# Patient Record
Sex: Female | Born: 1940 | Race: White | Hispanic: No | Marital: Married | Smoking: Former smoker
Health system: Southern US, Community
[De-identification: ages and names within clinical notes are randomized; demographics above are authoritative.]

## PROBLEM LIST (undated history)

## (undated) DIAGNOSIS — K219 Gastro-esophageal reflux disease without esophagitis: Secondary | ICD-10-CM

## (undated) DIAGNOSIS — Z9289 Personal history of other medical treatment: Secondary | ICD-10-CM

## (undated) DIAGNOSIS — M316 Other giant cell arteritis: Secondary | ICD-10-CM

## (undated) DIAGNOSIS — I2510 Atherosclerotic heart disease of native coronary artery without angina pectoris: Secondary | ICD-10-CM

## (undated) DIAGNOSIS — E785 Hyperlipidemia, unspecified: Secondary | ICD-10-CM

## (undated) DIAGNOSIS — E039 Hypothyroidism, unspecified: Secondary | ICD-10-CM

## (undated) DIAGNOSIS — H5462 Unqualified visual loss, left eye, normal vision right eye: Secondary | ICD-10-CM

## (undated) HISTORY — DX: Atherosclerotic heart disease of native coronary artery without angina pectoris: I25.10

## (undated) HISTORY — DX: Personal history of other medical treatment: Z92.89

## (undated) HISTORY — DX: Other giant cell arteritis: M31.6

## (undated) HISTORY — DX: Unqualified visual loss, left eye, normal vision right eye: H54.62

---

## 1999-03-24 ENCOUNTER — Other Ambulatory Visit: Admission: RE | Admit: 1999-03-24 | Discharge: 1999-03-24 | Payer: Self-pay | Admitting: Family Medicine

## 2001-08-26 ENCOUNTER — Ambulatory Visit (HOSPITAL_COMMUNITY): Admission: RE | Admit: 2001-08-26 | Discharge: 2001-08-26 | Payer: Self-pay | Admitting: Endocrinology

## 2002-01-06 ENCOUNTER — Other Ambulatory Visit: Admission: RE | Admit: 2002-01-06 | Discharge: 2002-01-06 | Payer: Self-pay | Admitting: Obstetrics and Gynecology

## 2002-01-08 ENCOUNTER — Encounter: Admission: RE | Admit: 2002-01-08 | Discharge: 2002-01-08 | Payer: Self-pay | Admitting: Obstetrics and Gynecology

## 2002-01-08 ENCOUNTER — Encounter: Payer: Self-pay | Admitting: Obstetrics and Gynecology

## 2005-06-15 ENCOUNTER — Ambulatory Visit: Payer: Self-pay | Admitting: Internal Medicine

## 2005-07-05 ENCOUNTER — Ambulatory Visit: Payer: Self-pay | Admitting: Internal Medicine

## 2005-09-26 ENCOUNTER — Encounter: Admission: RE | Admit: 2005-09-26 | Discharge: 2005-09-26 | Payer: Self-pay | Admitting: Obstetrics and Gynecology

## 2006-07-20 ENCOUNTER — Ambulatory Visit (HOSPITAL_COMMUNITY): Admission: RE | Admit: 2006-07-20 | Discharge: 2006-07-20 | Payer: Self-pay | Admitting: Specialist

## 2006-08-23 ENCOUNTER — Encounter: Admission: RE | Admit: 2006-08-23 | Discharge: 2006-08-23 | Payer: Self-pay | Admitting: Specialist

## 2007-03-22 ENCOUNTER — Ambulatory Visit: Payer: Self-pay | Admitting: Vascular Surgery

## 2007-11-20 ENCOUNTER — Encounter: Admission: RE | Admit: 2007-11-20 | Discharge: 2007-11-20 | Payer: Self-pay | Admitting: Endocrinology

## 2008-03-13 ENCOUNTER — Encounter: Admission: RE | Admit: 2008-03-13 | Discharge: 2008-03-13 | Payer: Self-pay | Admitting: Endocrinology

## 2008-03-23 ENCOUNTER — Encounter: Payer: Self-pay | Admitting: Family Medicine

## 2008-03-30 ENCOUNTER — Encounter: Admission: RE | Admit: 2008-03-30 | Discharge: 2008-03-30 | Payer: Self-pay | Admitting: Endocrinology

## 2008-04-19 ENCOUNTER — Encounter: Admission: RE | Admit: 2008-04-19 | Discharge: 2008-04-19 | Payer: Self-pay | Admitting: Endocrinology

## 2008-09-01 ENCOUNTER — Encounter: Admission: RE | Admit: 2008-09-01 | Discharge: 2008-09-01 | Payer: Self-pay | Admitting: Obstetrics and Gynecology

## 2009-09-27 ENCOUNTER — Encounter: Admission: RE | Admit: 2009-09-27 | Discharge: 2009-09-27 | Payer: Self-pay | Admitting: Endocrinology

## 2010-12-04 ENCOUNTER — Encounter: Payer: Self-pay | Admitting: Specialist

## 2010-12-04 ENCOUNTER — Encounter: Payer: Self-pay | Admitting: Endocrinology

## 2013-07-18 ENCOUNTER — Other Ambulatory Visit: Payer: Self-pay | Admitting: Endocrinology

## 2013-07-18 DIAGNOSIS — Z1231 Encounter for screening mammogram for malignant neoplasm of breast: Secondary | ICD-10-CM

## 2013-08-15 ENCOUNTER — Ambulatory Visit: Payer: Self-pay

## 2013-09-06 ENCOUNTER — Other Ambulatory Visit: Payer: Self-pay | Admitting: Interventional Cardiology

## 2014-06-24 ENCOUNTER — Other Ambulatory Visit: Payer: Self-pay | Admitting: Interventional Cardiology

## 2014-07-23 ENCOUNTER — Telehealth: Payer: Self-pay | Admitting: Interventional Cardiology

## 2014-07-23 NOTE — Telephone Encounter (Signed)
pt sts that she was prescribed meloxicam  daily x2wks for a pinched nerve. adv her that it was ok to take with her current cardiac medications.pt verbalized understanding.

## 2014-07-23 NOTE — Telephone Encounter (Signed)
New message    Patient calling meloxicam 15 mg . Patient was given by her PCP. Please advise if she could take it or not.

## 2014-08-22 ENCOUNTER — Other Ambulatory Visit: Payer: Self-pay | Admitting: Interventional Cardiology

## 2014-08-25 ENCOUNTER — Other Ambulatory Visit: Payer: Self-pay | Admitting: Interventional Cardiology

## 2014-09-18 ENCOUNTER — Telehealth: Payer: Self-pay

## 2014-09-18 ENCOUNTER — Other Ambulatory Visit: Payer: Self-pay | Admitting: Interventional Cardiology

## 2014-09-24 MED ORDER — ATENOLOL 25 MG PO TABS: ORAL_TABLET | ORAL | Status: DC

## 2014-09-24 NOTE — Telephone Encounter (Signed)
Pt past due for f/u with Dr.Smith appt sch for 1/12 @1 :15pm.pt adv to keep appt to continue to receive  medication refills

## 2014-11-24 ENCOUNTER — Encounter: Payer: Self-pay | Admitting: Interventional Cardiology

## 2014-11-24 ENCOUNTER — Ambulatory Visit (INDEPENDENT_AMBULATORY_CARE_PROVIDER_SITE_OTHER): Payer: Medicare Other | Admitting: Interventional Cardiology

## 2014-11-24 VITALS — BP 134/74 | HR 61 | Ht 63.0 in | Wt 198.0 lb

## 2014-11-24 DIAGNOSIS — I10 Essential (primary) hypertension: Secondary | ICD-10-CM | POA: Insufficient documentation

## 2014-11-24 DIAGNOSIS — E785 Hyperlipidemia, unspecified: Secondary | ICD-10-CM | POA: Insufficient documentation

## 2014-11-24 DIAGNOSIS — I2510 Atherosclerotic heart disease of native coronary artery without angina pectoris: Secondary | ICD-10-CM | POA: Insufficient documentation

## 2014-11-24 MED ORDER — ROSUVASTATIN CALCIUM 10 MG PO TABS: 10.0000 mg | ORAL_TABLET | Freq: Every day | ORAL | Status: DC

## 2014-11-24 NOTE — Progress Notes (Signed)
Patient ID: Jody Shaw, female   DOB: 10-01-1941, 74 y.o.   MRN: [id number]    [address], [address] [place], [place]  [address] Phone: [phone] Fax:  [phone]  Date:  11/24/2014   ID:  Jody Shaw, DOB August 18, 1941, MRN [id number]  PCP:  Julian Hy, MD   ASSESSMENT:  1.  Coronary artery disease, stable without angina  2. Essential hypertension, borderline control  3. Hyperlipidemia followed by Dr. Evlyn Kanner  PLAN:  1.  Monitor blood pressures at home an phone in results.  2. Clinical follow-up in one year  3. Blood pressure remains greater than 140/91 need to increase losartan HCT to 100/12.5 mg    SUBJECTIVE: Jody Shaw is a 74 y.o. female  who is doing well. She is somewhat distraught because her youngest grandchild who has leukemia and has now started having seizures. She denies angina. She has not had orthopnea PND. She has a blood pressure monitor at home but doesn't check her blood pressure. She denies orthopnea. No exertional related chest discomfort.    Wt Readings from Last 3 Encounters:  11/24/14 198 lb (89.812 kg)     Past Medical History  Diagnosis Date  . Coronary artery disease     Current Outpatient Prescriptions  Medication Sig Dispense Refill  . aspirin 81 MG tablet Take 81 mg by mouth 3 (three) times a week.    Marland Kitchen atenolol (TENORMIN) 25 MG tablet TAKE ONE TABLET BY MOUTH ONE TIME DAILY 30 tablet 2  . b complex vitamins capsule Take 1 capsule by mouth daily.    . isosorbide mononitrate (IMDUR) 60 MG 24 hr tablet Take 60 mg by mouth daily.    . lansoprazole (PREVACID) 15 MG capsule Take 15 mg by mouth daily at 12 noon.    Marland Kitchen levothyroxine (SYNTHROID, LEVOTHROID) 88 MCG tablet Take 88 mcg by mouth daily before breakfast.    . losartan-hydrochlorothiazide (HYZAAR) 50-12.5 MG per tablet Take 1 tablet by mouth daily.    . Multiple Vitamin (MULTI VITAMIN DAILY) TABS Take by mouth.    . nitroGLYCERIN (NITROSTAT) 0.4 MG SL tablet  Place 0.4 mg under the tongue every 5 (five) minutes as needed for chest pain.    . rosuvastatin (CRESTOR) 10 MG tablet Take 10 mg by mouth daily.    . Vitamin D, Ergocalciferol, (DRISDOL) 50000 UNITS CAPS capsule Take 50,000 Units by mouth every 7 (seven) days.     No current facility-administered medications for this visit.    Allergies:    Allergies  Allergen Reactions  . Iodine   . Septra [Sulfamethoxazole-Trimethoprim]   . Sulfa Antibiotics   . Watermelon [Citrullus Vulgaris]     Social History:  The patient  reports that she has never smoked. She does not have any smokeless tobacco history on file.   ROS:  Please see the history of present illness.    No edema. Denies orthopnea. No syncope or palpitations.   All other systems reviewed and negative.   OBJECTIVE: VS:  BP 134/74 mmHg  Pulse 61  Ht  (1.6 m)  Wt 198 lb (89.812 kg)  BMI 35.08 kg/m2 Well nourished, well developed, in no acute distress,  Mildly obese and elderly HEENT: normal Neck: JVD  flat. Carotid bruit  absent  Cardiac:  normal S1, S2; RRR; no murmur Lungs:  clear to auscultation bilaterally, no wheezing, rhonchi or rales Abd: soft, nontender, no hepatomegaly Ext: Edema  Trace bilateral. Pulses  2+ and  symmetric Skin: warm and dry Neuro:  CNs 2-12 intact, no focal abnormalities noted  EKG:   Sinus bradycardia, right bundle branch block       Signed, Darci NeedleHenry W. B. Smith III, MD 11/24/2014 1:35 PM

## 2014-11-24 NOTE — Patient Instructions (Signed)
Your physician recommends that you continue on your current medications as directed. Please refer to the Current Medication list given to you today. PLEASE MONITOR YOUR BLOOD PRESSURE AT HOME. Keep a list of daily readings, call or email Dr. Katrinka BlazingSmith in 3-4 weeks with results. If your readings are consistently greater than 140/90 Dr. Katrinka BlazingSmith may increase your blood pressure medication.

## 2014-11-25 ENCOUNTER — Telehealth: Payer: Self-pay | Admitting: Interventional Cardiology

## 2014-11-25 ENCOUNTER — Telehealth: Payer: Self-pay | Admitting: *Deleted

## 2014-11-25 NOTE — Telephone Encounter (Signed)
Patient was seen yesterday and stated that her crestor was sent in with a sig of take one daily, but she has been taking one weekly and stated that she informed the assistant of this error on her med list. She would like to know if Dr Katrinka BlazingSmith intended to change her dose.  Please advise. Thanks, MI

## 2014-11-26 NOTE — Telephone Encounter (Signed)
Returned pt call. Adv her I will fwd Dr.Smitha message for clarification of her Crestor dosage. Pt has been taking Crestor 10mg  once a wk. She sts that she has not been able to tolerate higher doses. She sts that she has had labs drawn with her pcp and was told her lipid numbers were ok. Adv her I will rqst a copy of her recent labs from her pcp, for Dr.Smith's review, I will fwd a message to Dr.Smith and call back with his response.  Spoke with Synetta FailAnita @ Dr.South's office. Pt last labs were in May 2015, she will fax them over attn:Dr.Smith

## 2014-11-27 ENCOUNTER — Encounter: Payer: Self-pay | Admitting: Interventional Cardiology

## 2014-12-01 NOTE — Telephone Encounter (Signed)
Follow up  Pt was calling to speak w/ Rn about BP - pt bought new bp kit; she said it was very painful, husband had helped her with the next reading but doesn't think it was accurate (she mentioned that 148/80 was a rough estimate) also- pt's PCP supposed to fax cholesterol levels and wanted to talk about that. Also-  She is exp[eriencing Possible sciatic nerve pain, wanted to see if that was cauasing the BP levels to inc. Please call back and discuss.

## 2014-12-01 NOTE — Telephone Encounter (Signed)
Returned pt call. Adv her that we have received a copy of her lab results from her pcp. We are waiting on Dr.Smith's response on clarification of her Crestor dosage. Pt sts that she purchased a bp machine, but does not think she was getting accurate readings because the cuff was to small. She returned the bp cuff and did not purchase another one. She was able to get one reading 148/80, but that squeeze from the cuff was unbearable. She has been having sciatic nerve pain, adv her that could be related to her elevated bp reading.adv her to monitor her bp 1-2 a wk, keep a bp log, and to call if her bp is consistently 140/90.I will call her back once I hear back from Dr.Smith about her Crestor

## 2014-12-02 ENCOUNTER — Encounter: Payer: Self-pay | Admitting: Interventional Cardiology

## 2014-12-08 MED ORDER — ROSUVASTATIN CALCIUM 10 MG PO TABS: 10.0000 mg | ORAL_TABLET | ORAL | Status: DC

## 2014-12-08 NOTE — Telephone Encounter (Signed)
Pt aware of Dr.Smith's recommendations.She needs Crestor 10 mg twice weekly. This would be a increase in therapy compared to her baseline dose. When her last laboratory evaluation was done in January her total cholesterol was 167 and the LDL was 99. By increasing the dose we should be able to get the LDL closer to 70.pt verbalized understanding.

## 2014-12-10 ENCOUNTER — Telehealth: Payer: Self-pay | Admitting: Interventional Cardiology

## 2014-12-10 NOTE — Telephone Encounter (Signed)
New message     Pt c/o BP issue: STAT if pt c/o blurred vision, one-sided weakness or slurred speech  1. What are your last 5 BP readings?151/95 checked today at [facility]  2. Are you having any other symptoms (ex. Dizziness, headache, blurred vision, passed out)? no 3. What is your BP issue? Pt had bp checked at [facility] today---the pharmacist advised pt to call her physician

## 2014-12-10 NOTE — Telephone Encounter (Signed)
Returned pt call. Pt sts that she has her bp checked today at a local pharmacy and it was 151/95. Dr.Smith aware. Adv her to continue to get her bp ck every other day, record readings, and call the office next wk to provide readings. Pt is asymptomatic and doing well. She is agreeable with plan and verbalized understanding.

## 2015-01-07 ENCOUNTER — Other Ambulatory Visit: Payer: Self-pay | Admitting: Interventional Cardiology

## 2015-03-17 ENCOUNTER — Other Ambulatory Visit: Payer: Self-pay | Admitting: Interventional Cardiology

## 2015-04-18 ENCOUNTER — Other Ambulatory Visit: Payer: Self-pay | Admitting: Interventional Cardiology

## 2015-05-24 ENCOUNTER — Other Ambulatory Visit: Payer: Self-pay | Admitting: Interventional Cardiology

## 2015-07-29 ENCOUNTER — Other Ambulatory Visit: Payer: Self-pay | Admitting: Interventional Cardiology

## 2015-08-30 ENCOUNTER — Telehealth: Payer: Self-pay | Admitting: Interventional Cardiology

## 2015-08-30 ENCOUNTER — Other Ambulatory Visit: Payer: Self-pay | Admitting: *Deleted

## 2015-08-30 MED ORDER — ATENOLOL 25 MG PO TABS: 25.0000 mg | ORAL_TABLET | Freq: Every day | ORAL | Status: DC

## 2015-08-30 NOTE — Telephone Encounter (Signed)
New message      STAT if patient is at the pharmacy , call can be transferred to refill team.   1. Which medications need to be refilled? Atenolol 25mg   2. Which pharmacy/location is medication to be sent to? [email or web address] [address] 3. Do they need a 30 day or 90 day supply? 90 day supply

## 2015-11-25 ENCOUNTER — Other Ambulatory Visit: Payer: Self-pay | Admitting: Interventional Cardiology

## 2015-11-26 ENCOUNTER — Other Ambulatory Visit: Payer: Self-pay | Admitting: *Deleted

## 2015-11-26 MED ORDER — ROSUVASTATIN CALCIUM 10 MG PO TABS: 10.0000 mg | ORAL_TABLET | ORAL | Status: DC

## 2016-01-11 ENCOUNTER — Other Ambulatory Visit: Payer: Self-pay | Admitting: Interventional Cardiology

## 2016-02-19 ENCOUNTER — Other Ambulatory Visit: Payer: Self-pay | Admitting: Interventional Cardiology

## 2016-02-25 ENCOUNTER — Telehealth: Payer: Self-pay | Admitting: Interventional Cardiology

## 2016-02-25 ENCOUNTER — Other Ambulatory Visit: Payer: Self-pay

## 2016-02-25 MED ORDER — ROSUVASTATIN CALCIUM 10 MG PO TABS: ORAL_TABLET | ORAL | Status: DC

## 2016-02-25 NOTE — Telephone Encounter (Signed)
°*  STAT* If patient is at the pharmacy, call can be transferred to refill team.   1. Which medications need to be refilled? (please list name of each medication and dose if known) Rosuvastatin 10mg   2. Which pharmacy/location (including street and city if local pharmacy) is medication to be sent to? [facility] [address]   3. Do they need a 30 day or 90 day supply? 30

## 2016-03-30 ENCOUNTER — Other Ambulatory Visit: Payer: Self-pay | Admitting: Interventional Cardiology

## 2016-04-03 ENCOUNTER — Other Ambulatory Visit: Payer: Self-pay | Admitting: Interventional Cardiology

## 2016-06-02 ENCOUNTER — Ambulatory Visit: Payer: Medicare Other | Admitting: Interventional Cardiology

## 2016-06-07 ENCOUNTER — Other Ambulatory Visit: Payer: Self-pay | Admitting: *Deleted

## 2016-06-07 MED ORDER — ROSUVASTATIN CALCIUM 10 MG PO TABS
ORAL_TABLET | ORAL | 2 refills | Status: DC
Start: 2016-06-07 — End: 2016-07-31

## 2016-07-07 ENCOUNTER — Other Ambulatory Visit: Payer: Self-pay | Admitting: Interventional Cardiology

## 2016-07-31 ENCOUNTER — Other Ambulatory Visit: Payer: Self-pay | Admitting: Interventional Cardiology

## 2016-08-11 ENCOUNTER — Encounter (INDEPENDENT_AMBULATORY_CARE_PROVIDER_SITE_OTHER): Payer: Self-pay

## 2016-08-11 ENCOUNTER — Encounter: Payer: Self-pay | Admitting: Interventional Cardiology

## 2016-08-11 ENCOUNTER — Ambulatory Visit (INDEPENDENT_AMBULATORY_CARE_PROVIDER_SITE_OTHER): Payer: Medicare Other | Admitting: Interventional Cardiology

## 2016-08-11 VITALS — BP 146/72 | HR 56 | Ht 63.0 in | Wt 186.0 lb

## 2016-08-11 DIAGNOSIS — I2510 Atherosclerotic heart disease of native coronary artery without angina pectoris: Secondary | ICD-10-CM

## 2016-08-11 DIAGNOSIS — E785 Hyperlipidemia, unspecified: Secondary | ICD-10-CM

## 2016-08-11 DIAGNOSIS — I10 Essential (primary) hypertension: Secondary | ICD-10-CM

## 2016-08-11 MED ORDER — ROSUVASTATIN CALCIUM 10 MG PO TABS: 10.0000 mg | ORAL_TABLET | ORAL | 11 refills | Status: DC

## 2016-08-11 NOTE — Progress Notes (Signed)
Cardiology Office Note    Date:  08/11/2016   ID:  Jody Shaw, DOB 12-18-1940, MRN [id number]  PCP:  Julian Hy, MD  Cardiologist: Lesleigh Noe, MD   Chief Complaint  Patient presents with  . Coronary Artery Disease    History of Present Illness:  Jody Shaw is a 75 y.o. female for follow-up of hyperlipidemia, hypertension, and coronary artery disease with prior LAD rotational atherectomy (remote).  Jody Shaw is doing well. She denies angina. She feels somewhat tired. There is no shortness of breath. She has not had palpitations or syncope. Is no peripheral edema.    Past Medical History:  Diagnosis Date  . Coronary artery disease     No past surgical history on file.  Current Medications: Outpatient Medications Prior to Visit  Medication Sig Dispense Refill  . aspirin 81 MG tablet Take 81 mg by mouth 3 (three) times a week.    Marland Kitchen atenolol (TENORMIN) 25 MG tablet TAKE 1 TABLET BY MOUTH EVERY DAY 90 tablet 0  . b complex vitamins capsule Take 1 capsule by mouth daily.    . isosorbide mononitrate (IMDUR) 60 MG 24 hr tablet Take 60 mg by mouth daily.    . lansoprazole (PREVACID) 15 MG capsule Take 15 mg by mouth daily at 12 noon.    Marland Kitchen levothyroxine (SYNTHROID, LEVOTHROID) 88 MCG tablet Take 88 mcg by mouth daily before breakfast.    . losartan-hydrochlorothiazide (HYZAAR) 50-12.5 MG per tablet Take 1 tablet by mouth daily.    . Multiple Vitamin (MULTI VITAMIN DAILY) TABS Take by mouth.    . nitroGLYCERIN (NITROSTAT) 0.4 MG SL tablet Place 0.4 mg under the tongue every 5 (five) minutes as needed for chest pain.    . rosuvastatin (CRESTOR) 10 MG tablet Take 1 tablet (10 mg total) by mouth 2 (two) times a week. 10 tablet 0  . Vitamin D, Ergocalciferol, (DRISDOL) 50000 UNITS CAPS capsule Take 50,000 Units by mouth every 7 (seven) days.     No facility-administered medications prior to visit.      Allergies:   Iodine; Septra  [sulfamethoxazole-trimethoprim]; Sulfa antibiotics; and Watermelon [citrullus vulgaris]   Social History   Social History  . Marital status: Married    Spouse name: N/A  . Number of children: N/A  . Years of education: N/A   Social History Main Topics  . Smoking status: Former Games developer  . Smokeless tobacco: Never Used  . Alcohol use None  . Drug use: Unknown  . Sexual activity: Not Asked   Other Topics Concern  . None   Social History Narrative  . None     Family History:  The patient's family history includes Heart disease in her father and mother.   ROS:   Please see the history of present illness.    Decreased hearing, left eye vision disturbance after cataract surgery. Recurring urinary tract infections. History of recent UTI. Back pain and muscle spasms. History of left leg sciatica.  All other systems reviewed and are negative.   PHYSICAL EXAM:   VS:  BP (!) 146/72   Pulse (!) 56   Ht 5\' 3"  (1.6 m)   Wt 186 lb (84.4 kg)   BMI 32.95 kg/m    GEN: Well nourished, well developed, in no acute distress. Mild obesity.  HEENT: normal  Neck: no JVD, carotid bruits, or masses Cardiac: RRR; no murmurs, rubs, or gallops,no edema  Respiratory:  clear to auscultation bilaterally, normal work of breathing  GI: soft, nontender, nondistended, + BS MS: no deformity or atrophy  Skin: warm and dry, no rash Neuro:  Alert and Oriented x 3, Strength and sensation are intact Psych: euthymic mood, full affect  Wt Readings from Last 3 Encounters:  08/11/16 186 lb (84.4 kg)  11/24/14 198 lb (89.8 kg)      Studies/Labs Reviewed:  * EKG:  EKG  Normal sinus rhythm, right bundle branch block, when compared to prior tracing, no significant changes occurred.  Recent Labs: No results found for requested labs within last 8760 hours.   Lipid Panel No results found for: CHOL, TRIG, HDL, CHOLHDL, VLDL, LDLCALC, LDLDIRECT  Additional studies/ records that were reviewed today include:    No new data    ASSESSMENT:    1. Essential hypertension   2. CAD in native artery   3. Hyperlipidemia      PLAN:  In order of problems listed above:  1. Borderline blood pressure control. Low-salt diet advocate it. Continue on Hyzaar 50/12.5 mg. Blood pressure remains elevated, we will need to up titrate losartan to 100/12.5 mg daily. 2. Continue isosorbide mononitrate. She has not ED use nitroglycerin. Aerobic exercise is recommended. 3. Monitor by Dr. Evlyn KannerSouth. Re-right the prescription for rosuvastatin 10 mg 3 times per week. No blood work is available in [employer].    Medication Adjustments/Labs and Tests Ordered: Current medicines are reviewed at length with the patient today.  Concerns regarding medicines are outlined above.  Medication changes, Labs and Tests ordered today are listed in the Patient Instructions below. There are no Patient Instructions on file for this visit.   Signed, Lesleigh NoeHenry W Smith III, MD  08/11/2016 12:25 PM    [facility] [address], [place], [place]  [address] Phone: [phone]) [phone]; Fax: [phone]

## 2016-08-11 NOTE — Patient Instructions (Signed)

## 2016-10-02 ENCOUNTER — Other Ambulatory Visit: Payer: Self-pay | Admitting: Interventional Cardiology

## 2017-07-06 ENCOUNTER — Other Ambulatory Visit: Payer: Self-pay | Admitting: Interventional Cardiology

## 2017-07-31 ENCOUNTER — Emergency Department (HOSPITAL_COMMUNITY): Payer: Medicare Other

## 2017-07-31 ENCOUNTER — Encounter (HOSPITAL_COMMUNITY): Payer: Self-pay | Admitting: Emergency Medicine

## 2017-07-31 ENCOUNTER — Inpatient Hospital Stay (HOSPITAL_COMMUNITY): Payer: Medicare Other

## 2017-07-31 ENCOUNTER — Inpatient Hospital Stay (HOSPITAL_COMMUNITY)
Admission: EM | Admit: 2017-07-31 | Discharge: 2017-08-04 | DRG: 517 | Disposition: A | Discharge status: Home / Self Care | Payer: Medicare Other | Attending: Internal Medicine | Admitting: Internal Medicine

## 2017-07-31 DIAGNOSIS — E039 Hypothyroidism, unspecified: Secondary | ICD-10-CM | POA: Diagnosis present

## 2017-07-31 DIAGNOSIS — E785 Hyperlipidemia, unspecified: Secondary | ICD-10-CM | POA: Diagnosis present

## 2017-07-31 DIAGNOSIS — Z91018 Allergy to other foods: Secondary | ICD-10-CM | POA: Diagnosis not present

## 2017-07-31 DIAGNOSIS — I10 Essential (primary) hypertension: Secondary | ICD-10-CM | POA: Diagnosis present

## 2017-07-31 DIAGNOSIS — I739 Peripheral vascular disease, unspecified: Secondary | ICD-10-CM | POA: Diagnosis present

## 2017-07-31 DIAGNOSIS — Z8249 Family history of ischemic heart disease and other diseases of the circulatory system: Secondary | ICD-10-CM | POA: Diagnosis not present

## 2017-07-31 DIAGNOSIS — M316 Other giant cell arteritis: Secondary | ICD-10-CM | POA: Diagnosis present

## 2017-07-31 DIAGNOSIS — Z87891 Personal history of nicotine dependence: Secondary | ICD-10-CM | POA: Diagnosis not present

## 2017-07-31 DIAGNOSIS — R1310 Dysphagia, unspecified: Secondary | ICD-10-CM | POA: Diagnosis not present

## 2017-07-31 DIAGNOSIS — K219 Gastro-esophageal reflux disease without esophagitis: Secondary | ICD-10-CM

## 2017-07-31 DIAGNOSIS — Z888 Allergy status to other drugs, medicaments and biological substances status: Secondary | ICD-10-CM

## 2017-07-31 DIAGNOSIS — H5462 Unqualified visual loss, left eye, normal vision right eye: Secondary | ICD-10-CM | POA: Diagnosis present

## 2017-07-31 DIAGNOSIS — Z7982 Long term (current) use of aspirin: Secondary | ICD-10-CM

## 2017-07-31 DIAGNOSIS — Z882 Allergy status to sulfonamides status: Secondary | ICD-10-CM | POA: Diagnosis not present

## 2017-07-31 DIAGNOSIS — E876 Hypokalemia: Secondary | ICD-10-CM | POA: Diagnosis present

## 2017-07-31 DIAGNOSIS — I2510 Atherosclerotic heart disease of native coronary artery without angina pectoris: Secondary | ICD-10-CM | POA: Diagnosis present

## 2017-07-31 HISTORY — DX: Hypothyroidism, unspecified: E03.9

## 2017-07-31 HISTORY — DX: Gastro-esophageal reflux disease without esophagitis: K21.9

## 2017-07-31 HISTORY — DX: Hyperlipidemia, unspecified: E78.5

## 2017-07-31 LAB — URINALYSIS, ROUTINE W REFLEX MICROSCOPIC
Bilirubin Urine: NEGATIVE
GLUCOSE, UA: NEGATIVE mg/dL
Ketones, ur: NEGATIVE mg/dL
Leukocytes, UA: NEGATIVE
NITRITE: NEGATIVE
PH: 6 (ref 5.0–8.0)
Protein, ur: NEGATIVE mg/dL
Specific Gravity, Urine: 1.008 (ref 1.005–1.030)

## 2017-07-31 LAB — COMPREHENSIVE METABOLIC PANEL
ALT: 19 U/L (ref 14–54)
AST: 29 U/L (ref 15–41)
Albumin: 3.4 g/dL — ABNORMAL LOW (ref 3.5–5.0)
Alkaline Phosphatase: 72 U/L (ref 38–126)
Anion gap: 12 (ref 5–15)
BILIRUBIN TOTAL: 0.4 mg/dL (ref 0.3–1.2)
BUN: 23 mg/dL — AB (ref 6–20)
CHLORIDE: 103 mmol/L (ref 101–111)
CO2: 22 mmol/L (ref 22–32)
CREATININE: 0.98 mg/dL (ref 0.44–1.00)
Calcium: 9.1 mg/dL (ref 8.9–10.3)
GFR calc Af Amer: >60 mL/min (ref >60)
GFR, EST NON AFRICAN AMERICAN: 54 mL/min — AB (ref >60)
GLUCOSE: 133 mg/dL — AB (ref 65–99)
Potassium: 3.4 mmol/L — ABNORMAL LOW (ref 3.5–5.1)
Sodium: 137 mmol/L (ref 135–145)
TOTAL PROTEIN: 8.3 g/dL — AB (ref 6.5–8.1)

## 2017-07-31 LAB — CBC WITH DIFFERENTIAL/PLATELET
Basophils Absolute: 0 10*3/uL (ref 0.0–0.1)
Basophils Relative: 0 %
Eosinophils Absolute: 0 10*3/uL (ref 0.0–0.7)
Eosinophils Relative: 0 %
HEMATOCRIT: 32.3 % — AB (ref 36.0–46.0)
Hemoglobin: 10.9 g/dL — ABNORMAL LOW (ref 12.0–15.0)
Lymphocytes Relative: 4 %
Lymphs Abs: 0.5 10*3/uL — ABNORMAL LOW (ref 0.7–4.0)
MCH: 27.3 pg (ref 26.0–34.0)
MCHC: 33.7 g/dL (ref 30.0–36.0)
MCV: 80.8 fL (ref 78.0–100.0)
Monocytes Absolute: 0.3 10*3/uL (ref 0.1–1.0)
Monocytes Relative: 3 %
NEUTROS ABS: 10.4 10*3/uL — AB (ref 1.7–7.7)
NEUTROS PCT: 93 %
Platelets: 398 10*3/uL (ref 150–400)
RBC: 4 MIL/uL (ref 3.87–5.11)
RDW: 14.3 % (ref 11.5–15.5)
WBC: 11.2 10*3/uL — AB (ref 4.0–10.5)

## 2017-07-31 LAB — SEDIMENTATION RATE: Sed Rate: 120 mm/hr — ABNORMAL HIGH (ref 0–22)

## 2017-07-31 LAB — I-STAT TROPONIN, ED: TROPONIN I, POC: 0 ng/mL (ref 0.00–0.08)

## 2017-07-31 LAB — C-REACTIVE PROTEIN: CRP: 2.9 mg/dL — AB (ref <1.0)

## 2017-07-31 MED ORDER — ADULT MULTIVITAMIN W/MINERALS CH
1.0000 | ORAL_TABLET | Freq: Every day | ORAL | Status: DC
Start: 2017-08-01 — End: 2017-08-04
  Administered 2017-08-01 – 2017-08-04 (×4): 1 via ORAL
  Filled 2017-07-31 (×4): qty 1

## 2017-07-31 MED ORDER — LOSARTAN POTASSIUM 50 MG PO TABS
50.0000 mg | ORAL_TABLET | Freq: Every day | ORAL | Status: DC
  Administered 2017-08-01 – 2017-08-04 (×4): 50 mg via ORAL
  Filled 2017-07-31 (×4): qty 1

## 2017-07-31 MED ORDER — LEVOTHYROXINE SODIUM 88 MCG PO TABS
88.0000 ug | ORAL_TABLET | Freq: Every day | ORAL | Status: DC
  Administered 2017-08-01: 88 ug via ORAL
  Filled 2017-07-31: qty 1

## 2017-07-31 MED ORDER — HYDROCHLOROTHIAZIDE 12.5 MG PO CAPS
12.5000 mg | ORAL_CAPSULE | Freq: Every day | ORAL | Status: DC
  Administered 2017-08-01 – 2017-08-04 (×4): 12.5 mg via ORAL
  Filled 2017-07-31 (×4): qty 1

## 2017-07-31 MED ORDER — ROSUVASTATIN CALCIUM 10 MG PO TABS
10.0000 mg | ORAL_TABLET | ORAL | Status: DC
  Administered 2017-08-02: 10 mg via ORAL
  Filled 2017-07-31: qty 1

## 2017-07-31 MED ORDER — ZOLPIDEM TARTRATE 5 MG PO TABS: 5.0000 mg | ORAL_TABLET | Freq: Every evening | ORAL | Status: DC | PRN

## 2017-07-31 MED ORDER — SODIUM CHLORIDE 0.9 % IV SOLN
250.0000 mg | Freq: Four times a day (QID) | INTRAVENOUS | Status: AC
  Administered 2017-07-31 – 2017-08-03 (×13): 250 mg via INTRAVENOUS
  Filled 2017-07-31 (×17): qty 2

## 2017-07-31 MED ORDER — NITROGLYCERIN 0.4 MG SL SUBL: 0.4000 mg | SUBLINGUAL_TABLET | SUBLINGUAL | Status: DC | PRN

## 2017-07-31 MED ORDER — ENOXAPARIN SODIUM 40 MG/0.4ML ~~LOC~~ SOLN
40.0000 mg | SUBCUTANEOUS | Status: DC
  Administered 2017-08-01 – 2017-08-04 (×3): 40 mg via SUBCUTANEOUS
  Filled 2017-07-31 (×3): qty 0.4

## 2017-07-31 MED ORDER — B COMPLEX VITAMINS PO CAPS: 1.0000 | ORAL_CAPSULE | Freq: Every day | ORAL | Status: DC

## 2017-07-31 MED ORDER — ASPIRIN 81 MG PO CHEW
81.0000 mg | CHEWABLE_TABLET | ORAL | Status: DC
  Administered 2017-08-01 – 2017-08-03 (×3): 81 mg via ORAL
  Filled 2017-07-31 (×3): qty 1

## 2017-07-31 MED ORDER — ACETAMINOPHEN 325 MG PO TABS
650.0000 mg | ORAL_TABLET | Freq: Four times a day (QID) | ORAL | Status: DC | PRN
  Administered 2017-08-03: 650 mg via ORAL
  Filled 2017-07-31: qty 2

## 2017-07-31 MED ORDER — B COMPLEX-C PO TABS
1.0000 | ORAL_TABLET | Freq: Every day | ORAL | Status: DC
  Administered 2017-08-01 – 2017-08-04 (×4): 1 via ORAL
  Filled 2017-07-31 (×4): qty 1

## 2017-07-31 MED ORDER — ONDANSETRON HCL 4 MG/2ML IJ SOLN: 4.0000 mg | Freq: Three times a day (TID) | INTRAMUSCULAR | Status: DC | PRN

## 2017-07-31 MED ORDER — PANTOPRAZOLE SODIUM 20 MG PO TBEC
20.0000 mg | DELAYED_RELEASE_TABLET | Freq: Every day | ORAL | Status: DC
  Administered 2017-08-01 – 2017-08-03 (×3): 20 mg via ORAL
  Filled 2017-07-31 (×3): qty 1

## 2017-07-31 MED ORDER — HYDRALAZINE HCL 20 MG/ML IJ SOLN: 5.0000 mg | INTRAMUSCULAR | Status: DC | PRN

## 2017-07-31 MED ORDER — SODIUM CHLORIDE 0.9 % IV SOLN
INTRAVENOUS | Status: DC
  Administered 2017-08-01: 01:00:00 via INTRAVENOUS

## 2017-07-31 MED ORDER — LORAZEPAM 2 MG/ML IJ SOLN
1.0000 mg | Freq: Once | INTRAMUSCULAR | Status: AC
  Administered 2017-07-31: 1 mg via INTRAVENOUS
  Filled 2017-07-31: qty 1

## 2017-07-31 MED ORDER — ATENOLOL 25 MG PO TABS
25.0000 mg | ORAL_TABLET | Freq: Every day | ORAL | Status: DC
Start: 2017-08-01 — End: 2017-08-04
  Administered 2017-08-01 – 2017-08-04 (×4): 25 mg via ORAL
  Filled 2017-07-31 (×4): qty 1

## 2017-07-31 MED ORDER — ISOSORBIDE MONONITRATE ER 60 MG PO TB24
60.0000 mg | ORAL_TABLET | Freq: Every day | ORAL | Status: DC
  Administered 2017-08-01 – 2017-08-04 (×4): 60 mg via ORAL
  Filled 2017-07-31 (×4): qty 1

## 2017-07-31 MED ORDER — POTASSIUM CHLORIDE 20 MEQ/15ML (10%) PO SOLN
20.0000 meq | Freq: Once | ORAL | Status: AC
  Administered 2017-08-01: 20 meq via ORAL
  Filled 2017-07-31: qty 15

## 2017-07-31 MED ORDER — LOSARTAN POTASSIUM-HCTZ 50-12.5 MG PO TABS: 1.0000 | ORAL_TABLET | Freq: Every day | ORAL | Status: DC

## 2017-07-31 NOTE — ED Notes (Signed)
Call report to [facility] [phone] @ [phone]

## 2017-07-31 NOTE — H&P (Signed)
History and Physical    TENLEIGH BYER MVE:[id number] DOB: 12/07/1940 DOA: 07/31/2017  Referring MD/NP/PA:   PCP: Reynold Bowen, MD   Patient coming from:  The patient is coming from home.  At baseline, pt is independent for most of ADL.   Chief Complaint: vision loss, difficult swallowing  HPI: Jody Shaw is a 76 y.o. female with medical history significant of hypertension, hyperlipidemia, GERD, hypothyroidism, CAD, who presents with vision loss and difficult swallowing.  Pt states that she stared having blurry vision in left eye on Saturday and which has been progressively getting worse. She lost most of her vision in left eye now. She states that she can only see a little light in left eye. She is not sure if she has vision loss in the right eye, but saying that she does not feel vision loss in right eye. Patient went to see Dr. Wyatt Portela from ophthomology yesterday and had a dilated eye exam and was concerned for possible giant cell arteritis. Pt had ESR and CRP tests with pending results. Patient was started on steroids and took 80 mg yesterday and 40 mg of prednisone today. Pt also reports mild occipital headache. She states that she has mild difficult swallowing, poor balance and left jaw pain. No hearing loss, slurred speech, facial droop, unilateral weakness, numbness or tingliness in extremities. Patient does not have chest pain, shortness breath, fever or chills. She has mild dry cough. Patient has nausea, but no vomiting, diarrhea or abdominal pain. She reports increased urinary frequency sometimes.  Dr. Katy Fitch can be reached at [phone].  ED Course: pt was found to have WBC 11.2, negative troponin, pending urinalysis, potassium 3.4, creatinine normal, temperature normal, no tachycardia, oxygen saturation 98% on room air. ESR 120.  Pending CT head. Patient is admitted to telemetry bed as inpatient.   Review of Systems:   General: no fevers, chills, no body weight gain,  has fatigue and HA. HEENT: no hearing changes or sore throat. Has vision loss. Respiratory: no dyspnea, coughing, wheezing CV: no chest pain, no palpitations GI: has nausea, no vomiting, abdominal pain, diarrhea, constipation GU: no dysuria, burning on urination, has increased urinary frequency, no hematuria  Ext: no leg edema Neuro: no unilateral weakness, numbness, or tingling, No hearing loss. Has difficulty swallowing, poor balance and vision loss. Skin: no rash, no skin tear. MSK: No muscle spasm, no deformity, no limitation of range of movement in spin Heme: No easy bruising.  Travel history: No recent long distant travel.  Allergy:  Allergies  Allergen Reactions  . Iodine   . Septra [Sulfamethoxazole-Trimethoprim]   . Sulfa Antibiotics   . Watermelon [Citrullus Vulgaris]     Past Medical History:  Diagnosis Date  . Coronary artery disease   . GERD (gastroesophageal reflux disease)   . HLD (hyperlipidemia)   . Hypothyroidism     History reviewed. No pertinent surgical history.  Social History:  reports that she has quit smoking. She has never used smokeless tobacco. She reports that she does not drink alcohol or use drugs.  Family History:  Family History  Problem Relation Age of Onset  . Heart disease Mother   . Heart disease Father      Prior to Admission medications   Medication Sig Start Date End Date Taking? Authorizing Provider  aspirin 81 MG tablet Take 81 mg by mouth 3 (three) times a week.    [provider]  atenolol (TENORMIN) 25 MG tablet Take 1 tablet (25  mg total) by mouth daily. Please call and schedule a one year follow up appointment 07/06/17   Belva Crome, MD  b complex vitamins capsule Take 1 capsule by mouth daily.    [provider]  cephALEXin (KEFLEX) 250 MG capsule Take 250 mg by mouth 2 (two) times daily. 08/08/16   [provider]  isosorbide mononitrate (IMDUR) 60 MG 24 hr tablet Take 60 mg by mouth daily.     [provider]  lansoprazole (PREVACID) 15 MG capsule Take 15 mg by mouth daily at 12 noon.    [provider]  levothyroxine (SYNTHROID, LEVOTHROID) 88 MCG tablet Take 88 mcg by mouth daily before breakfast.    [provider]  losartan-hydrochlorothiazide (HYZAAR) 50-12.5 MG per tablet Take 1 tablet by mouth daily.    [provider]  Multiple Vitamin (MULTI VITAMIN DAILY) TABS Take by mouth.    [provider]  nitroGLYCERIN (NITROSTAT) 0.4 MG SL tablet Place 0.4 mg under the tongue every 5 (five) minutes as needed for chest pain.    [provider]  rosuvastatin (CRESTOR) 10 MG tablet Take 1 tablet (10 mg total) by mouth 2 (two) times a week. 08/14/16   Belva Crome, MD  Vitamin D, Ergocalciferol, (DRISDOL) 50000 UNITS CAPS capsule Take 50,000 Units by mouth every 7 (seven) days.    [provider]    Physical Exam: Vitals:   07/31/17 1855 07/31/17 2042  BP: (!) 161/108 140/71  Pulse: 85 69  Resp: 18 14  Temp: 98 F (36.7 C)   TempSrc: Oral   SpO2: 100% 98%   General: Not in acute distress HEENT:       Eyes: PERRL and EOMI in right eye, no scleral icterus.       ENT: No discharge from the ears and nose, no pharynx injection, no tonsillar enlargement.        Neck: No JVD, no bruit, no mass felt. Heme: No neck lymph node enlargement. Cardiac: S1/S2, RRR, No murmurs, No gallops or rubs. Respiratory: No rales, wheezing, rhonchi or rubs. GI: Soft, nondistended, nontender, no rebound pain, no organomegaly, BS present. GU: No hematuria Ext: No pitting leg edema bilaterally. 2+DP/PT pulse bilaterally. Musculoskeletal: No joint deformities, No joint redness or warmth, no limitation of ROM in spin. Skin: No rashes.  Neuro: Alert, oriented X3, cranial nerves II-XII grossly intact except for left eye vision loss, moves all extremities normally. Muscle strength 5/5 in all extremities, sensation to light touch intact. Brachial  reflex 2+ bilaterally. Negative Babinski's sign. Normal finger to nose test. Psych: Patient is not psychotic, no suicidal or hemocidal ideation.  Labs on Admission: I have personally reviewed following labs and imaging studies  CBC:  Recent Labs Lab 07/31/17 1928  WBC 11.2*  NEUTROABS 10.4*  HGB 10.9*  HCT 32.3*  MCV 80.8  PLT 314   Basic Metabolic Panel:  Recent Labs Lab 07/31/17 1928  NA 137  K 3.4*  CL 103  CO2 22  GLUCOSE 133*  BUN 23*  CREATININE 0.98  CALCIUM 9.1   GFR: CrCl cannot be calculated (Unknown ideal weight.). Liver Function Tests:  Recent Labs Lab 07/31/17 1928  AST 29  ALT 19  ALKPHOS 72  BILITOT 0.4  PROT 8.3*  ALBUMIN 3.4*   No results for input(s): LIPASE, AMYLASE in the last 168 hours. No results for input(s): AMMONIA in the last 168 hours. Coagulation Profile: No results for input(s): INR, PROTIME in the last 168 hours.  Cardiac Enzymes: No results for input(s): CKTOTAL, CKMB, CKMBINDEX, TROPONINI in the last 168 hours. BNP (last 3 results) No results for input(s): PROBNP in the last 8760 hours. HbA1C: No results for input(s): HGBA1C in the last 72 hours. CBG: No results for input(s): GLUCAP in the last 168 hours. Lipid Profile: No results for input(s): CHOL, HDL, LDLCALC, TRIG, CHOLHDL, LDLDIRECT in the last 72 hours. Thyroid Function Tests: No results for input(s): TSH, T4TOTAL, FREET4, T3FREE, THYROIDAB in the last 72 hours. Anemia Panel: No results for input(s): VITAMINB12, FOLATE, FERRITIN, TIBC, IRON, RETICCTPCT in the last 72 hours. Urine analysis: No results found for: COLORURINE, APPEARANCEUR, LABSPEC, PHURINE, GLUCOSEU, HGBUR, BILIRUBINUR, KETONESUR, PROTEINUR, UROBILINOGEN, NITRITE, LEUKOCYTESUR Sepsis Labs: '@LABRCNTIP' (procalcitonin:4,lacticidven:4) )No results found for this or any previous visit (from the past 240 hour(s)).   Radiological Exams on Admission: Ct Head Wo Contrast  Result Date: 07/31/2017 CLINICAL  DATA:  Visual changes for the last 3 days, worsening. EXAM: CT HEAD WITHOUT CONTRAST TECHNIQUE: Contiguous axial images were obtained from the base of the skull through the vertex without intravenous contrast. COMPARISON:  None. FINDINGS: Brain: Ventricles are normal in size and configuration. There is no mass, hemorrhage, edema or other evidence of acute parenchymal abnormality. No extra-axial hemorrhage. Vascular: There are chronic calcified atherosclerotic changes of the large vessels at the skull base. No unexpected hyperdense vessel. Skull: Normal. Negative for fracture or focal lesion. Sinuses/Orbits: No acute finding. Other: None. IMPRESSION: Negative head CT. No intracranial mass, hemorrhage or edema. Periorbital and retro-orbital soft tissues are unremarkable. Electronically Signed   By: Franki Cabot M.D.   On: 07/31/2017 21:40     EKG:  Not done in ED, will get one.   Assessment/Plan Principal Problem:   Vision loss of left eye Active Problems:   Essential hypertension   CAD in native artery   Hyperlipidemia   HLD (hyperlipidemia)   GERD (gastroesophageal reflux disease)   Hypothyroidism   Difficulty swallowing   Hypokalemia   Vision loss of left eye: I spoke with ophthalmologist, Dr. 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 He recommended to start the patient with Solu-Medrol 250 mg every 6 hours. ESR in ED is elevated at 120 which is consistent with this diagnosis. Dr. Wyatt Portela will come to see patient again tomorrow. Since pt also reports difficulty swallowing and poor balance, will also need to r/o stroke.  -will admit to pt to tele bed as inpt. -highly appreciate Dr. Zenia Resides recommendations -f/u CRP -Solu-Medrol 250 mg every 6 hours -visual acuity screen -swallowing screen. -f/u CT-head-->if negative. Will get MRI of brain (pt states that she had Shrapnel injury to her left side of head in the war as  child, will need to r/o any pieces of metal in head on CT-scan before ordering MRI).  HTN: -will continue her atenolol, Hyzaar -IV hydralazine when necessary  CAD in native artery: No CP -continue home prn NTG -Aspirin, Crestor, atenolol and Imdur  HLD: -crestor  GERD: -Protonix  Hypokalemia: K= 3.4 on admission. - Repleted  Hypothyroidism: Last TSH was not on record -Continue home Synthroid -Check TSH   DVT ppx: SQ Lovenox Code Status: Full code Family Communication: Yes, patient's husband at bed side Disposition Plan:  Anticipate discharge back to previous home environment Consults called:  none Admission status: Inpatient/tele    Date of Service 07/31/2017    Jalysa Swopes, [facility] Pager [phone]  If 7PM-7AM, please contact night-coverage  [email or web address] Password [facility] 07/31/2017, 9:51 PM

## 2017-07-31 NOTE — ED Notes (Signed)
Visual Acuity Screening  Right- 20/40 Left- No vision whatsoever. Patient states " I see just a bit of light and a shadow." Bilateral - 20/40

## 2017-07-31 NOTE — ED Notes (Signed)
Patient unable to give urine specimen, patient states she voided 10 mins. ago

## 2017-07-31 NOTE — ED Provider Notes (Signed)
[facility] DEPT Provider Note   CSN: [id number] Arrival date & time: 07/31/17  1815     History   Chief Complaint Chief Complaint  Patient presents with  . Loss of Vision    HPI Jody Shaw is a 76 y.o. female history of coronary artery disease, hypertension here presenting with blurry vision, jaw claudication. Patient states that she has had left eye blurry vision for the last 3 days. Patient went to see Dr. 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 Patient was started on steroids and took 80 mg yesterday and 40 mg of prednisone today. Patient states that her blurry vision has gotten worse and now she has right eye blurry vision as well and the jaw of claudication has worse. Patient denies any chest pain or shortness of breath. Patient called Dr. Forde Dandy, her PCP, who discussed with Dr. Katy Fitch and recommend admission for IV solumedrol.   The history is provided by the patient.    Past Medical History:  Diagnosis Date  . Coronary artery disease     Patient Active Problem List   Diagnosis Date Noted  . Essential hypertension 11/24/2014  . CAD in native artery 11/24/2014  . Hyperlipidemia 11/24/2014    History reviewed. No pertinent surgical history.  OB History    No data available       Home Medications    Prior to Admission medications   Medication Sig Start Date End Date Taking? Authorizing Provider  aspirin 81 MG tablet Take 81 mg by mouth 3 (three) times a week.    [provider]  atenolol (TENORMIN) 25 MG tablet Take 1 tablet (25 mg total) by mouth daily. Please call and schedule a one year follow up appointment 07/06/17   Belva Crome, MD  b complex vitamins capsule Take 1 capsule by mouth daily.    [provider]  cephALEXin (KEFLEX) 250 MG capsule Take 250 mg by mouth 2 (two) times daily. 08/08/16   [provider]  isosorbide mononitrate (IMDUR) 60 MG 24  hr tablet Take 60 mg by mouth daily.    [provider]  lansoprazole (PREVACID) 15 MG capsule Take 15 mg by mouth daily at 12 noon.    [provider]  levothyroxine (SYNTHROID, LEVOTHROID) 88 MCG tablet Take 88 mcg by mouth daily before breakfast.    [provider]  losartan-hydrochlorothiazide (HYZAAR) 50-12.5 MG per tablet Take 1 tablet by mouth daily.    [provider]  Multiple Vitamin (MULTI VITAMIN DAILY) TABS Take by mouth.    [provider]  nitroGLYCERIN (NITROSTAT) 0.4 MG SL tablet Place 0.4 mg under the tongue every 5 (five) minutes as needed for chest pain.    [provider]  rosuvastatin (CRESTOR) 10 MG tablet Take 1 tablet (10 mg total) by mouth 2 (two) times a week. 08/14/16   Belva Crome, MD  Vitamin D, Ergocalciferol, (DRISDOL) 50000 UNITS CAPS capsule Take 50,000 Units by mouth every 7 (seven) days.    [provider]    Family History Family History  Problem Relation Age of Onset  . Heart disease Mother   . Heart disease Father     Social History Social History  Substance Use Topics  . Smoking status: Former Research scientist (life sciences)  . Smokeless tobacco: Never Used  . Alcohol use Not on file     Allergies   Iodine; Septra [sulfamethoxazole-trimethoprim]; Sulfa antibiotics; and Watermelon [citrullus  vulgaris]   Review of Systems Review of Systems   Physical Exam Updated Vital Signs BP 140/71   Pulse 69   Temp 98 F (36.7 C) (Oral)   Resp 14   SpO2 98%   Physical Exam  Constitutional: She is oriented to person, place, and time.  Chronically ill   HENT:  Head: Normocephalic.  Mouth/Throat: Oropharynx is clear and moist.  Eyes: Pupils are equal, round, and reactive to light. Conjunctivae and EOM are normal.  Neck: Normal range of motion. Neck supple.  Cardiovascular: Normal rate, regular rhythm and normal heart sounds.   Pulmonary/Chest: Effort normal and breath sounds normal. No respiratory  distress. She has no rales.  Abdominal: Soft. Bowel sounds are normal. She exhibits no distension. There is no tenderness. There is no guarding.  Musculoskeletal: Normal range of motion.  Neurological: She is alert and oriented to person, place, and time. No cranial nerve deficit. Coordination normal.  CN 2-12 intact, nl strength and sensation throughout   Skin: Skin is warm.  Psychiatric: She has a normal mood and affect.  Nursing note and vitals reviewed.    ED Treatments / Results  Labs (all labs ordered are listed, but only abnormal results are displayed) Labs Reviewed  CBC WITH DIFFERENTIAL/PLATELET - Abnormal; Notable for the following:       Result Value   WBC 11.2 (*)    Hemoglobin 10.9 (*)    HCT 32.3 (*)    Neutro Abs 10.4 (*)    Lymphs Abs 0.5 (*)    All other components within normal limits  COMPREHENSIVE METABOLIC PANEL - Abnormal; Notable for the following:    Potassium 3.4 (*)    Glucose, Bld 133 (*)    BUN 23 (*)    Total Protein 8.3 (*)    Albumin 3.4 (*)    GFR calc non Af Amer 54 (*)    All other components within normal limits  SEDIMENTATION RATE  C-REACTIVE PROTEIN  I-STAT TROPONIN, ED    EKG  EKG Interpretation None       Radiology No results found.  Procedures Procedures (including critical care time)  Medications Ordered in ED Medications  methylPREDNISolone sodium succinate (SOLU-MEDROL) 250 mg in sodium chloride 0.9 % 50 mL IVPB (250 mg Intravenous New Bag/Given 07/31/17 2043)     Initial Impression / Assessment and Plan / ED Course  I have reviewed the triage vital signs and the nursing notes.  Pertinent labs & imaging results that were available during my care of the patient were reviewed by me and considered in my medical decision making (see chart for details).     Jody Shaw is a 76 y.o. female here with blurry vision, jaw claudication. No obvious temporal tenderness. Nl neuro exam. Seen by Dr. Katy Fitch yesterday. I called  Dr. Katy Fitch and discussed case. Recommend ESR, CRP, solumedrol 250 mg every 6 hrs. He will see patient tomorrow evening. Low suspicion for stroke. ESR 120 in the ED. Given solumedrol. Hospitalist to admit.    Final Clinical Impressions(s) / ED Diagnoses   Final diagnoses:  Vision loss of left eye    New Prescriptions New Prescriptions   No medications on file     Drenda Freeze, MD 07/31/17 2105

## 2017-07-31 NOTE — ED Triage Notes (Signed)
Pt lost vision in lt eye on Saturday.  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  They are concerned about her losing vision in the other eye.

## 2017-07-31 NOTE — ED Notes (Signed)
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  Pt was started on high dose steroids x 1 day ago.  C/o worsening headache and jaw issue.    Pt is followed by Fabian Sharp- Opthalmologist.  He recommends Solumedrol  Q6h.  He can be reached at [phone].

## 2017-07-31 NOTE — ED Notes (Signed)
Patient passed stroke swallow screen. °

## 2017-07-31 NOTE — ED Notes (Signed)
ED TO INPATIENT HANDOFF REPORT  Name/Age/Gender Jody Shaw 76 y.o. female  Code Status    Code Status Orders        Start     Ordered   07/31/17 2155  Full code  Continuous     07/31/17 2155    Code Status History    Date Active Date Inactive Code Status Order ID Comments User Context   This patient has a current code status but no historical code status.    Advance Directive Documentation     Most Recent Value  Type of Advance Directive  Healthcare Power of Attorney, Living will  Pre-existing out of facility DNR order (yellow form or pink MOST form)  -  "MOST" Form in Place?  -      Home/SNF/Other Home  Chief Complaint sent by dr for IV steriods  Level of Care/Admitting Diagnosis ED Disposition    ED Disposition Condition [place]: [place] [[id number]]  Level of Care: Telemetry [5]  Admit to tele based on following criteria: Other see comments  Comments: hx of CAD  Diagnosis: Giant cell arteritis ([place]) [446.5.ICD-9-CM]  Admitting Physician: Ivor Costa [[id number]]  Attending Physician: Ivor Costa [phone]  Estimated length of stay: past midnight tomorrow  Certification:: I certify this patient will need inpatient services for at least 2 midnights  PT Class (Do Not Modify): Inpatient [101]  PT Acc Code (Do Not Modify): Private [1]       Medical History Past Medical History:  Diagnosis Date  . Coronary artery disease   . GERD (gastroesophageal reflux disease)   . HLD (hyperlipidemia)   . Hypothyroidism     Allergies Allergies  Allergen Reactions  . Iodine   . Septra [Sulfamethoxazole-Trimethoprim]   . Sulfa Antibiotics   . Watermelon [Citrullus Vulgaris]     IV Location/Drains/Wounds Patient Lines/Drains/Airways Status   Active Line/Drains/Airways    None          Labs/Imaging Results for orders placed or performed during the hospital encounter of 07/31/17 (from the past 48 hour(s))  CBC with  Differential/Platelet     Status: Abnormal   Collection Time: 07/31/17  7:28 PM  Result Value Ref Range   WBC 11.2 (H) 4.0 - 10.5 K/uL   RBC 4.00 3.87 - 5.11 MIL/uL   Hemoglobin 10.9 (L) 12.0 - 15.0 g/dL   HCT 32.3 (L) 36.0 - 46.0 %   MCV 80.8 78.0 - 100.0 fL   MCH 27.3 26.0 - 34.0 pg   MCHC 33.7 30.0 - 36.0 g/dL   RDW 14.3 11.5 - 15.5 %   Platelets 398 150 - 400 K/uL   Neutrophils Relative % 93 %   Neutro Abs 10.4 (H) 1.7 - 7.7 K/uL   Lymphocytes Relative 4 %   Lymphs Abs 0.5 (L) 0.7 - 4.0 K/uL   Monocytes Relative 3 %   Monocytes Absolute 0.3 0.1 - 1.0 K/uL   Eosinophils Relative 0 %   Eosinophils Absolute 0.0 0.0 - 0.7 K/uL   Basophils Relative 0 %   Basophils Absolute 0.0 0.0 - 0.1 K/uL  Comprehensive metabolic panel     Status: Abnormal   Collection Time: 07/31/17  7:28 PM  Result Value Ref Range   Sodium 137 135 - 145 mmol/L   Potassium 3.4 (L) 3.5 - 5.1 mmol/L   Chloride 103 101 - 111 mmol/L   CO2 22 22 - 32 mmol/L   Glucose, Bld 133 (H) 65 -  99 mg/dL   BUN 23 (H) 6 - 20 mg/dL   Creatinine, Ser 0.98 0.44 - 1.00 mg/dL   Calcium 9.1 8.9 - 10.3 mg/dL   Total Protein 8.3 (H) 6.5 - 8.1 g/dL   Albumin 3.4 (L) 3.5 - 5.0 g/dL   AST 29 15 - 41 U/L   ALT 19 14 - 54 U/L   Alkaline Phosphatase 72 38 - 126 U/L   Total Bilirubin 0.4 0.3 - 1.2 mg/dL   GFR calc non Af Amer 54 (L) >60 mL/min   GFR calc Af Amer >60 >60 mL/min    Comment: (NOTE) The eGFR has been calculated using the CKD EPI equation. This calculation has not been validated in all clinical situations. eGFR's persistently <60 mL/min signify possible Chronic Kidney Disease.    Anion gap 12 5 - 15  Sedimentation rate     Status: Abnormal   Collection Time: 07/31/17  7:28 PM  Result Value Ref Range   Sed Rate 120 (H) 0 - 22 mm/hr  C-reactive protein     Status: Abnormal   Collection Time: 07/31/17  7:28 PM  Result Value Ref Range   CRP 2.9 (H) <1.0 mg/dL    Comment: Performed at [facility], [place] [address], [place], [place] [address]  I-stat troponin, ED     Status: None   Collection Time: 07/31/17  7:46 PM  Result Value Ref Range   Troponin i, poc 0.00 0.00 - 0.08 ng/mL   Comment 3            Comment: Due to the release kinetics of cTnI, a negative result within the first hours of the onset of symptoms does not rule out myocardial infarction with certainty. If myocardial infarction is still suspected, repeat the test at appropriate intervals.   Urinalysis, Routine w reflex microscopic     Status: Abnormal   Collection Time: 07/31/17  9:28 PM  Result Value Ref Range   Color, Urine STRAW (A) YELLOW   APPearance CLEAR CLEAR   Specific Gravity, Urine 1.008 1.005 - 1.030   pH 6.0 5.0 - 8.0   Glucose, UA NEGATIVE NEGATIVE mg/dL   Hgb urine dipstick MODERATE (A) NEGATIVE   Bilirubin Urine NEGATIVE NEGATIVE   Ketones, ur NEGATIVE NEGATIVE mg/dL   Protein, ur NEGATIVE NEGATIVE mg/dL   Nitrite NEGATIVE NEGATIVE   Leukocytes, UA NEGATIVE NEGATIVE   RBC / HPF 0-5 0 - 5 RBC/hpf   WBC, UA 0-5 0 - 5 WBC/hpf   Bacteria, UA RARE (A) NONE SEEN   Squamous Epithelial / LPF 0-5 (A) NONE SEEN   Mucus PRESENT    Ct Head Wo Contrast  Result Date: 07/31/2017 CLINICAL DATA:  Visual changes for the last 3 days, worsening. EXAM: CT HEAD WITHOUT CONTRAST TECHNIQUE: Contiguous axial images were obtained from the base of the skull through the vertex without intravenous contrast. COMPARISON:  None. FINDINGS: Brain: Ventricles are normal in size and configuration. There is no mass, hemorrhage, edema or other evidence of acute parenchymal abnormality. No extra-axial hemorrhage. Vascular: There are chronic calcified atherosclerotic changes of the large vessels at the skull base. No unexpected hyperdense vessel. Skull: Normal. Negative for fracture or focal lesion. Sinuses/Orbits: No acute finding. Other: None. IMPRESSION: Negative head CT. No intracranial mass, hemorrhage or edema. Periorbital and retro-orbital  soft tissues are unremarkable. Electronically Signed   By: Franki Cabot M.D.   On: 07/31/2017 21:40    Pending Labs [employer]  Start     Ordered   08/01/17 0500  TSH  Tomorrow morning,   R     07/31/17 2114   08/01/17 0990  Basic metabolic panel  Tomorrow morning,   R     07/31/17 2155   08/01/17 0500  Protime-INR  Tomorrow morning,   R     07/31/17 2155      Vitals/Pain Today's Vitals   07/31/17 1855 07/31/17 2042 07/31/17 2100 07/31/17 2203  BP: (!) 161/108 140/71 (!) 148/78 (!) 158/86  Pulse: 85 69 78 74  Resp: 18 14    Temp: 98 F (36.7 C)     TempSrc: Oral     SpO2: 100% 98% 96% 98%    Isolation Precautions No active isolations  Medications Medications  methylPREDNISolone sodium succinate (SOLU-MEDROL) 250 mg in sodium chloride 0.9 % 50 mL IVPB (0 mg Intravenous Stopped 07/31/17 2113)  potassium chloride 20 MEQ/15ML (10%) solution 20 mEq (not administered)  0.9 %  sodium chloride infusion (not administered)  atenolol (TENORMIN) tablet 25 mg (not administered)  rosuvastatin (CRESTOR) tablet 10 mg (not administered)  aspirin chewable tablet 81 mg (not administered)  isosorbide mononitrate (IMDUR) 24 hr tablet 60 mg (not administered)  pantoprazole (PROTONIX) EC tablet 20 mg (not administered)  levothyroxine (SYNTHROID, LEVOTHROID) tablet 88 mcg (not administered)  multivitamin with minerals tablet 1 tablet (not administered)  nitroGLYCERIN (NITROSTAT) SL tablet 0.4 mg (not administered)  losartan (COZAAR) tablet 50 mg (not administered)    And  hydrochlorothiazide (MICROZIDE) capsule 12.5 mg (not administered)  B-complex with vitamin C tablet 1 tablet (not administered)  hydrALAZINE (APRESOLINE) injection 5 mg (not administered)  ondansetron (ZOFRAN) injection 4 mg (not administered)  acetaminophen (TYLENOL) tablet 650 mg (not administered)  zolpidem (AMBIEN) tablet 5 mg (not administered)  enoxaparin (LOVENOX) injection 40 mg (not administered)   LORazepam (ATIVAN) injection 1 mg (1 mg Intravenous Given 07/31/17 2216)    Mobility Ambulatory

## 2017-07-31 NOTE — ED Notes (Signed)
Patient transported to MRI 

## 2017-08-01 LAB — GLUCOSE, CAPILLARY: GLUCOSE-CAPILLARY: 144 mg/dL — AB (ref 65–99)

## 2017-08-01 LAB — BASIC METABOLIC PANEL
Anion gap: 12 (ref 5–15)
BUN: 22 mg/dL — AB (ref 6–20)
CHLORIDE: 107 mmol/L (ref 101–111)
CO2: 21 mmol/L — ABNORMAL LOW (ref 22–32)
Calcium: 9.2 mg/dL (ref 8.9–10.3)
Creatinine, Ser: 0.9 mg/dL (ref 0.44–1.00)
GFR calc Af Amer: >60 mL/min (ref >60)
GFR calc non Af Amer: 60 mL/min — ABNORMAL LOW (ref >60)
Glucose, Bld: 142 mg/dL — ABNORMAL HIGH (ref 65–99)
POTASSIUM: 4 mmol/L (ref 3.5–5.1)
SODIUM: 140 mmol/L (ref 135–145)

## 2017-08-01 LAB — PROTIME-INR
INR: 1.24
PROTHROMBIN TIME: 15.5 s — AB (ref 11.4–15.2)

## 2017-08-01 LAB — TSH: TSH: 0.203 u[IU]/mL — ABNORMAL LOW (ref 0.350–4.500)

## 2017-08-01 MED ORDER — ORAL CARE MOUTH RINSE
15.0000 mL | Freq: Two times a day (BID) | OROMUCOSAL | Status: DC
  Administered 2017-08-01 – 2017-08-03 (×6): 15 mL via OROMUCOSAL

## 2017-08-01 MED ORDER — CHLORHEXIDINE GLUCONATE 0.12 % MT SOLN
15.0000 mL | Freq: Two times a day (BID) | OROMUCOSAL | Status: DC
  Administered 2017-08-01 – 2017-08-04 (×6): 15 mL via OROMUCOSAL
  Filled 2017-08-01 (×5): qty 15

## 2017-08-01 NOTE — Care Management Note (Signed)
Case Management Note  Patient Details  Name: MALIA CORSI MRN: [id number] Date of Birth: 03-29-1941  Subjective/Objective:76 y/o f admitted w/vision loss L eye. Fromhome. PT cons-await recc. Opthalmology following.                    Action/Plan:d/c plan home.   Expected Discharge Date:   (unknown)               Expected Discharge Plan:  Home/Self Care  In-House Referral:     Discharge planning Services  CM Consult  Post Acute Care Choice:    Choice offered to:     DME Arranged:    DME Agency:     HH Arranged:    HH Agency:     Status of Service:  In process, will continue to follow  If discussed at Long Length of Stay Meetings, dates discussed:    Additional Comments:  Lanier Clam, RN 08/01/2017, 11:23 AM

## 2017-08-01 NOTE — Evaluation (Signed)
Physical Therapy Evaluation Patient Details Name: Jody Shaw MRN: [id number] DOB: Feb 03, 1941 Today's Date: 08/01/2017   History of Present Illness  Jody Shaw is a 76 y.o. 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 Encouraged patient to call for assistance. Pt admitted with above diagnosis. Pt currently with functional limitations due to the deficits listed below (see PT Problem List).  Pt will benefit from skilled PT to increase their independence and safety with mobility to allow discharge to the venue listed below.       Follow Up Recommendations Outpatient PT (if balance  continues to be involved)    Equipment Recommendations  None recommended by PT    Recommendations for Other Services       Precautions / Restrictions Precautions Precautions: Fall Precaution Comments: left eye blindness      Mobility  Bed Mobility Overal bed mobility: Independent                Transfers Overall transfer level: Needs assistance   Transfers: Sit to/from Stand Sit to Stand: Supervision         General transfer comment: for balance   Ambulation/Gait Ambulation/Gait assistance: Min assist Ambulation Distance (Feet): 400 Feet Assistive device: 1 person hand held assist Gait Pattern/deviations: Step-through pattern;Staggering right;Staggering left     General Gait Details: staggers when turning head while walking and when turning around. Encourage patient to move slower  Information systems manager [place] (Stroke Patients Only)       Balance Overall balance assessment: Needs assistance Sitting-balance support: No upper extremity supported;Feet  supported Sitting balance-Leahy Scale: Normal     Standing balance support: During functional activity;No upper extremity supported Standing balance-Leahy Scale: Fair                               Pertinent Vitals/Pain Pain Assessment: No/denies pain    Home Living Family/patient expects to be discharged to:: Private residence Living Arrangements: Spouse/significant other Available Help at Discharge: Family Type of Home: House Home Access: Stairs to enter Entrance Stairs-Rails: None Entrance Stairs-Number of Steps: 2 Home Layout: Two level;Able to live on main level with bedroom/bathroom Home Equipment: None      Prior Function                 Hand Dominance        Extremity/Trunk Assessment   Upper Extremity Assessment Upper Extremity Assessment: Overall WFL for tasks assessed    Lower Extremity Assessment Lower Extremity Assessment: Overall WFL for tasks assessed    Cervical / Trunk Assessment Cervical / Trunk Assessment: Normal  Communication      Cognition Arousal/Alertness: Awake/alert Behavior During Therapy: WFL for tasks assessed/performed Overall Cognitive Status: Within Functional Limits for tasks assessed                                        General Comments      Exercises     Assessment/Plan    PT Assessment Patient needs continued PT services  PT Problem List Decreased balance;Decreased  mobility;Decreased safety awareness       PT Treatment Interventions Gait training;Functional mobility training;Balance training    PT Goals (Current goals can be found in the Care Plan section)  Acute Rehab PT Goals Patient Stated Goal: to get up and walk PT Goal Formulation: With patient Time For Goal Achievement: 08/08/17 Potential to Achieve Goals: Good    Frequency Min 3X/week   Barriers to discharge        Co-evaluation               AM-PAC PT "6 Clicks" Daily Activity  Outcome Measure  Difficulty turning over in bed (including adjusting bedclothes, sheets and blankets)?: None Difficulty moving from lying on back to sitting on the side of the bed? : None Difficulty sitting down on and standing up from a chair with arms (e.g., wheelchair, bedside commode, etc,.)?: A Little Help needed moving to and from a bed to chair (including a wheelchair)?: A Little Help needed walking in hospital room?: A Little Help needed climbing 3-5 steps with a railing? : Total 6 Click Score: 18    End of Session   Activity Tolerance: Patient tolerated treatment well Patient left: in chair;with call bell/phone within reach Nurse Communication: Mobility status PT Visit Diagnosis: Difficulty in walking, not elsewhere classified (R26.2);Other symptoms and signs involving the nervous system (R29.898)    Time: 1610-9604 PT Time Calculation (min) (ACUTE ONLY): 29 min   Charges:   PT Evaluation $PT Eval Low Complexity: 1 Low PT Treatments $Gait Training: 8-22 mins   PT G CodesBlanchard Shaw PT [phone]   Jody Shaw 08/01/2017, 5:19 PM

## 2017-08-01 NOTE — Progress Notes (Signed)
PROGRESS NOTE    Jody Shaw  WTU:[id number] DOB: January 08, 1941 DOA: 07/31/2017 PCP: Reynold Bowen, MD  Brief Narrative: Jody Shaw is Jody 76 y.o. female with medical history significant of hypertension, hyperlipidemia, GERD, hypothyroidism, CAD, who presented with vision loss in L Eye. Pt reported having blurry vision in left eye on Saturday and which progressively worsened. She lost most of her vision in left eye now, only light perception,  Patient went to see Dr. 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 -started on high dose solumedrol per Ophthalm Dr.Groats recommendations, will FU today per H&P, will d/w Temporal artery biopsy -will this be affected by current steroids -MRI negative for CVA -ESR very high at 120 -PT eval  HTN: -will continue her atenolol, Hyzaar  CAD in native artery:  -stable -Aspirin, Crestor, atenolol and Imdur  HLD: -crestor  GERD: -Protonix  Hypokalemia:  - Repleted  Hypothyroidism: Last TSH was not on record -Continue home Synthroid -Check TSH  DVT ppx: SQ Lovenox Code Status: Full code Family Communication: spouse at bed side Disposition Plan:  Anticipate discharge back to previous home environment  Consultants:   Ophthalm Dr.Groat   Subjective: No changes, loss of vision L eye, Jaw is better  Objective: Vitals:   07/31/17 2100 07/31/17 2203 08/01/17 0020 08/01/17 0544  BP: (!) 148/78 (!) 158/86 113/64 137/67  Pulse: 78 74 80 78  Resp:   16 16  Temp:   98 F (36.7 C) 97.6 F (36.4 C)  TempSrc:   Axillary Oral  SpO2: 96% 98%  94%  Weight:   84.3 kg (185 lb 13.6 oz)   Height:   _0  (1.6 m)     Intake/Output Summary (Last 24 hours) at 08/01/17 1302 Last data filed at  08/01/17 0825  Gross per 24 hour  Intake           564.75 ml  Output                0 ml  Net           564.75 ml   Filed Weights   08/01/17 0020  Weight: 84.3 kg (185 lb 13.6 oz)    Examination:  General exam: Appears calm and comfortable  HEENT: Very poor Vision L eye-only light perception and some shadows Respiratory system: Clear to auscultation. Respiratory effort normal. Cardiovascular system: S1 & S2 heard, RRR. No JVD, murmurs Gastrointestinal system: Abdomen is nondistended, soft and nontender.Normal bowel sounds heard. Central nervous system: Alert and oriented. No focal neurological deficits, loss of vision L eye Extremities: Symmetric 5 x 5 power. Skin: No rashes, lesions or ulcers Psychiatry: Judgement and insight appear normal. Mood & affect appropriate.     Data Reviewed:   CBC:  Recent Labs Lab 07/31/17 1928  WBC 11.2*  NEUTROABS 10.4*  HGB 10.9*  HCT 32.3*  MCV 80.8  PLT 179   Basic Metabolic Panel:  Recent Labs Lab 07/31/17 1928 08/01/17 0449  NA 137 140  K 3.4* 4.0  CL 103 107  CO2 22 21*  GLUCOSE 133* 142*  BUN 23* 22*  CREATININE 0.98 0.90  CALCIUM 9.1 9.2   GFR: Estimated Creatinine Clearance: 53 mL/min (by C-G formula based on SCr of 0.9 mg/dL). Liver Function  Tests:  Recent Labs Lab 07/31/17 1928  AST 29  ALT 19  ALKPHOS 72  BILITOT 0.4  PROT 8.3*  ALBUMIN 3.4*   No results for input(s): LIPASE, AMYLASE in the last 168 hours. No results for input(s): AMMONIA in the last 168 hours. Coagulation Profile:  Recent Labs Lab 08/01/17 0449  INR 1.24   Cardiac Enzymes: No results for input(s): CKTOTAL, CKMB, CKMBINDEX, TROPONINI in the last 168 hours. BNP (last 3 results) No results for input(s): PROBNP in the last 8760 hours. HbA1C: No results for input(s): HGBA1C in the last 72 hours. CBG:  Recent Labs Lab 08/01/17 0747  GLUCAP 144*   Lipid Profile: No results for input(s): CHOL, HDL, LDLCALC, TRIG, CHOLHDL,  LDLDIRECT in the last 72 hours. Thyroid Function Tests:  Recent Labs  08/01/17 0449  TSH 0.203*   Anemia Panel: No results for input(s): VITAMINB12, FOLATE, FERRITIN, TIBC, IRON, RETICCTPCT in the last 72 hours. Urine analysis:    Component Value Date/Time   COLORURINE STRAW (Jody) 07/31/2017 2128   APPEARANCEUR CLEAR 07/31/2017 2128   LABSPEC 1.008 07/31/2017 2128   PHURINE 6.0 07/31/2017 2128   GLUCOSEU NEGATIVE 07/31/2017 2128   HGBUR MODERATE (Jody) 07/31/2017 2128   BILIRUBINUR NEGATIVE 07/31/2017 2128   KETONESUR NEGATIVE 07/31/2017 2128   PROTEINUR NEGATIVE 07/31/2017 2128   NITRITE NEGATIVE 07/31/2017 2128   LEUKOCYTESUR NEGATIVE 07/31/2017 2128   Sepsis Labs: '@LABRCNTIP'$ (procalcitonin:4,lacticidven:4)  )No results found for this or any previous visit (from the past 240 hour(s)).       Radiology Studies: Ct Head Wo Contrast  Result Date: 07/31/2017 CLINICAL DATA:  Visual changes for the last 3 days, worsening. EXAM: CT HEAD WITHOUT CONTRAST TECHNIQUE: Contiguous axial images were obtained from the base of the skull through the vertex without intravenous contrast. COMPARISON:  None. FINDINGS: Brain: Ventricles are normal in size and configuration. There is no mass, hemorrhage, edema or other evidence of acute parenchymal abnormality. No extra-axial hemorrhage. Vascular: There are chronic calcified atherosclerotic changes of the large vessels at the skull base. No unexpected hyperdense vessel. Skull: Normal. Negative for fracture or focal lesion. Sinuses/Orbits: No acute finding. Other: None. IMPRESSION: Negative head CT. No intracranial mass, hemorrhage or edema. Periorbital and retro-orbital soft tissues are unremarkable. Electronically Signed   By: Franki Cabot M.D.   On: 07/31/2017 21:40   Mr Brain Wo Contrast  Result Date: 07/31/2017 CLINICAL DATA:  Loss of vision in the left eye. Possible temporal arteritis. EXAM: MRI HEAD WITHOUT CONTRAST TECHNIQUE: Multiplanar,  multiecho pulse sequences of the brain and surrounding structures were obtained without intravenous contrast. COMPARISON:  Head CT 07/31/2017 FINDINGS: Brain: The midline structures are normal. There is no focal diffusion restriction to indicate acute infarct. The brain parenchymal signal is normal and there is no mass lesion. No intraparenchymal hematoma or chronic microhemorrhage. Brain volume is normal for age without lobar predominant atrophy. The dura is normal and there is no extra-axial collection. Vascular: Major intracranial arterial and venous sinus flow voids are preserved. Skull and upper cervical spine: The visualized skull base, calvarium, upper cervical spine and extracranial soft tissues are normal. Sinuses/Orbits: No fluid levels or advanced mucosal thickening. No mastoid or middle ear effusion. Normal orbits. IMPRESSION: 1. No acute intracranial abnormality. 2. No focal lesion along the optic pathways or other finding to explain the reported vision loss. Electronically Signed   By: Ulyses Jarred M.D.   On: 07/31/2017 23:24        Scheduled Meds: . aspirin  81 mg Oral Once per day on Mon Wed Fri  . atenolol  25 mg Oral Daily  . B-complex with vitamin C  1 tablet Oral Daily  . chlorhexidine  15 mL Mouth Rinse BID  . enoxaparin (LOVENOX) injection  40 mg Subcutaneous Q24H  . losartan  50 mg Oral Daily   And  . hydrochlorothiazide  12.5 mg Oral Daily  . isosorbide mononitrate  60 mg Oral Daily  . levothyroxine  88 mcg Oral QAC breakfast  . mouth rinse  15 mL Mouth Rinse q12n4p  . multivitamin with minerals  1 tablet Oral Daily  . pantoprazole  20 mg Oral Daily  . [START ON 08/02/2017] rosuvastatin  10 mg Oral Once per day on Mon Thu   Continuous Infusions: . methylPREDNISolone (SOLU-MEDROL) injection Stopped (08/01/17 0854)     LOS: 1 day    Time spent: 35mn    PDomenic Polite MD [facility] Pager [phone] If 7PM-7AM, please contact  night-coverage [email or web address] Password [email or web address] 08/01/2017, 1:02 PM

## 2017-08-01 NOTE — Progress Notes (Signed)
BRIEF PROGRESS NOTE  Background: Ms. Vos is a 76 year old woman who was seen in my office on Monday (07/30/2017) with sudden onset painful vision loss OS and no complaints OD. She was subsequently noted to have normal VA OD and hand motion vision OS, a large rAPD (relative afferent pupillary defect) OS, and a pale swollen optic nerve OS. This is considered pathognomonic for giant cell arteritis until proven otherwise. She was sent immediately to Dr. Baldwin Crown (PCP) office and started on high-dose oral steroids (80 mg prednisone). Over the next day, there was concern for new symptoms such as swallowing difficulty and possible jaw claudication. She was therefore admitted to [facility] for high-dose IV steroids (methylprednisolone 250 mg QID) and monitoring. CT and MRI were notably unremarkable. Inflammatory markers (ESR and CRP) were significantly elevated, which is c/w presumed giant cell arteritis.  S: No new vision complaints. She can see some light OS, denies changes OD. She denies headache at the present.  O: VA 20/25 OD near and hand motion OS, brisk pupil OD, +rAPD OS, normal IOP OU to digital palpation, full EOM OU, full field OD to fingers.  A/P 1) Giant cell arteritis - Continue high-dose methylprednisolone for a total of 3 days. - Will then need to transition to PO prednisone 1 mg/kg/day for the next few weeks. - Anticipate no VA improvement OS. The goal is to prevent loss of vision OD. - Patient will need rheumatology consult (here or outpatient) for ongoing steroid management. - She will also need a temporal artery biopsy within the next 6-9 days, the sooner the better. This can be set up outpatient or, if possible, while she is in the hospital over the next few days.  I will plan on seeing her in the office in about a week subsequent to hospital discharge at:  [place]. [address] [place], [place] [address] [phone]  R Wyatt Portela, MD

## 2017-08-02 LAB — GLUCOSE, CAPILLARY: Glucose-Capillary: 141 mg/dL — ABNORMAL HIGH (ref 65–99)

## 2017-08-02 MED ORDER — DEXTROSE 5 % IV SOLN: 2.0000 g | INTRAVENOUS | Status: DC

## 2017-08-02 MED ORDER — CEFAZOLIN SODIUM-DEXTROSE 2-4 GM/100ML-% IV SOLN
2.0000 g | INTRAVENOUS | Status: AC
  Administered 2017-08-03: 2 g via INTRAVENOUS

## 2017-08-02 MED ORDER — LEVOTHYROXINE SODIUM 75 MCG PO TABS
75.0000 ug | ORAL_TABLET | Freq: Every day | ORAL | Status: DC
  Administered 2017-08-02 – 2017-08-04 (×3): 75 ug via ORAL
  Filled 2017-08-02 (×3): qty 1

## 2017-08-02 MED ORDER — CEFAZOLIN SODIUM-DEXTROSE 2-4 GM/100ML-% IV SOLN: 2.0000 g | INTRAVENOUS | Status: DC

## 2017-08-02 NOTE — Progress Notes (Signed)
PROGRESS NOTE    Jody Shaw  EFE:[id number] DOB: [birth year] DOA: 07/31/2017 PCP: Jody Bowen, MD  Brief Narrative: [name] is a 76 y.o. female with medical history significant of hypertension, hyperlipidemia, GERD, hypothyroidism, CAD, who presented with vision loss in L Eye. Pt reported having blurry vision in left eye on Saturday and which progressively worsened. She lost most of her vision in left eye now, only light perception,  Patient went to see Dr. Wyatt Portela from ophthomology 9/17 and had a dilated eye exam and was concerned for possible giant cell arteritis, started steroids and recommended admission   Assessment & Plan:    Vision loss of left eye  -Giant Cell/Temporal arteritis most likely -started on high dose solumedrol per Ophthalm Dr.Groats recommendations, starting 9/18 pm-day 2 now -do not expect much improvement in left eye vision, this to prevent vision loss in the right eye -MRI negative for stroke, ESR very impressive at 120 -Dr. Katy Fitch recommends temporal artery biopsy in 6-10 days of starting steroids, I have requested surgical consult for this -Will need rheumatology follow-up post discharge, will send an urgent referral -DC home on prednisone taper after completing 3 days of IV Solu-Medrol  HTN: -will continue her atenolol, Hyzaar  CAD in native artery:  -stable -Aspirin, Crestor, atenolol and Imdur  HLD: -crestor  GERD: -Protonix  Hypokalemia:  -Repleted  Hypothyroidism: Last TSH was not on record -TSH low, so cut down synthroid dose  DVT ppx: SQ Lovenox Code Status: Full code Family Communication: spouse at bed side Disposition Plan: Home in 1-2 days  Consultants:   Ophthalm Dr.Groat   Subjective: Feels okay continues to have only light perception in the left eye  Objective: Vitals:   08/01/17 1402 08/01/17 2216 08/02/17 0544 08/02/17 1324  BP: (!) 125/57 (!) 122/58 139/66 136/77  Pulse: 75 78 81 86  Resp: _0 Temp: 97.7 F (36.5 C) 98.3 F (36.8 C) 97.8 F (36.6 C) (!) 97.5 F (36.4 C)  TempSrc: Oral Oral Oral Oral  SpO2: 97% 93% 94% 98%  Weight:      Height:        Intake/Output Summary (Last 24 hours) at 08/02/17 1345 Last data filed at 08/02/17 1327  Gross per 24 hour  Intake             1012 ml  Output                0 ml  Net             1012 ml   Filed Weights   08/01/17 0020  Weight: 84.3 kg (185 lb 13.6 oz)    Examination:  General exam: Appears calm and comfortable, no distress HEENT: Very poor Vision L eye-only light perception and some shadows Lungs: Good air movement bilaterally, CTAB CVS: RRR,No Gallops,Rubs or new Murmurs Abd: soft, Non tender, non distended, BS present Extremities: No Cyanosis, Clubbing or edema Skin: no new rashes    Data Reviewed:   CBC:  Recent Labs Lab 07/31/17 1928  WBC 11.2*  NEUTROABS 10.4*  HGB 10.9*  HCT 32.3*  MCV 80.8  PLT 832   Basic Metabolic Panel:  Recent Labs Lab 07/31/17 1928 08/01/17 0449  NA 137 140  K 3.4* 4.0  CL 103 107  CO2 22 21*  GLUCOSE 133* 142*  BUN 23* 22*  CREATININE 0.98 0.90  CALCIUM 9.1 9.2   GFR: Estimated Creatinine Clearance: 53 mL/min (by C-G formula based  on SCr of 0.9 mg/dL). Liver Function Tests:  Recent Labs Lab 07/31/17 1928  AST 29  ALT 19  ALKPHOS 72  BILITOT 0.4  PROT 8.3*  ALBUMIN 3.4*   No results for input(s): LIPASE, AMYLASE in the last 168 hours. No results for input(s): AMMONIA in the last 168 hours. Coagulation Profile:  Recent Labs Lab 08/01/17 0449  INR 1.24   Cardiac Enzymes: No results for input(s): CKTOTAL, CKMB, CKMBINDEX, TROPONINI in the last 168 hours. BNP (last 3 results) No results for input(s): PROBNP in the last 8760 hours. HbA1C: No results for input(s): HGBA1C in the last 72 hours. CBG:  Recent Labs Lab 08/01/17 0747 08/02/17 0744  GLUCAP 144* 141*   Lipid Profile: No results for input(s): CHOL, HDL, LDLCALC, TRIG,  CHOLHDL, LDLDIRECT in the last 72 hours. Thyroid Function Tests:  Recent Labs  08/01/17 0449  TSH 0.203*   Anemia Panel: No results for input(s): VITAMINB12, FOLATE, FERRITIN, TIBC, IRON, RETICCTPCT in the last 72 hours. Urine analysis:    Component Value Date/Time   COLORURINE STRAW (A) 07/31/2017 2128   APPEARANCEUR CLEAR 07/31/2017 2128   LABSPEC 1.008 07/31/2017 2128   PHURINE 6.0 07/31/2017 2128   GLUCOSEU NEGATIVE 07/31/2017 2128   HGBUR MODERATE (A) 07/31/2017 2128   BILIRUBINUR NEGATIVE 07/31/2017 2128   KETONESUR NEGATIVE 07/31/2017 2128   PROTEINUR NEGATIVE 07/31/2017 2128   NITRITE NEGATIVE 07/31/2017 2128   LEUKOCYTESUR NEGATIVE 07/31/2017 2128   Sepsis Labs: _0 (procalcitonin:4,lacticidven:4)  )No results found for this or any previous visit (from the past 240 hour(s)).       Radiology Studies: Ct Head Wo Contrast  Result Date: 07/31/2017 CLINICAL DATA:  Visual changes for the last 3 days, worsening. EXAM: CT HEAD WITHOUT CONTRAST TECHNIQUE: Contiguous axial images were obtained from the base of the skull through the vertex without intravenous contrast. COMPARISON:  None. FINDINGS: Brain: Ventricles are normal in size and configuration. There is no mass, hemorrhage, edema or other evidence of acute parenchymal abnormality. No extra-axial hemorrhage. Vascular: There are chronic calcified atherosclerotic changes of the large vessels at the skull base. No unexpected hyperdense vessel. Skull: Normal. Negative for fracture or focal lesion. Sinuses/Orbits: No acute finding. Other: None. IMPRESSION: Negative head CT. No intracranial mass, hemorrhage or edema. Periorbital and retro-orbital soft tissues are unremarkable. Electronically Signed   By: Franki Cabot M.D.   On: 07/31/2017 21:40   Mr Brain Wo Contrast  Result Date: 07/31/2017 CLINICAL DATA:  Loss of vision in the left eye. Possible temporal arteritis. EXAM: MRI HEAD WITHOUT CONTRAST TECHNIQUE:  Multiplanar, multiecho pulse sequences of the brain and surrounding structures were obtained without intravenous contrast. COMPARISON:  Head CT 07/31/2017 FINDINGS: Brain: The midline structures are normal. There is no focal diffusion restriction to indicate acute infarct. The brain parenchymal signal is normal and there is no mass lesion. No intraparenchymal hematoma or chronic microhemorrhage. Brain volume is normal for age without lobar predominant atrophy. The dura is normal and there is no extra-axial collection. Vascular: Major intracranial arterial and venous sinus flow voids are preserved. Skull and upper cervical spine: The visualized skull base, calvarium, upper cervical spine and extracranial soft tissues are normal. Sinuses/Orbits: No fluid levels or advanced mucosal thickening. No mastoid or middle ear effusion. Normal orbits. IMPRESSION: 1. No acute intracranial abnormality. 2. No focal lesion along the optic pathways or other finding to explain the reported vision loss. Electronically Signed   By: Ulyses Jarred M.D.   On: 07/31/2017 23:24  Scheduled Meds: . aspirin  81 mg Oral Once per day on Mon Wed Fri  . atenolol  25 mg Oral Daily  . B-complex with vitamin C  1 tablet Oral Daily  . chlorhexidine  15 mL Mouth Rinse BID  . enoxaparin (LOVENOX) injection  40 mg Subcutaneous Q24H  . losartan  50 mg Oral Daily   And  . hydrochlorothiazide  12.5 mg Oral Daily  . isosorbide mononitrate  60 mg Oral Daily  . levothyroxine  75 mcg Oral QAC breakfast  . mouth rinse  15 mL Mouth Rinse q12n4p  . multivitamin with minerals  1 tablet Oral Daily  . pantoprazole  20 mg Oral Daily  . rosuvastatin  10 mg Oral Once per day on Mon Thu   Continuous Infusions: . [START ON 08/03/2017]  ceFAZolin (ANCEF) IV    . methylPREDNISolone (SOLU-MEDROL) injection 250 mg (08/02/17 1327)     LOS: 2 days    Time spent: 37mn    PDomenic Polite MD [facility] Pager [phone] If  7PM-7AM, please contact night-coverage [email or web address] Password [email or web address] 08/02/2017, 1:45 PM

## 2017-08-02 NOTE — Progress Notes (Signed)
CSW informed by [facility] that patient needed SCAT resource. CSW spoke with patient at bedside who reported that she was interested in SCAT resources, noting her husband may not be able to take her to her appointment. CSW provided patient with SCAT information and appplication. CSW inquired if patient needed any additional resources, patient reported none. CSW signing off, no other needs identified at this time.  Celso Sickle, [place] Clinical Social Worker [facility] Cell#: [phone]

## 2017-08-02 NOTE — Care Management Note (Signed)
Case Management Note  Patient Details  Name: Jody Shaw MRN: [id number] Date of Birth: [birth year]  Subjective/Objective:  76 y/o f admitted w/vision loss l eye. From home w/spouse. PT-otpt PT. Provided patient w/otpt PT resources. MD-please provide manual script for otpt PT, w/dx.Patient,& spouse unable to get back & forth in community-CSW notified for S.C.A.T info.                  Action/Plan:d/c plan home.   Expected Discharge Date:   (unknown)               Expected Discharge Plan:  OP Rehab  In-House Referral:  Clinical Social Work  Discharge planning Services  CM Consult  Post Acute Care Choice:    Choice offered to:     DME Arranged:    DME Agency:     HH Arranged:    HH Agency:     Status of Service:  In process, will continue to follow  If discussed at Long Length of Stay Meetings, dates discussed:    Additional Comments:  Lanier Clam, RN 08/02/2017, 3:33 PM
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
## 2017-08-03 ENCOUNTER — Inpatient Hospital Stay (HOSPITAL_COMMUNITY): Payer: Medicare Other | Admitting: Certified Registered"

## 2017-08-03 ENCOUNTER — Encounter (HOSPITAL_COMMUNITY)
Admission: EM | Disposition: A | Discharge status: Home / Self Care | Payer: Self-pay | Source: Home / Self Care | Attending: Internal Medicine

## 2017-08-03 ENCOUNTER — Encounter (HOSPITAL_COMMUNITY): Payer: Self-pay

## 2017-08-03 ENCOUNTER — Inpatient Hospital Stay: Admit: 2017-08-03 | Payer: Medicare Other | Admitting: General Surgery

## 2017-08-03 HISTORY — PX: ARTERY BIOPSY: SHX891

## 2017-08-03 LAB — SURGICAL PCR SCREEN
MRSA, PCR: NEGATIVE
Staphylococcus aureus: POSITIVE — AB

## 2017-08-03 LAB — GLUCOSE, CAPILLARY: Glucose-Capillary: 139 mg/dL — ABNORMAL HIGH (ref 65–99)

## 2017-08-03 SURGERY — BIOPSY TEMPORAL ARTERY
Anesthesia: General | Laterality: Left

## 2017-08-03 MED ORDER — PREDNISONE 20 MG PO TABS
80.0000 mg | ORAL_TABLET | Freq: Every day | ORAL | Status: DC
  Administered 2017-08-04: 80 mg via ORAL
  Filled 2017-08-03: qty 4

## 2017-08-03 MED ORDER — EPHEDRINE 5 MG/ML INJ
INTRAVENOUS | Status: AC
  Filled 2017-08-03: qty 10

## 2017-08-03 MED ORDER — PROPOFOL 10 MG/ML IV BOLUS
INTRAVENOUS | Status: DC | PRN
  Administered 2017-08-03: 100 mg via INTRAVENOUS

## 2017-08-03 MED ORDER — LIDOCAINE 2% (20 MG/ML) 5 ML SYRINGE
INTRAMUSCULAR | Status: AC
  Filled 2017-08-03: qty 10

## 2017-08-03 MED ORDER — BUPIVACAINE-EPINEPHRINE 0.25% -1:200000 IJ SOLN
INTRAMUSCULAR | Status: DC | PRN
  Administered 2017-08-03: 10 mL

## 2017-08-03 MED ORDER — ONDANSETRON HCL 4 MG/2ML IJ SOLN: 4.0000 mg | Freq: Once | INTRAMUSCULAR | Status: DC | PRN

## 2017-08-03 MED ORDER — 0.9 % SODIUM CHLORIDE (POUR BTL) OPTIME
TOPICAL | Status: DC | PRN
  Administered 2017-08-03: 1000 mL

## 2017-08-03 MED ORDER — LACTATED RINGERS IV SOLN
INTRAVENOUS | Status: DC
  Administered 2017-08-03: 10:00:00 via INTRAVENOUS
  Administered 2017-08-03: 1000 mL via INTRAVENOUS

## 2017-08-03 MED ORDER — FENTANYL CITRATE (PF) 250 MCG/5ML IJ SOLN
INTRAMUSCULAR | Status: AC
  Filled 2017-08-03: qty 5

## 2017-08-03 MED ORDER — EPHEDRINE SULFATE-NACL 50-0.9 MG/10ML-% IV SOSY
PREFILLED_SYRINGE | INTRAVENOUS | Status: DC | PRN
  Administered 2017-08-03: 10 mg via INTRAVENOUS
  Administered 2017-08-03: 15 mg via INTRAVENOUS

## 2017-08-03 MED ORDER — FENTANYL CITRATE (PF) 100 MCG/2ML IJ SOLN: 25.0000 ug | INTRAMUSCULAR | Status: DC | PRN

## 2017-08-03 MED ORDER — MIDAZOLAM HCL 2 MG/2ML IJ SOLN
INTRAMUSCULAR | Status: DC | PRN
  Administered 2017-08-03: 1 mg via INTRAVENOUS

## 2017-08-03 MED ORDER — PANTOPRAZOLE SODIUM 40 MG PO TBEC
40.0000 mg | DELAYED_RELEASE_TABLET | Freq: Every day | ORAL | Status: DC
Start: 2017-08-04 — End: 2017-08-04
  Administered 2017-08-04: 40 mg via ORAL
  Filled 2017-08-03: qty 1

## 2017-08-03 MED ORDER — LIDOCAINE 2% (20 MG/ML) 5 ML SYRINGE
INTRAMUSCULAR | Status: DC | PRN
  Administered 2017-08-03: 80 mg via INTRAVENOUS

## 2017-08-03 MED ORDER — ONDANSETRON HCL 4 MG/2ML IJ SOLN
INTRAMUSCULAR | Status: DC | PRN
  Administered 2017-08-03: 4 mg via INTRAVENOUS

## 2017-08-03 MED ORDER — BUPIVACAINE-EPINEPHRINE (PF) 0.25% -1:200000 IJ SOLN
INTRAMUSCULAR | Status: AC
  Filled 2017-08-03: qty 30

## 2017-08-03 MED ORDER — PROPOFOL 10 MG/ML IV BOLUS
INTRAVENOUS | Status: AC
  Filled 2017-08-03: qty 20

## 2017-08-03 MED ORDER — ONDANSETRON HCL 4 MG/2ML IJ SOLN
INTRAMUSCULAR | Status: AC
Start: 2017-08-03 — End: 2017-08-03
  Filled 2017-08-03: qty 2

## 2017-08-03 MED ORDER — CEFAZOLIN SODIUM-DEXTROSE 2-4 GM/100ML-% IV SOLN
INTRAVENOUS | Status: AC
  Filled 2017-08-03: qty 100

## 2017-08-03 MED ORDER — FENTANYL CITRATE (PF) 250 MCG/5ML IJ SOLN
INTRAMUSCULAR | Status: DC | PRN
  Administered 2017-08-03: 50 ug via INTRAVENOUS

## 2017-08-03 MED ORDER — MIDAZOLAM HCL 2 MG/2ML IJ SOLN
INTRAMUSCULAR | Status: AC
  Filled 2017-08-03: qty 2

## 2017-08-03 SURGICAL SUPPLY — 41 items
ADH SKN CLS APL DERMABOND .7 (GAUZE/BANDAGES/DRESSINGS) ×1
BLADE SURG 15 STRL LF DISP TIS (BLADE) ×1 IMPLANT
BLADE SURG 15 STRL SS (BLADE) ×3
CHLORAPREP W/TINT 26ML (MISCELLANEOUS) IMPLANT
COVER SURGICAL LIGHT HANDLE (MISCELLANEOUS) ×3 IMPLANT
DERMABOND ADVANCED (GAUZE/BANDAGES/DRESSINGS) ×2
DERMABOND ADVANCED .7 DNX12 (GAUZE/BANDAGES/DRESSINGS) ×1 IMPLANT
DISSECTOR ROUND CHERRY 3/8 STR (MISCELLANEOUS) ×3 IMPLANT
DRAPE LAPAROTOMY T 98X78 PEDS (DRAPES) ×3 IMPLANT
DRSG TEGADERM 2-3/8X2-3/4 SM (GAUZE/BANDAGES/DRESSINGS) IMPLANT
ELECT COATED BLADE 2.86 ST (ELECTRODE) ×3 IMPLANT
ELECT NEEDLE TIP 2.8 STRL (NEEDLE) IMPLANT
ELECT PENCIL ROCKER SW 15FT (MISCELLANEOUS) ×3 IMPLANT
ELECT REM PT RETURN 15FT ADLT (MISCELLANEOUS) ×3 IMPLANT
GAUZE SPONGE 2X2 8PLY STRL LF (GAUZE/BANDAGES/DRESSINGS) IMPLANT
GAUZE SPONGE 4X4 12PLY STRL (GAUZE/BANDAGES/DRESSINGS) IMPLANT
GAUZE SPONGE 4X4 16PLY XRAY LF (GAUZE/BANDAGES/DRESSINGS) IMPLANT
GLOVE BIOGEL PI IND STRL 7.0 (GLOVE) ×1 IMPLANT
GLOVE BIOGEL PI INDICATOR 7.0 (GLOVE) ×2
GLOVE SURG SS PI 7.0 STRL IVOR (GLOVE) ×3 IMPLANT
GOWN STRL REUS W/TWL LRG LVL3 (GOWN DISPOSABLE) ×6 IMPLANT
GOWN STRL REUS W/TWL XL LVL3 (GOWN DISPOSABLE) IMPLANT
KIT BASIN OR (CUSTOM PROCEDURE TRAY) ×3 IMPLANT
NEEDLE HYPO 22GX1.5 SAFETY (NEEDLE) ×3 IMPLANT
PACK BASIC VI WITH GOWN DISP (CUSTOM PROCEDURE TRAY) ×3 IMPLANT
SPONGE GAUZE 2X2 STER 10/PKG (GAUZE/BANDAGES/DRESSINGS)
SUCTION FRAZIER HANDLE 12FR (TUBING) ×2
SUCTION TUBE FRAZIER 12FR DISP (TUBING) ×1 IMPLANT
SUT MNCRL AB 4-0 PS2 18 (SUTURE) ×3 IMPLANT
SUT SILK 2 0 (SUTURE) ×2
SUT SILK 2-0 18XBRD TIE 12 (SUTURE) ×1 IMPLANT
SUT SILK 3 0 (SUTURE)
SUT SILK 3-0 18XBRD TIE 12 (SUTURE) IMPLANT
SUT SILK 4-0 12X30IN (SUTURE) ×3 IMPLANT
SUT VIC AB 3-0 SH 27 (SUTURE) ×3
SUT VIC AB 3-0 SH 27X BRD (SUTURE) IMPLANT
SUT VIC AB 3-0 SH 27XBRD (SUTURE) ×1 IMPLANT
SYR CONTROL 10ML LL (SYRINGE) IMPLANT
TOWEL OR 17X26 10 PK STRL BLUE (TOWEL DISPOSABLE) ×3 IMPLANT
TOWEL OR NON WOVEN STRL DISP B (DISPOSABLE) ×3 IMPLANT
YANKAUER SUCT BULB TIP 10FT TU (MISCELLANEOUS) IMPLANT

## 2017-08-03 NOTE — Anesthesia Procedure Notes (Signed)
Procedure Name: LMA Insertion Date/Time: 08/03/2017 9:44 AM Performed by: Minerva Ends Pre-anesthesia Checklist: Patient identified, Emergency Drugs available, Suction available and Patient being monitored Patient Re-evaluated:Patient Re-evaluated prior to induction Oxygen Delivery Method: Circle System Utilized Preoxygenation: Pre-oxygenation with 100% oxygen Induction Type: IV induction Ventilation: Mask ventilation without difficulty LMA: LMA inserted LMA Size: 4.0 Tube type: Oral Number of attempts: 1 Placement Confirmation: positive ETCO2 Tube secured with: Tape Dental Injury: Teeth and Oropharynx as per pre-operative assessment  Comments: Smooth IV induction Turk--- LMA AM CRNA-- atraumatic--- teeth and mouth as preop--- chipped and missing teeth on preop assessment --- bilat BSTurk

## 2017-08-03 NOTE — Progress Notes (Signed)
   08/03/17 1500  Clinical Encounter Type  Visited With Patient  Visit Type Initial  Referral From Nurse  Consult/Referral To Chaplain  Spiritual Encounters  Spiritual Needs Prayer  Stress Factors  Patient Stress Factors None identified   Following up on a consult for prayer.  Patient was awake and very alert.  The patient very much appreciated the company and a time to reflect on the family and so much love.  Patient has lots of support and has a wonderful outlook despite the loss of vision in one eye.  Patient shared stories and we shared prayer together.  Will follow up as needed.   Chaplain Agustin Cree

## 2017-08-03 NOTE — Transfer of Care (Signed)
Immediate Anesthesia Transfer of Care Note  Patient: Jody Shaw  Procedure(s) Performed: Procedure(s): BIOPSY TEMPORAL ARTERY (Left)  Patient Location: PACU  Anesthesia Type:General  Level of Consciousness: sedated  Airway & Oxygen Therapy: Patient Spontanous Breathing and Patient connected to face mask oxygen  Post-op Assessment: Report given to RN and Post -op Vital signs reviewed and stable  Post vital signs: Reviewed and stable  Last Vitals:  Vitals:   08/02/17 2032 08/03/17 0538  BP: (!) 143/73 (!) 147/71  Pulse: 84 73  Resp: 18 18  Temp: 36.6 C 36.8 C  SpO2: 97% 96%    Last Pain:  Vitals:   08/03/17 0916  TempSrc:   PainSc: 0-No pain      Patients Stated Pain Goal: 4 (08/03/17 0916)  Complications: No apparent anesthesia complications

## 2017-08-03 NOTE — Progress Notes (Signed)
Pre Procedure note for inpatients:   Jody Shaw has been scheduled for Procedure(s): BIOPSY TEMPORAL ARTERY (Left) today. The various methods of treatment have been discussed with the patient. After consideration of the risks, benefits and treatment options the patient has consented to the planned procedure.   The patient has been seen and labs reviewed. There are no changes in the patient's condition to prevent proceeding with the planned procedure today.  Recent labs:  Lab Results  Component Value Date   WBC 11.2 (H) 07/31/2017   HGB 10.9 (L) 07/31/2017   HCT 32.3 (L) 07/31/2017   PLT 398 07/31/2017   GLUCOSE 142 (H) 08/01/2017   ALT 19 07/31/2017   AST 29 07/31/2017   NA 140 08/01/2017   K 4.0 08/01/2017   CL 107 08/01/2017   CREATININE 0.90 08/01/2017   BUN 22 (H) 08/01/2017   CO2 21 (L) 08/01/2017   TSH 0.203 (L) 08/01/2017   INR 1.24 08/01/2017    Rodman Pickle, MD 08/03/2017 8:58 AM

## 2017-08-03 NOTE — Care Management Important Message (Signed)
Important Message  Patient Details  Name: Jody Shaw MRN: [id number] Date of Birth: 05-24-1941   Medicare Important Message Given:  Yes    [name] 08/03/2017, 2:02 PMImportant Message  Patient Details  Name: Jody Shaw MRN: [id number] Date of Birth: 1940/12/15   Medicare Important Message Given:  Yes    [name] 08/03/2017, 2:02 PM

## 2017-08-03 NOTE — Anesthesia Preprocedure Evaluation (Addendum)
Anesthesia Evaluation  Patient identified by MRN, date of birth, ID band Patient awake    Reviewed: Allergy & Precautions, NPO status , Patient's Chart, lab work & pertinent test results, reviewed documented beta blocker date and time   Airway Mallampati: II  TM Distance: >3 FB Neck ROM: Full    Dental  (+) Dental Advisory Given, Partial Upper   Pulmonary neg pulmonary ROS, former smoker,    Pulmonary exam normal breath sounds clear to auscultation       Cardiovascular hypertension, Pt. on home beta blockers and Pt. on medications + CAD  Normal cardiovascular exam Rhythm:Regular Rate:Normal  possible giant cell arteritis   Neuro/Psych negative neurological ROS     GI/Hepatic Neg liver ROS, GERD  Medicated,  Endo/Other  Hypothyroidism Obesity   Renal/GU negative Renal ROS     Musculoskeletal negative musculoskeletal ROS (+)   Abdominal   Peds  Hematology  (+) Blood dyscrasia, anemia ,   Anesthesia Other Findings Day of surgery medications reviewed with the patient.  Reproductive/Obstetrics                           Anesthesia Physical Anesthesia Plan  ASA: III  Anesthesia Plan: General   Post-op Pain Management:    Induction: Intravenous  PONV Risk Score and Plan: 3 and Ondansetron, Dexamethasone and Treatment may vary due to age or medical condition  Airway Management Planned: LMA  Additional Equipment:   Intra-op Plan:   Post-operative Plan: Extubation in OR  Informed Consent: I have reviewed the patients History and Physical, chart, labs and discussed the procedure including the risks, benefits and alternatives for the proposed anesthesia with the patient or authorized representative who has indicated his/her understanding and acceptance.   Dental advisory given  Plan Discussed with: CRNA  Anesthesia Plan Comments: (Risks/benefits of general anesthesia discussed with  patient including risk of damage to teeth, lips, gum, and tongue, nausea/vomiting, allergic reactions to medications, and the possibility of heart attack, stroke and death.  All patient questions answered.  Patient wishes to proceed.)        Anesthesia Quick Evaluation

## 2017-08-03 NOTE — Progress Notes (Signed)
Back from OR Lt temporal area w/ bruising no drng A&Ox4 voices no c/o skin WDI x surg site to left head

## 2017-08-03 NOTE — Progress Notes (Signed)
PROGRESS NOTE    Jody Shaw  XVQ:[id number] DOB: [birth year] DOA: 07/31/2017 PCP: Reynold Bowen, MD  Brief Narrative: A Jody Shaw is a 76 y.o. female with medical history significant of hypertension, hyperlipidemia, GERD, hypothyroidism, CAD, who presented with vision loss in L Eye. Pt reported having blurry vision in left eye on Saturday and which progressively worsened. She lost most of her vision in left eye now, only light perception,  Patient went to see Dr. 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 Katy Fitch recommended temporal artery biopsy in 6-10 days of starting steroids, Appreciate surgical consult-plan for biopsy today -Will need rheumatology follow-up post discharge, will send an urgent referral -DC home on prednisone taper after completing 3 days of IV Solu-Medrol  HTN: -will continue her atenolol, Hyzaar  CAD in native artery:  -stable -Aspirin, Crestor, atenolol and Imdur  HLD: -crestor  GERD: -Protonix  Hypokalemia:  -Repleted  Hypothyroidism: Last TSH was not on record -TSH low, so cut down synthroid dose  DVT ppx: SQ Lovenox Code Status: Full code Family Communication: none at bed side Disposition Plan: Home tomorrow  Consultants:   Ophthalm Dr.Groat   Subjective: No changes, R eye ok, some blurriness No changes on left  Objective: Vitals:   08/03/17 1045 08/03/17 1100 08/03/17 1115 08/03/17 1130  BP: (!) 151/64 (!) 150/76 (!)  141/66 140/66  Pulse: 69 68 62 61  Resp: _0 Temp:   (!) 97.5 F (36.4 C) (!) 97.4 F (36.3 C)  TempSrc:      SpO2: 100% 95% 95% 97%  Weight:      Height:        Intake/Output Summary (Last 24 hours) at 08/03/17 1421 Last data filed at 08/03/17 1115  Gross per 24 hour  Intake              882 ml  Output                6 ml  Net              876 ml   Filed Weights   08/01/17 0020 08/03/17 0916  Weight: 84.3 kg (185 lb 13.6 oz) 83.9 kg (185 lb)    Examination:  Gen: Awake, Alert, Oriented X 3, cal, no distress HEENT: Very poor Vision L eye-only light perception and some shadows Lungs: Good air movement bilaterally, CTAB CVS: RRR,No Gallops,Rubs or new Murmurs Abd: soft, Non tender, non distended, BS present Extremities: No Cyanosis, Clubbing or edema Skin: no new rashes    Data Reviewed:   CBC:  Recent Labs Lab 07/31/17 1928  WBC 11.2*  NEUTROABS 10.4*  HGB 10.9*  HCT 32.3*  MCV 80.8  PLT 093   Basic Metabolic Panel:  Recent Labs Lab 07/31/17 1928 08/01/17 0449  NA 137 140  K 3.4* 4.0  CL 103 107  CO2 22 21*  GLUCOSE 133* 142*  BUN 23* 22*  CREATININE 0.98 0.90  CALCIUM 9.1 9.2  GFR: Estimated Creatinine Clearance: 52.9 mL/min (by C-G formula based on SCr of 0.9 mg/dL). Liver Function Tests:  Recent Labs Lab 07/31/17 1928  AST 29  ALT 19  ALKPHOS 72  BILITOT 0.4  PROT 8.3*  ALBUMIN 3.4*   No results for input(s): LIPASE, AMYLASE in the last 168 hours. No results for input(s): AMMONIA in the last 168 hours. Coagulation Profile:  Recent Labs Lab 08/01/17 0449  INR 1.24   Cardiac Enzymes: No results for input(s): CKTOTAL, CKMB, CKMBINDEX, TROPONINI in the last 168 hours. BNP (last 3 results) No results for input(s): PROBNP in the last 8760 hours. HbA1C: No results for input(s): HGBA1C in the last 72 hours. CBG:  Recent Labs Lab 08/01/17 0747 08/02/17 0744 08/03/17 0810  GLUCAP 144* 141* 139*   Lipid  Profile: No results for input(s): CHOL, HDL, LDLCALC, TRIG, CHOLHDL, LDLDIRECT in the last 72 hours. Thyroid Function Tests:  Recent Labs  08/01/17 0449  TSH 0.203*   Anemia Panel: No results for input(s): VITAMINB12, FOLATE, FERRITIN, TIBC, IRON, RETICCTPCT in the last 72 hours. Urine analysis:    Component Value Date/Time   COLORURINE STRAW (A) 07/31/2017 2128   APPEARANCEUR CLEAR 07/31/2017 2128   LABSPEC 1.008 07/31/2017 2128   PHURINE 6.0 07/31/2017 2128   GLUCOSEU NEGATIVE 07/31/2017 2128   HGBUR MODERATE (A) 07/31/2017 2128   BILIRUBINUR NEGATIVE 07/31/2017 2128   KETONESUR NEGATIVE 07/31/2017 2128   PROTEINUR NEGATIVE 07/31/2017 2128   NITRITE NEGATIVE 07/31/2017 2128   LEUKOCYTESUR NEGATIVE 07/31/2017 2128   Sepsis Labs: _0 (procalcitonin:4,lacticidven:4)  )No results found for this or any previous visit (from the past 240 hour(s)).       Radiology Studies: No results found.      Scheduled Meds: . aspirin  81 mg Oral Once per day on Mon Wed Fri  . atenolol  25 mg Oral Daily  . B-complex with vitamin C  1 tablet Oral Daily  . chlorhexidine  15 mL Mouth Rinse BID  . enoxaparin (LOVENOX) injection  40 mg Subcutaneous Q24H  . losartan  50 mg Oral Daily   And  . hydrochlorothiazide  12.5 mg Oral Daily  . isosorbide mononitrate  60 mg Oral Daily  . levothyroxine  75 mcg Oral QAC breakfast  . mouth rinse  15 mL Mouth Rinse q12n4p  . multivitamin with minerals  1 tablet Oral Daily  . [START ON 08/04/2017] pantoprazole  40 mg Oral Daily  . [START ON 08/04/2017] predniSONE  80 mg Oral Q breakfast  . rosuvastatin  10 mg Oral Once per day on Mon Thu   Continuous Infusions: . methylPREDNISolone (SOLU-MEDROL) injection Stopped (08/03/17 0849)     LOS: 3 days    Time spent: 36mn    PDomenic Polite MD [facility] Pager [phone] If 7PM-7AM, please contact night-coverage [email or web address] Password [email or web address] 08/03/2017, 2:21 PM

## 2017-08-03 NOTE — Op Note (Signed)
Preoperative diagnosis: vision loss  Postoperative diagnosis: same   Procedure: left temporal artery biopsy  Surgeon: Feliciana Rossetti, M.D.  Asst: none  Anesthesia: general  Indications for procedure: Jody Shaw is a 76 y.o. year old female with symptoms of vision loss and findings concerning for giant cell arteritis. After discussing the findings and plan for diagnostic biopsy, the patient agreed to proceed.  Description of procedure: The patient was brought into the operative suite. Anesthesia was administered with General LMA anesthesia. WHO checklist was applied. The patient was then placed in supine position. The area was prepped and draped in the usual sterile fashion.  The doppler was used to identify the vascular area and a sagittal incision was made superior to the ear. Blunt dissection was used to dissect through the subcutaneous tissues to identify the vascular bundle. This was dissected in both directions, clamped at ends, cut and removed and the ends were sutured with 2-0 silk. On further dissection of the area, one additional vessel was identified. This was dissected in both directions, clamped at both ends, cut and removed with the ends tied with 2-0 silk. The area dry and a 3-0 vicryl was used to close the deep dermal space and a 4-0 monocryl was used to close the skin in subcuticular fashion. Dermabond was put in place for dressing. The patient had some bradycardia during the surgery. The patient awoke from anesthesia and was brought to pacu in stable condition.  Findings: temporal artery identified and removed  Specimen: temporal vascular bundle  Implant: none   Blood loss: <45ml  Local anesthesia: 10 ml 0.5% marcaine   Complications: none  Feliciana Rossetti, M.D. General, Bariatric, & Minimally Invasive Surgery [facility]

## 2017-08-03 NOTE — Progress Notes (Signed)
To OR

## 2017-08-03 NOTE — Progress Notes (Signed)
Physical Therapy Treatment Patient Details Name: Jody Shaw MRN: [id number] DOB: [birth year] Today's Date: 08/03/2017    History of Present Illness Jody Shaw is a 76 y.o. 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 Improved balance with ambulation today, no loss of balance with head turns.   Follow Up Recommendations  No PT follow up (if balance  continues to be involved)     Equipment Recommendations  None recommended by PT    Recommendations for Other Services       Precautions / Restrictions Precautions Precautions: Fall Precaution Comments: left eye blindness Restrictions Weight Bearing Restrictions: No    Mobility  Bed Mobility Overal bed mobility: Independent                Transfers Overall transfer level: Needs assistance   Transfers: Sit to/from Stand Sit to Stand: Supervision         General transfer comment: for balance   Ambulation/Gait Ambulation/Gait assistance: Min assist Ambulation Distance (Feet): 400 Feet Assistive device: 1 person hand held assist Gait Pattern/deviations: Step-through pattern   Gait velocity interpretation: at or above normal speed for age/gender General Gait Details: pt steady today with head turns, no loss of balance, hand held assist of 1   Stairs            Wheelchair Mobility    Modified Rankin (Stroke Patients Only)       Balance Overall balance assessment: Needs assistance Sitting-balance support: No upper extremity supported;Feet supported Sitting balance-Leahy Scale: Normal     Standing balance support: During functional activity;No upper extremity supported Standing balance-Leahy Scale: Good                              Cognition Arousal/Alertness:  Awake/alert Behavior During Therapy: WFL for tasks assessed/performed Overall Cognitive Status: Within Functional Limits for tasks assessed                                        Exercises      General Comments        Pertinent Vitals/Pain Pain Assessment: No/denies pain    Home Living                      Prior Function            PT Goals (current goals can now be found in the care plan section) Acute Rehab PT Goals Patient Stated Goal: to get up and walk PT Goal Formulation: With patient Time For Goal Achievement: 08/08/17 Potential to Achieve Goals: Good Progress towards PT goals: Progressing toward goals    Frequency    Min 3X/week      PT Plan Current plan remains appropriate    Co-evaluation              AM-PAC PT "6 Clicks" Daily Activity  Outcome Measure  Difficulty turning over in bed (including adjusting bedclothes, sheets and blankets)?: None Difficulty moving from lying on back to sitting on the side of the bed? : None Difficulty sitting down on and standing up from a chair with arms (e.g., wheelchair, bedside commode, etc,.)?: A Little Help needed  moving to and from a bed to chair (including a wheelchair)?: A Little Help needed walking in hospital room?: A Little Help needed climbing 3-5 steps with a railing? : Total 6 Click Score: 18    End of Session Equipment Utilized During Treatment: Gait belt Activity Tolerance: Patient tolerated treatment well Patient left: with call bell/phone within reach;in bed Nurse Communication: Mobility status PT Visit Diagnosis: Difficulty in walking, not elsewhere classified (R26.2);Other symptoms and signs involving the nervous system (R29.898)     Time: 1610-9604 PT Time Calculation (min) (ACUTE ONLY): 26 min  Charges:  $Gait Training: 23-37 mins                    G Codes:          Tamala Ser 08/03/2017, 2:57 PM [phone]

## 2017-08-03 NOTE — Anesthesia Procedure Notes (Signed)
Date/Time: 08/03/2017 10:24 AM Performed by: Minerva Ends Oxygen Delivery Method: Simple face mask Ventilation: Oral airway inserted - appropriate to patient size Placement Confirmation: positive ETCO2 and breath sounds checked- equal and bilateral Dental Injury: Teeth and Oropharynx as per pre-operative assessment  Comments: LMA REMOVED--- OA--- SIMPLE FACE MASK--- GOOD aW TO PACU O2 INTACT

## 2017-08-04 LAB — GLUCOSE, CAPILLARY: Glucose-Capillary: 138 mg/dL — ABNORMAL HIGH (ref 65–99)

## 2017-08-04 MED ORDER — PREDNISONE 20 MG PO TABS: 60.0000 mg | ORAL_TABLET | Freq: Every day | ORAL | 1 refills | Status: DC

## 2017-08-04 MED ORDER — LANSOPRAZOLE 30 MG PO CPDR: 30.0000 mg | DELAYED_RELEASE_CAPSULE | Freq: Every day | ORAL | 0 refills | Status: DC

## 2017-08-04 NOTE — Progress Notes (Signed)
[facility] Office:  [phone] General Surgery Progress Note   LOS: 4 days  POD -  1 Day Post-Op  Chief Complaint: Loss of vision in left eye  Assessment and Plan: 1.  BIOPSY TEMPORAL ARTERY - 08/04/2017 - [facility]  Wound looks good.  May shower.  Follow up with Dr. Sheliah Hatch in our office in about 2 weeks.   Principal Problem:   Vision loss of left eye Active Problems:   Essential hypertension   CAD in native artery   Hyperlipidemia   HLD (hyperlipidemia)   GERD (gastroesophageal reflux disease)   Hypothyroidism   Difficulty swallowing   Hypokalemia   Giant cell arteritis (HCC)   Subjective:  Doing okay with left temporal incision.  Plans to go home today. Originally from [place].  Objective:   Vitals:   08/03/17 2026 08/04/17 0553  BP: 119/62 127/64  Pulse: 66 69  Resp: 16 16  Temp: 97.8 F (36.6 C) (!) 97.4 F (36.3 C)  SpO2: 96% 95%     Intake/Output from previous day:  09/21 0701 - 09/22 0700 In: 450 [I.V.:450] Out: 6 [Urine:1; Blood:5]  Intake/Output this shift:  Total I/O In: 240 [P.O.:240] Out: -    Physical Exam:   General: Older WF who is alert and oriented.    HEENT: Normal. Left temporal incision clean.   Lab Results:   No results for input(s): WBC, HGB, HCT, PLT in the last 72 hours.  BMET  No results for input(s): NA, K, CL, CO2, GLUCOSE, BUN, CREATININE, CALCIUM in the last 72 hours.  PT/INR  No results for input(s): LABPROT, INR in the last 72 hours.  ABG  No results for input(s): PHART, HCO3 in the last 72 hours.  Invalid input(s): PCO2, PO2   Studies/Results:  No results found.   Anti-infectives:   Anti-infectives    Start     Dose/Rate Route Frequency Ordered Stop   08/03/17 0926  ceFAZolin (ANCEF) 2-4 GM/100ML-% IVPB    Comments:  Curlene Dolphin   : cabinet override      08/03/17 0981 08/03/17 0947   08/03/17 0915  ceFAZolin (ANCEF) IVPB 2g/100 mL premix     2 g 200 mL/hr over 30 Minutes Intravenous  To Surgery 08/02/17 1703 08/03/17 1017   08/03/17 0600  ceFAZolin (ANCEF) IVPB 2g/100 mL premix  Status:  Discontinued     2 g 200 mL/hr over 30 Minutes Intravenous To ShortStay Surgical 08/02/17 1109 08/02/17 1702   08/02/17 1715  cefTRIAXone (ROCEPHIN) 2 g in dextrose 5 % 50 mL IVPB  Status:  Discontinued     2 g 100 mL/hr over 30 Minutes Intravenous Every 24 hours 08/02/17 1702 08/02/17 1703      Ovidio Kin, MD, FACS Pager: [phone] [facility] Office: [phone] 08/04/2017

## 2017-08-04 NOTE — Discharge Instructions (Signed)
Follow up with Dr. Sheliah Hatch - [phone] in 2 to 3 weeks.  Call for appointment. Okay to shower and wash wound.

## 2017-08-05 NOTE — Discharge Summary (Signed)
Physician Discharge Summary  Jody Shaw MWN:[id number] DOB: [birth year] DOA: 07/31/2017  PCP: Jody Bowen, MD  Admit date: 07/31/2017 Discharge date: 08/04/2017  Time spent: 35 minutes  Recommendations for Outpatient Follow-up:  1. Ophthalmology JodyGroat in 2-3days, please FU temporal artery biopsy 2. Outpatient RHeumatology FU in 1 week, urgent referral sent 3. PCP JodySouth please facilitate quick Rheum FU   Discharge Diagnoses:  Principal Problem:   Vision loss of left eye   GIant cell arteritis   Essential hypertension   CAD in native artery   Hyperlipidemia   HLD (hyperlipidemia)   GERD (gastroesophageal reflux disease)   Hypothyroidism   Difficulty swallowing   Hypokalemia   Giant cell arteritis ([place])   Discharge Condition: stable Diet recommendation: heart healthy  Filed Weights   08/01/17 0020 08/03/17 0916  Weight: 84.3 kg (185 lb 13.6 oz) 83.9 kg (185 lb)    History of present illness:  A [place] a 76 y.o.femalewith medical history significant of hypertension, hyperlipidemia, GERD, hypothyroidism, CAD, who presented with vision loss in [place]. Pt reported having blurry vision in left eye on Saturday and which progressively worsened. She lost most of her vision in left eye now, only light perception,  Patient went to see Dr. 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 Katy Jody Shaw recommended temporal artery biopsy in 6-10 days of starting steroids,  surgical consult obtained, seen by JodyKinsinger, s/p temporal artery biopsy today. -Will need rheumatology follow-up post discharge, I sent an urgent referral  HTN: -continue atenolol, Hyzaar  CAD in native artery: -stable -continued Aspirin, Crestor, atenolol and Imdur  HLD: -continue crestor  GERD: -Protonix  Hypokalemia:  -Repleted  Hypothyroidism: Last TSH was not on record -TSH low, cut down synthroid dose a little   Procedures: Procedure: left temporal artery biopsy  Consultations:  [facility]  Ophthalmology  Discharge Exam: Vitals:   08/03/17 2026 08/04/17 0553  BP: 119/62 127/64  Pulse: 66 69  Resp: 16 16  Temp: 97.8 F (36.6 C) (!) 97.4 F (36.3 C)  SpO2: 96% 95%    General: AAOx3 Cardiovascular: S1S2/RRR Respiratory: CTAB  Discharge Instructions   Discharge Instructions    Ambulatory referral to Rheumatology    Complete by:  As directed    Giant cell arteritis, biopsy results pending, needs urgent Rheumatology follow up   Diet - low sodium heart healthy    Complete by:  As directed    Increase activity slowly    Complete by:  As directed      Discharge Medication List as of 08/04/2017  8:59 AM    CONTINUE these medications which have CHANGED   Details  lansoprazole (PREVACID) 30 MG capsule Take 1 capsule (30 mg total) by mouth daily at 12 noon. For 10days, Starting Sat 08/04/2017, Print    predniSONE (DELTASONE) 20 MG tablet Take 3 tablets (60 mg total) by mouth daily with breakfast. Take 78m daily for 1 week and then subsequent tapering by JodyGroat or Rheumatologist as outpatient, Starting Sat 08/04/2017, Print  CONTINUE these medications which have NOT CHANGED   Details  aspirin 81 MG tablet Take 81 mg by mouth 3 (three) times a week., Historical Med    atenolol (TENORMIN) 25 MG tablet Take 1 tablet (25 mg total) by mouth daily. Please call and schedule a one year follow up appointment, Starting Fri 07/06/2017, Normal    b  complex vitamins capsule Take 1 capsule by mouth daily., Historical Med    isosorbide mononitrate (IMDUR) 60 MG 24 hr tablet Take 60 mg by mouth daily., Historical Med    levothyroxine (SYNTHROID, LEVOTHROID) 88 MCG tablet Take 88 mcg by mouth daily before breakfast., Historical Med    losartan-hydrochlorothiazide (HYZAAR) 50-12.5 MG per tablet Take 1 tablet by mouth daily., Historical Med    Multiple Vitamin (MULTI VITAMIN DAILY) TABS Take by mouth., Historical Med    rosuvastatin (CRESTOR) 10 MG tablet Take 1 tablet (10 mg total) by mouth 2 (two) times a week., Starting Mon 08/14/2016, Normal    Vitamin D, Ergocalciferol, (DRISDOL) 50000 UNITS CAPS capsule Take 50,000 Units by mouth every 7 (seven) days., Historical Med    nitroGLYCERIN (NITROSTAT) 0.4 MG SL tablet Place 0.4 mg under the tongue every 5 (five) minutes as needed for chest pain., Historical Med       Allergies  Allergen Reactions  . Iodine   . Septra [Sulfamethoxazole-Trimethoprim]   . Sulfa Antibiotics   . Watermelon [Citrullus Vulgaris]    Follow-up Information    Jody Bowen, MD. Schedule an appointment as soon as possible for a visit in 1 week(s).   Specialty:  Endocrinology Contact information: [place] [address] [phone]        Debbra Riding, MD Follow up on 08/06/2017.   Specialty:  Ophthalmology Contact information: [place] [address] [phone]            The results of significant diagnostics from this hospitalization (including imaging, microbiology, ancillary and laboratory) are listed below for reference.    Significant Diagnostic Studies: Ct Head Wo Contrast  Result Date: 07/31/2017 CLINICAL DATA:  Visual changes for the last 3 days, worsening. EXAM: CT HEAD WITHOUT CONTRAST TECHNIQUE: Contiguous axial images were obtained from the base of the skull through the vertex without intravenous contrast. COMPARISON:  None. FINDINGS:  Brain: Ventricles are normal in size and configuration. There is no mass, hemorrhage, edema or other evidence of acute parenchymal abnormality. No extra-axial hemorrhage. Vascular: There are chronic calcified atherosclerotic changes of the large vessels at the skull base. No unexpected hyperdense vessel. Skull: Normal. Negative for fracture or focal lesion. Sinuses/Orbits: No acute finding. Other: None. IMPRESSION: Negative head CT. No intracranial mass, hemorrhage or edema. Periorbital and retro-orbital soft tissues are unremarkable. Electronically Signed   By: Franki Cabot M.D.   On: 07/31/2017 21:40   Mr Brain Wo Contrast  Result Date: 07/31/2017 CLINICAL DATA:  Loss of vision in the left eye. Possible temporal arteritis. EXAM: MRI HEAD WITHOUT CONTRAST TECHNIQUE: Multiplanar, multiecho pulse sequences of the brain and surrounding structures were obtained without intravenous contrast. COMPARISON:  Head CT 07/31/2017 FINDINGS: Brain: The midline structures are normal. There is no focal diffusion restriction to indicate acute infarct. The brain parenchymal signal is normal and there is no mass lesion. No intraparenchymal hematoma or chronic microhemorrhage. Brain volume is normal for age without lobar predominant atrophy. The dura is normal and there is no extra-axial collection. Vascular: Major intracranial arterial and venous sinus flow voids are  preserved. Skull and upper cervical spine: The visualized skull base, calvarium, upper cervical spine and extracranial soft tissues are normal. Sinuses/Orbits: No fluid levels or advanced mucosal thickening. No mastoid or middle ear effusion. Normal orbits. IMPRESSION: 1. No acute intracranial abnormality. 2. No focal lesion along the optic pathways or other finding to explain the reported vision loss. Electronically Signed   By: Ulyses Jarred M.D.   On: 07/31/2017 23:24    Microbiology: Recent Results (from the past 240 hour(s))  Surgical PCR screen      Status: Abnormal   Collection Time: 08/03/17  8:01 AM  Result Value Ref Range Status   MRSA, PCR NEGATIVE NEGATIVE Final   Staphylococcus aureus POSITIVE (A) NEGATIVE Final    Comment: (NOTE) The Xpert SA Assay (FDA approved for NASAL specimens in patients 77 years of age and older), is one component of a comprehensive surveillance program. It is not intended to diagnose infection nor to guide or monitor treatment.      Labs: Basic Metabolic Panel:  Recent Labs Lab 07/31/17 1928 08/01/17 0449  NA 137 140  K 3.4* 4.0  CL 103 107  CO2 22 21*  GLUCOSE 133* 142*  BUN 23* 22*  CREATININE 0.98 0.90  CALCIUM 9.1 9.2   Liver Function Tests:  Recent Labs Lab 07/31/17 1928  AST 29  ALT 19  ALKPHOS 72  BILITOT 0.4  PROT 8.3*  ALBUMIN 3.4*   No results for input(s): LIPASE, AMYLASE in the last 168 hours. No results for input(s): AMMONIA in the last 168 hours. CBC:  Recent Labs Lab 07/31/17 1928  WBC 11.2*  NEUTROABS 10.4*  HGB 10.9*  HCT 32.3*  MCV 80.8  PLT 398   Cardiac Enzymes: No results for input(s): CKTOTAL, CKMB, CKMBINDEX, TROPONINI in the last 168 hours. BNP: BNP (last 3 results) No results for input(s): BNP in the last 8760 hours.  ProBNP (last 3 results) No results for input(s): PROBNP in the last 8760 hours.  CBG:  Recent Labs Lab 08/01/17 0747 08/02/17 0744 08/03/17 0810 08/04/17 0732  GLUCAP 144* 141* 139* 138*       SignedDomenic Polite MD.  [facility] 08/05/2017, 1:13 PM

## 2017-08-06 NOTE — Anesthesia Postprocedure Evaluation (Signed)
Anesthesia Post Note  Patient: Jody Shaw  Procedure(s) Performed: Procedure(s) (LRB): BIOPSY TEMPORAL ARTERY (Left)     Patient location during evaluation: PACU Anesthesia Type: General Level of consciousness: awake and alert Pain management: pain level controlled Vital Signs Assessment: post-procedure vital signs reviewed and stable Respiratory status: spontaneous breathing, nonlabored ventilation and respiratory function stable Cardiovascular status: blood pressure returned to baseline and stable Postop Assessment: no apparent nausea or vomiting Anesthetic complications: no    Last Vitals:  Vitals:   08/03/17 2026 08/04/17 0553  BP: 119/62 127/64  Pulse: 66 69  Resp: 16 16  Temp: 36.6 C (!) 36.3 C  SpO2: 96% 95%    Last Pain:  Vitals:   08/04/17 0553  TempSrc: Oral  PainSc:    Pain Goal: Patients Stated Pain Goal: 4 (08/03/17 2115)               Cecile Hearing

## 2017-08-31 ENCOUNTER — Other Ambulatory Visit: Payer: Self-pay | Admitting: Interventional Cardiology

## 2017-08-31 NOTE — Telephone Encounter (Signed)
Please call office and schedule appointment for further refills [phone] 

## 2017-09-13 ENCOUNTER — Encounter (INDEPENDENT_AMBULATORY_CARE_PROVIDER_SITE_OTHER): Payer: Self-pay

## 2017-09-13 ENCOUNTER — Ambulatory Visit (INDEPENDENT_AMBULATORY_CARE_PROVIDER_SITE_OTHER): Payer: Medicare Other | Admitting: Cardiology

## 2017-09-13 ENCOUNTER — Encounter: Payer: Self-pay | Admitting: Cardiology

## 2017-09-13 VITALS — BP 140/62 | HR 60 | Ht 63.0 in | Wt 201.8 lb

## 2017-09-13 DIAGNOSIS — M79662 Pain in left lower leg: Secondary | ICD-10-CM | POA: Diagnosis not present

## 2017-09-13 DIAGNOSIS — R609 Edema, unspecified: Secondary | ICD-10-CM | POA: Diagnosis not present

## 2017-09-13 DIAGNOSIS — I2510 Atherosclerotic heart disease of native coronary artery without angina pectoris: Secondary | ICD-10-CM | POA: Diagnosis not present

## 2017-09-13 DIAGNOSIS — M316 Other giant cell arteritis: Secondary | ICD-10-CM | POA: Diagnosis not present

## 2017-09-13 DIAGNOSIS — I10 Essential (primary) hypertension: Secondary | ICD-10-CM

## 2017-09-13 DIAGNOSIS — E782 Mixed hyperlipidemia: Secondary | ICD-10-CM

## 2017-09-13 DIAGNOSIS — R600 Localized edema: Secondary | ICD-10-CM

## 2017-09-13 MED ORDER — ISOSORBIDE MONONITRATE ER 60 MG PO TB24: 60.0000 mg | ORAL_TABLET | Freq: Every day | ORAL | 1 refills | Status: DC

## 2017-09-13 MED ORDER — POTASSIUM CHLORIDE ER 10 MEQ PO TBCR: 10.0000 meq | EXTENDED_RELEASE_TABLET | Freq: Every day | ORAL | 1 refills | Status: DC | PRN

## 2017-09-13 MED ORDER — FUROSEMIDE 20 MG PO TABS: 20.0000 mg | ORAL_TABLET | Freq: Every day | ORAL | 1 refills | Status: DC | PRN

## 2017-09-13 MED ORDER — ATENOLOL 25 MG PO TABS: 25.0000 mg | ORAL_TABLET | Freq: Every day | ORAL | 1 refills | Status: DC

## 2017-09-13 NOTE — Progress Notes (Signed)
Cardiology Office Note   Date:  09/13/2017   ID:  Jody Shaw, DOB 1940-12-05, MRN [id number]  PCP:  Adrian [facility], Stephen, MD  Cardiologist:  Dr. Katrinka BlazingSmith     Chief Complaint  Patient presents with  . Coronary Artery Disease      History of Present Illness: Jody Shaw is a 76 y.o. female who presents for CAD.   She has a hx of HTN, HLD, and CAD with prior LAD rotational atherectomy remote.  Last visit 08/11/16 and was stable.  She is on IMDUR, hyzaar and crestor followed by PCP.   She was recently [place] with loss of [place] and had lt temporal artery biopsy placed on high dose solumedrol and then po prednisone. -MRI neg for CVA.  Sed rate at 120. 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     EKG then SR incomplete RBBB and no changes.    Today pt is learning to deal with her vision loss in lt eye.  She is taking her meds and injection that she does not remember name.  She has no chest pain and no SOB but she does have lower ext edema Rt > Lt but left calf with pain.  No redness.  At times she can hardly walk on that leg.      Past Medical History:  Diagnosis Date  . Coronary artery disease   . GERD (gastroesophageal reflux disease)   . Giant cell arteritis ([facility])   . HLD (hyperlipidemia)   . Hypothyroidism   . Vision loss of left eye     Past Surgical History:  Procedure Laterality Date  . ARTERY BIOPSY Left 08/03/2017   Procedure: BIOPSY TEMPORAL ARTERY;  Surgeon: Sheliah HatchKinsinger, De BlanchLuke Aaron, MD;  Location: [facility];  Service: General;  Laterality: Left;     Current Outpatient Prescriptions  Medication Sig Dispense Refill  . aspirin 81 MG tablet Take 81 mg by mouth daily.     Marland Kitchen. atenolol (TENORMIN) 25 MG tablet Take 1 tablet (25 mg total) by mouth daily. 90 tablet 1  . b complex vitamins capsule Take 1 capsule by mouth daily.    . isosorbide mononitrate (IMDUR) 60 MG 24 hr tablet Take 1 tablet (60 mg total) by mouth daily.  90 tablet 1  . lansoprazole (PREVACID) 30 MG capsule Take 30 mg by mouth as needed (HEARTBURN).    Marland Kitchen. levothyroxine (SYNTHROID, LEVOTHROID) 88 MCG tablet Take 88 mcg by mouth daily before breakfast.    . losartan-hydrochlorothiazide (HYZAAR) 50-12.5 MG per tablet Take 1 tablet by mouth daily.    . Multiple Vitamin (MULTI VITAMIN DAILY) TABS Take 2 tablets by mouth daily.     . nitroGLYCERIN (NITROSTAT) 0.4 MG SL tablet Place 0.4 mg under the tongue every 5 (five) minutes as needed for chest pain.    . predniSONE (DELTASONE) 10 MG tablet Take 10 mg by mouth daily.    . predniSONE (DELTASONE) 20 MG tablet TAKE TWO (2)  20 MG TABLETS (40 MG TOTAL)  BY MOUTH DAILY    . rosuvastatin (CRESTOR) 10 MG tablet TAKE 1 TABLET (10 MG TOTAL) BY MOUTH 2 (TWO) TIMES A WEEK. 30 tablet 0  . Vitamin D, Ergocalciferol, (DRISDOL) 50000 UNITS CAPS capsule Take 50,000 Units by mouth every 7 (seven) days.    . furosemide (LASIX) 20 MG tablet Take 1 tablet (20 mg total) by mouth daily as needed. Take 1 tablets 90 tablet 1  .  potassium chloride (K-DUR) 10 MEQ tablet Take 1 tablet (10 mEq total) by mouth daily as needed. 90 tablet 1   No current facility-administered medications for this visit.     Allergies:   Iodine; Septra [sulfamethoxazole-trimethoprim]; Sulfa antibiotics; and Watermelon [citrullus vulgaris]    Social History:  The patient  reports that she has quit smoking. She has never used smokeless tobacco. She reports that she does not drink alcohol or use drugs.   Family History:  The patient's family history includes Heart disease in her father and mother.    ROS:  General:no colds or fevers, no weight changes Skin:no rashes or ulcers HEENT:no blurred vision, + loss of vision in Lt eye no congestion CV:see HPI PUL:see HPI GI:no diarrhea constipation or melena, no indigestion GU:no hematuria, no dysuria MS:no joint pain, no claudication Neuro:no syncope, no lightheadedness Endo:no diabetes, no  thyroid disease  Wt Readings from Last 3 Encounters:  09/13/17 201 lb 12.8 oz (91.5 kg)  08/03/17 185 lb (83.9 kg)  07/31/17 186 lb (84.4 kg)     PHYSICAL EXAM: VS:  BP 140/62   Pulse 60   Ht 5\' 3"  (1.6 m)   Wt 201 lb 12.8 oz (91.5 kg)   SpO2 95%   BMI 35.75 kg/m  , BMI Body mass index is 35.75 kg/m. General:Pleasant affect, NAD Skin:Warm and dry, brisk capillary refill HEENT:normocephalic, sclera clear, mucus membranes moist Neck:supple, no JVD, no bruits  Heart:S1S2 RRR without murmur, gallup, rub or click Lungs:clear without rales, rhonchi, or wheezes ZOX:WRUE, non tender, + BS, do not palpate liver spleen or masses Ext:2+ rt  lower ext edema, and 1-2+ Lt lower ext edema with pain on compression of calf on Lt only 1+ pedal pulses, 2+ radial pulses Neuro:alert and oriented X 3, MAE, follows commands, + facial symmetry    EKG:  EKG is NOT ordered today. The ekg from  08/01/17 in hospital were reviewed.     Recent Labs: 07/31/2017: ALT 19; Hemoglobin 10.9; Platelets 398 08/01/2017: BUN 22; Creatinine, Ser 0.90; Potassium 4.0; Sodium 140; TSH 0.203    Lipid Panel No results found for: CHOL, TRIG, HDL, CHOLHDL, VLDL, LDLCALC, LDLDIRECT     Other studies Reviewed: Additional studies/ records that were reviewed today include: see above.   ASSESSMENT AND PLAN:  1.  CAD in native artery hx of stent  no chest pain.  Continue Imdur -refilled, she will follow up with Dr. Katrinka Blazing in 6 months.  2..  HTN controlled on current meds BB, and losartan hctz  3.  Lower ext edema, no HF but her legs are bothering her with difficulty walking so will add lasix 40 X 1 day then 20 mg prn along with kdur 20 meq X 1 day then 10 meq prn edema.  She will call if no improvement and will check BmP next week.  4... Lower Lt leg pain, concern for DVT will check Lt leg venous dopplers.  5.  HLD on statin followed by Dr. Evlyn Kanner  6.  Giant cell arteritis on steroids - loss of vision in Lt eye.  Followed by Rheumatology.       Current medicines are reviewed with the patient today.  The patient Has no concerns regarding medicines.  The following changes have been made:  See above Labs/ tests ordered today include:see above  Disposition:   FU:  see above  Signed, Nada Boozer, NP  09/13/2017 4:59 PM    [facility] [address] [place],  [place], [place], [place] Phone: [phone]; Fax: [phone]

## 2017-09-13 NOTE — Patient Instructions (Signed)
Medication Instructions: Your physician has recommended you make the following change in your medication:  -1) START Furosemide (Lasix) 20 mg - Take 2 tablets (40 mg) by mouth TOMORROW, then take 1 tablet by mouth as needed for swelling -2) START Potassium Chloride (K-DUR) 10 meq - Take 2 tablets (20 meq) by mouth TOMORROW, then take 1 tablet by mouth as needed when you take lasix  Labwork: Your physician recommends that you return for lab work in 1 WEEK - BMET  Procedures/Testing: Your physician has requested that you have a lower extremity venous duplex. This test is an ultrasound of the veins in the legs or arms. It looks at venous blood flow that carries blood from the heart to the legs or arms. Allow one hour for a Lower Venous exam. Allow thirty minutes for an Upper Venous exam. There are no restrictions or special instructions.   Follow-Up: Your physician recommends that you schedule a follow-up appointment in: 1 MONTH with Dr. Katrinka BlazingSmith or Nada BoozerLaura Ingold, NP   Any Additional Special Instructions Will Be Listed Below (If Applicable).     If you need a refill on your cardiac medications before your next appointment, please call your pharmacy.

## 2017-09-14 ENCOUNTER — Ambulatory Visit (HOSPITAL_COMMUNITY)
Admission: RE | Admit: 2017-09-14 | Discharge: 2017-09-14 | Disposition: A | Discharge status: Home / Self Care | Payer: Medicare Other | Source: Ambulatory Visit | Attending: Cardiology | Admitting: Cardiology

## 2017-09-14 DIAGNOSIS — R600 Localized edema: Secondary | ICD-10-CM | POA: Diagnosis present

## 2017-09-14 DIAGNOSIS — M79605 Pain in left leg: Secondary | ICD-10-CM | POA: Insufficient documentation

## 2017-09-14 NOTE — Progress Notes (Addendum)
Left lower extremity completed. No evidence of DVT, SVT, or Baker's cyst.  Jody Shaw,RVS 09/14/2017

## 2017-09-20 ENCOUNTER — Other Ambulatory Visit: Payer: Medicare Other | Admitting: *Deleted

## 2017-09-20 DIAGNOSIS — R609 Edema, unspecified: Secondary | ICD-10-CM

## 2017-09-20 DIAGNOSIS — R600 Localized edema: Secondary | ICD-10-CM

## 2017-09-20 DIAGNOSIS — I10 Essential (primary) hypertension: Secondary | ICD-10-CM

## 2017-09-20 LAB — BASIC METABOLIC PANEL
BUN/Creatinine Ratio: 28 (ref 12–28)
BUN: 34 mg/dL — AB (ref 8–27)
CALCIUM: 9.3 mg/dL (ref 8.7–10.3)
CHLORIDE: 99 mmol/L (ref 96–106)
CO2: 24 mmol/L (ref 20–29)
Creatinine, Ser: 1.21 mg/dL — ABNORMAL HIGH (ref 0.57–1.00)
GFR calc non Af Amer: 43 mL/min/{1.73_m2} — ABNORMAL LOW (ref >59)
GFR, EST AFRICAN AMERICAN: 50 mL/min/{1.73_m2} — AB (ref >59)
GLUCOSE: 126 mg/dL — AB (ref 65–99)
POTASSIUM: 4 mmol/L (ref 3.5–5.2)
Sodium: 139 mmol/L (ref 134–144)

## 2017-10-16 NOTE — Progress Notes (Signed)
Cardiology Office Note   Date:  10/18/2017   ID:  Jody Shaw, DOB 11-07-1941, MRN [id number]  PCP:  Adrian PrinceSouth, Stephen, MD  Cardiologist:  Dr. Laurier NancySmith    Presents for follow up but does have dizziness.    History of Present Illness: Jody Shaw is a 76 y.o. female who presents for  HTN and CAD but does complain of dizziness.    She has a hx of HTN, HLD, and CAD with prior LAD rotational atherectomy remote.  Last visit 08/11/16 and was stable.  She is on IMDUR, hyzaar and crestor followed by PCP.   She [place] and had lt temporal artery biopsy placed on high dose solumedrol and then po prednisone. -MRI neg for CVA.  Sed rate at 120. 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     EKG then SR incomplete RBBB and no changes.    Today pt is still learning to deal with her vision loss in lt eye.  She is taking her meds and injection that she does not remember name.  She has no chest pain and no SOB but she does have lower ext edema equal on this visit.  She has trouble getting the compression stockings off.   No redness. On last visit her lt leg was painful but venous doppler neg for DVT.  She is slowly weaning steroids and does have a "moon" face.     Past Medical History:  Diagnosis Date  . Coronary artery disease   . GERD (gastroesophageal reflux disease)   . Giant cell arteritis (HCC)   . HLD (hyperlipidemia)   . Hypothyroidism   . Vision loss of left eye     Past Surgical History:  Procedure Laterality Date  . ARTERY BIOPSY Left 08/03/2017   Procedure: BIOPSY TEMPORAL ARTERY;  Surgeon: Sheliah HatchKinsinger, De BlanchLuke Aaron, MD;  Location: [facility];  Service: General;  Laterality: Left;     Current Outpatient Medications  Medication Sig Dispense Refill  . aspirin 81 MG tablet Take 81 mg by mouth daily.     Marland Kitchen. atenolol (TENORMIN) 25 MG tablet Take 1 tablet (25 mg total) by mouth daily. 90 tablet 1  . b  complex vitamins capsule Take 1 capsule by mouth daily.    . isosorbide mononitrate (IMDUR) 60 MG 24 hr tablet Take 1 tablet (60 mg total) by mouth daily. 90 tablet 1  . lansoprazole (PREVACID) 30 MG capsule Take 30 mg by mouth as needed (HEARTBURN).    Marland Kitchen. levothyroxine (SYNTHROID, LEVOTHROID) 88 MCG tablet Take 88 mcg by mouth daily before breakfast.    . losartan-hydrochlorothiazide (HYZAAR) 50-12.5 MG per tablet Take 1 tablet by mouth daily.    . Multiple Vitamin (MULTI VITAMIN DAILY) TABS Take 2 tablets by mouth daily.     . nitroGLYCERIN (NITROSTAT) 0.4 MG SL tablet Place 0.4 mg under the tongue every 5 (five) minutes as needed for chest pain.    . predniSONE (DELTASONE) 10 MG tablet Take 10 mg by mouth daily.    . predniSONE (DELTASONE) 20 MG tablet TAKE TWO (2)  20 MG TABLETS (40 MG TOTAL)  BY MOUTH DAILY    . rosuvastatin (CRESTOR) 10 MG tablet TAKE 1 TABLET (10 MG TOTAL) BY MOUTH 2 (TWO) TIMES A WEEK. 30 tablet 0  . Vitamin D, Ergocalciferol, (DRISDOL) 50000 UNITS CAPS capsule Take 50,000 Units by mouth every 7 (seven) days.  No current facility-administered medications for this visit.     Allergies:   Iodine; Septra [sulfamethoxazole-trimethoprim]; Sulfa antibiotics; and Watermelon [citrullus vulgaris]    Social History:  The patient  reports that she has quit smoking. she has never used smokeless tobacco. She reports that she does not drink alcohol or use drugs.   Family History:  The patient's family history includes Heart disease in her father and mother.    ROS:  General:no colds or fevers, + weight gain with steroids and increase of appetite Skin:no rashes or ulcers HEENT:+ blurred vision in Lt eye and no vision in Rt eye, no congestion CV:see HPI PUL:see HPI GI:no diarrhea constipation or melena, no indigestion GU:no hematuria, no dysuria MS:no joint pain, no claudication Neuro:no syncope, no lightheadedness Endo:no diabetes, + thyroid disease  Wt Readings from  Last 3 Encounters:  10/18/17 203 lb 6.4 oz (92.3 kg)  09/13/17 201 lb 12.8 oz (91.5 kg)  08/03/17 185 lb (83.9 kg)     PHYSICAL EXAM: VS:  BP (!) 146/62   Pulse 89   Resp 16   Ht 5\' 2"  (1.575 m)   Wt 203 lb 6.4 oz (92.3 kg)   SpO2 97%   BMI 37.20 kg/m  , BMI Body mass index is 37.2 kg/m. General:Pleasant affect, NAD Skin:Warm and dry, brisk capillary refill HEENT:normocephalic, sclera clear, mucus membranes moist, face round Neck:supple, no JVD, no bruits  Heart:S1S2 RRR without murmur, gallup, rub or click Lungs:clear without rales, rhonchi, or wheezes NGE:XBMWAbd:soft, non tender, + BS, do not palpate liver spleen or masses Ext:2-3+ lower ext edema, 2+ pedal pulses, 2+ radial pulses Neuro:alert and oriented X 3, MAE, follows commands, + facial symmetry    EKG:  EKG is NOT ordered today.   Recent Labs: 07/31/2017: ALT 19; Hemoglobin 10.9; Platelets 398 08/01/2017: TSH 0.203 09/20/2017: BUN 34; Creatinine, Ser 1.21; Potassium 4.0; Sodium 139    Lipid Panel No results found for: CHOL, TRIG, HDL, CHOLHDL, VLDL, LDLCALC, LDLDIRECT     Other studies Reviewed: Additional studies/ records that were reviewed today include:  Venous doppler neg..  Last nuc was neg. 07/2012  ASSESSMENT AND PLAN:  1.  Edema will resume lasix and recheck BMP in 1 week.  She has no SOB at this time.  She only took a day last time due to renal insuff.  Keep follow up with Dr. Katrinka BlazingSmith, will check back with her with lab work next week.  She does not use much salt.   2.  HTN, elevated but lasix should decrease.  3.  CAD in native artery with PCI in past.  Continue Imdur  4.   Giant cell arteritis followed by rheumatology and slowly weaning steroids.     Current medicines are reviewed with the patient today.  The patient Has no concerns regarding medicines.  The following changes have been made:  See above Labs/ tests ordered today include:see above  Disposition:   FU:  see above  Signed, Nada BoozerLaura  Stellar Gensel, NP  10/18/2017 10:42 AM    [facility] [address], [place], [place]  [address]/ [address] [place], [place] Phone: [phone]) [phone]; Fax: [phone]) [phone]  [phone]

## 2017-10-18 ENCOUNTER — Ambulatory Visit: Payer: Medicare Other | Admitting: Cardiology

## 2017-10-18 ENCOUNTER — Encounter: Payer: Self-pay | Admitting: Cardiology

## 2017-10-18 VITALS — BP 146/62 | HR 89 | Resp 16 | Ht 62.0 in | Wt 203.4 lb

## 2017-10-18 DIAGNOSIS — I10 Essential (primary) hypertension: Secondary | ICD-10-CM | POA: Diagnosis not present

## 2017-10-18 DIAGNOSIS — I2510 Atherosclerotic heart disease of native coronary artery without angina pectoris: Secondary | ICD-10-CM | POA: Diagnosis not present

## 2017-10-18 DIAGNOSIS — N289 Disorder of kidney and ureter, unspecified: Secondary | ICD-10-CM

## 2017-10-18 DIAGNOSIS — M316 Other giant cell arteritis: Secondary | ICD-10-CM

## 2017-10-18 DIAGNOSIS — R609 Edema, unspecified: Secondary | ICD-10-CM

## 2017-10-18 MED ORDER — POTASSIUM CHLORIDE ER 10 MEQ PO TBCR: 10.0000 meq | EXTENDED_RELEASE_TABLET | Freq: Every day | ORAL | 3 refills | Status: DC

## 2017-10-18 MED ORDER — FUROSEMIDE 20 MG PO TABS
20.0000 mg | ORAL_TABLET | Freq: Every day | ORAL | 3 refills | Status: DC
Start: 2017-10-18 — End: 2018-04-29

## 2017-10-18 NOTE — Patient Instructions (Addendum)
Medication Instructions:  1. START LASIX 20 MG DAILY; NEW RX HAS BEEN SENT IN  2. START POTASSIUM 10 MEQ DAILY; NEW RX HAS BEEN SENT IN   Labwork: BMET TO BE DONE IN 1 WEEK  Testing/Procedures: NONE ORDERED TODAY  Follow-Up: 01/16/18 @ 11:20 WITH DR. Katrinka BlazingSMITH   Any Other Special Instructions Will Be Listed Below (If Applicable).     If you need a refill on your cardiac medications before your next appointment, please call your pharmacy.

## 2017-10-24 ENCOUNTER — Telehealth: Payer: Self-pay | Admitting: Cardiology

## 2017-10-24 ENCOUNTER — Other Ambulatory Visit: Payer: Medicare Other

## 2017-10-24 NOTE — Telephone Encounter (Signed)
Patient calling and states that she was suppose to have her labs checked again today (BMET) since restarting the lasix and potassium. Patient states that she had her labs checked with Dr. Evlyn KannerSouth earlier this week and she is going back to see him today. She states that she is going to have him fax over the results to our office that way we can determine whether or not she should continue the lasix and potassium. She states that she has cancelled her lab appointment for here today and will wait to hear back from our office for further recommendation. Made her aware that the information would be forwarded.

## 2017-10-24 NOTE — Telephone Encounter (Signed)
New Message Pt call requesting to speak with Rn about lab work. Please call back to discuss

## 2017-11-23 ENCOUNTER — Other Ambulatory Visit: Payer: Self-pay | Admitting: Interventional Cardiology

## 2017-11-23 MED ORDER — ROSUVASTATIN CALCIUM 10 MG PO TABS: 10.0000 mg | ORAL_TABLET | ORAL | 11 refills | Status: DC

## 2017-11-26 MED ORDER — ROSUVASTATIN CALCIUM 10 MG PO TABS: 10.0000 mg | ORAL_TABLET | ORAL | 3 refills | Status: DC

## 2017-11-26 NOTE — Addendum Note (Signed)
Addended by: Demetrios LollBARNARD, CATHY C on: 11/26/2017 03:51 PM   Modules accepted: Orders

## 2018-01-16 ENCOUNTER — Ambulatory Visit: Payer: Medicare Other | Admitting: Interventional Cardiology

## 2018-02-17 ENCOUNTER — Encounter (HOSPITAL_COMMUNITY): Payer: Self-pay | Admitting: Emergency Medicine

## 2018-02-17 ENCOUNTER — Emergency Department (HOSPITAL_COMMUNITY): Payer: Medicare Other

## 2018-02-17 ENCOUNTER — Emergency Department (HOSPITAL_COMMUNITY)
Admission: EM | Admit: 2018-02-17 | Discharge: 2018-02-17 | Disposition: A | Discharge status: Home / Self Care | Payer: Medicare Other | Attending: Emergency Medicine | Admitting: Emergency Medicine

## 2018-02-17 DIAGNOSIS — R110 Nausea: Secondary | ICD-10-CM | POA: Diagnosis not present

## 2018-02-17 DIAGNOSIS — Z5321 Procedure and treatment not carried out due to patient leaving prior to being seen by health care provider: Secondary | ICD-10-CM | POA: Insufficient documentation

## 2018-02-17 DIAGNOSIS — R5383 Other fatigue: Secondary | ICD-10-CM | POA: Insufficient documentation

## 2018-02-17 LAB — COMPREHENSIVE METABOLIC PANEL
ALBUMIN: 3.4 g/dL — AB (ref 3.5–5.0)
ALT: 33 U/L (ref 14–54)
AST: 25 U/L (ref 15–41)
Alkaline Phosphatase: 63 U/L (ref 38–126)
Anion gap: 13 (ref 5–15)
BUN: 34 mg/dL — AB (ref 6–20)
CHLORIDE: 99 mmol/L — AB (ref 101–111)
CO2: 26 mmol/L (ref 22–32)
CREATININE: 1.59 mg/dL — AB (ref 0.44–1.00)
Calcium: 8.8 mg/dL — ABNORMAL LOW (ref 8.9–10.3)
GFR calc Af Amer: 35 mL/min — ABNORMAL LOW (ref >60)
GFR, EST NON AFRICAN AMERICAN: 30 mL/min — AB (ref >60)
Glucose, Bld: 138 mg/dL — ABNORMAL HIGH (ref 65–99)
POTASSIUM: 3.3 mmol/L — AB (ref 3.5–5.1)
SODIUM: 138 mmol/L (ref 135–145)
Total Bilirubin: 1.5 mg/dL — ABNORMAL HIGH (ref 0.3–1.2)
Total Protein: 7.4 g/dL (ref 6.5–8.1)

## 2018-02-17 LAB — CBC
HEMATOCRIT: 38.9 % (ref 36.0–46.0)
Hemoglobin: 13.6 g/dL (ref 12.0–15.0)
MCH: 32.7 pg (ref 26.0–34.0)
MCHC: 35 g/dL (ref 30.0–36.0)
MCV: 93.5 fL (ref 78.0–100.0)
PLATELETS: 153 10*3/uL (ref 150–400)
RBC: 4.16 MIL/uL (ref 3.87–5.11)
RDW: 13.3 % (ref 11.5–15.5)
WBC: 19.9 10*3/uL — AB (ref 4.0–10.5)

## 2018-02-17 LAB — LIPASE, BLOOD: LIPASE: 29 U/L (ref 11–51)

## 2018-02-17 MED ORDER — ONDANSETRON 4 MG PO TBDP: 4.0000 mg | ORAL_TABLET | Freq: Once | ORAL | Status: DC | PRN

## 2018-02-17 NOTE — ED Notes (Signed)
Pt came up to registration personnel and states she is leaving and gave them her labels and left.

## 2018-02-17 NOTE — ED Triage Notes (Signed)
Pt reports for 2-3 weeks been feeling tired and nausea. Reports had PNA week ago and took course of antibiotics. Denies diarrhea but denies any eating thing and just drinking Ginger ale.

## 2018-02-20 ENCOUNTER — Other Ambulatory Visit: Payer: Self-pay | Admitting: Cardiology

## 2018-02-20 NOTE — Telephone Encounter (Signed)
Outpatient Medication Detail    Disp Refills Start End   isosorbide mononitrate (IMDUR) 60 MG 24 hr tablet 90 tablet 1 09/13/2017    Sig - Route: Take 1 tablet (60 mg total) by mouth daily. - Oral   Sent to pharmacy as: isosorbide mononitrate (IMDUR) 60 MG 24 hr tablet   E-Prescribing Status: Receipt confirmed by pharmacy (09/13/2017 12:29 PM EDT)   Pharmacy   [phone] [facility] [place] - [address]

## 2018-03-26 ENCOUNTER — Other Ambulatory Visit: Payer: Self-pay | Admitting: Cardiology

## 2018-04-05 ENCOUNTER — Other Ambulatory Visit: Payer: Self-pay | Admitting: Cardiology

## 2018-04-09 ENCOUNTER — Other Ambulatory Visit: Payer: Self-pay | Admitting: Cardiology

## 2018-04-10 ENCOUNTER — Other Ambulatory Visit: Payer: Self-pay | Admitting: Cardiology

## 2018-04-19 ENCOUNTER — Ambulatory Visit: Payer: Medicare Other | Admitting: Interventional Cardiology

## 2018-04-28 NOTE — Progress Notes (Signed)
Cardiology Office Note    Date:  04/29/2018   ID:  Jody BeganMargery A Bina, DOB 1941-07-03, MRN [id number]  PCP:  Adrian PrinceSouth, Stephen, MD  Cardiologist: Lesleigh NoeHenry W Smith III, MD   Chief Complaint  Patient presents with  . Coronary Artery Disease    History of Present Illness:  Jody Shaw is a 77 y.o. female has a hx of HTN, HLD, and CAD with prior LAD rotational atherectomy remote. Last visit 08/11/16 and was stable. 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  As noted above, recently diagnosed with giant cell arteritis which has caused blindness in the left eye and decreased vision in the right.  Has been on initially high-dose steroids now tapering.  Has had significant fluid accumulation.  Furosemide has been added to help with fluid balance.  She denies orthopnea and PND.  Depressed because she is unable to drive and quality of life is decreased.  Denies orthopnea and PND.   Past Medical History:  Diagnosis Date  . Coronary artery disease   . GERD (gastroesophageal reflux disease)   . Giant cell arteritis (HCC)   . HLD (hyperlipidemia)   . Hypothyroidism   . Vision loss of left eye     Past Surgical History:  Procedure Laterality Date  . ARTERY BIOPSY Left 08/03/2017   Procedure: BIOPSY TEMPORAL ARTERY;  Surgeon: Sheliah HatchKinsinger, De BlanchLuke Aaron, MD;  Location: [facility];  Service: General;  Laterality: Left;    Current Medications: Outpatient Medications Prior to Visit  Medication Sig Dispense Refill  . aspirin 81 MG tablet Take 81 mg by mouth daily.     Marland Kitchen. atenolol (TENORMIN) 25 MG tablet TAKE 1 TABLET BY MOUTH EVERY DAY 90 tablet 1  . b complex vitamins capsule Take 1 capsule by mouth daily.    . furosemide (LASIX) 20 MG tablet Take 20 mg by mouth daily as needed (swelling).    . isosorbide mononitrate (IMDUR) 60 MG 24 hr tablet Take 1 tablet (60 mg total) by mouth daily. Must keep appt for future reffills 90 tablet 2  . lansoprazole (PREVACID) 30 MG capsule Take 30  mg by mouth daily as needed (HEARTBURN).     Marland Kitchen. levothyroxine (SYNTHROID, LEVOTHROID) 88 MCG tablet Take 88 mcg by mouth daily before breakfast.    . losartan (COZAAR) 50 MG tablet Take 50 mg by mouth daily.  2  . Multiple Vitamin (MULTI VITAMIN DAILY) TABS Take 2 tablets by mouth daily.     . nitroGLYCERIN (NITROSTAT) 0.4 MG SL tablet Place 0.4 mg under the tongue every 5 (five) minutes as needed for chest pain.    . potassium chloride (K-DUR) 10 MEQ tablet Take 10 mEq by mouth daily as needed (Take with Lasix for swelling.).    Marland Kitchen. predniSONE (DELTASONE) 1 MG tablet Take 3 mg by mouth daily. Take with Prednisone 5 mg to equal 8 mg total.    . predniSONE (DELTASONE) 5 MG tablet Take 5 mg by mouth daily with breakfast. Take with Prednisone 3 mg to equal 8 mg total.    . rosuvastatin (CRESTOR) 10 MG tablet Take 1 tablet (10 mg total) by mouth 2 (two) times a week. 45 tablet 3  . Tocilizumab (ACTEMRA [place]) Take one (1) injection into the skin once weekly.    . furosemide (LASIX) 20 MG tablet Take 1 tablet (20 mg total) by mouth daily. 90 tablet 3  . furosemide (LASIX) 20 MG tablet TAKE 1 TABLET (20 MG TOTAL) BY MOUTH  DAILY AS NEEDED. (Patient not taking: Reported on 04/29/2018) 90 tablet 2  . KLOR-CON 10 10 MEQ tablet TAKE 1 TABLET (10 MEQ TOTAL) BY MOUTH DAILY AS NEEDED (Patient not taking: Reported on 04/29/2018) 90 tablet 1  . losartan-hydrochlorothiazide (HYZAAR) 50-12.5 MG per tablet Take 1 tablet by mouth daily.    . potassium chloride (K-DUR) 10 MEQ tablet Take 1 tablet (10 mEq total) by mouth daily. 90 tablet 3  . predniSONE (DELTASONE) 10 MG tablet Take 10 mg by mouth daily.    . predniSONE (DELTASONE) 20 MG tablet TAKE TWO (2)  20 MG TABLETS (40 MG TOTAL)  BY MOUTH DAILY    . Vitamin D, Ergocalciferol, (DRISDOL) 50000 UNITS CAPS capsule Take 50,000 Units by mouth every 7 (seven) days.     No facility-administered medications prior to visit.      Allergies:   Iodine; Septra  [sulfamethoxazole-trimethoprim]; Sulfa antibiotics; and Watermelon [citrullus vulgaris]   Social History   Socioeconomic History  . Marital status: Married    Spouse name: Not on file  . Number of children: Not on file  . Years of education: Not on file  . Highest education level: Not on file  Occupational History  . Not on file  Social Needs  . Financial resource strain: Not on file  . Food insecurity:    Worry: Not on file    Inability: Not on file  . Transportation needs:    Medical: Not on file    Non-medical: Not on file  Tobacco Use  . Smoking status: Former Games developer  . Smokeless tobacco: Never Used  Substance and Sexual Activity  . Alcohol use: No  . Drug use: No  . Sexual activity: Not on file  Lifestyle  . Physical activity:    Days per week: Not on file    Minutes per session: Not on file  . Stress: Not on file  Relationships  . Social connections:    Talks on phone: Not on file    Gets together: Not on file    Attends religious service: Not on file    Active member of club or organization: Not on file    Attends meetings of clubs or organizations: Not on file    Relationship status: Not on file  Other Topics Concern  . Not on file  Social History Narrative  . Not on file     Family History:  The patient's family history includes Heart disease in her father and mother.   ROS:   Please see the history of present illness.    Depressed.  Losing her independence because of giant cell arteritis.  No longer able to drive.  Having tension with her husband. All other systems reviewed and are negative.   PHYSICAL EXAM:   VS:  BP 140/70 (BP Location: Left Arm, Patient Position: Sitting, Cuff Size: Normal)   Pulse 73   Ht 5\' 2"  (1.575 m)   Wt 209 lb 12.8 oz (95.2 kg)   SpO2 92%   BMI 38.37 kg/m    GEN: Morbidly obese, well developed, in no acute distress  HEENT: normal  Neck: no JVD, carotid bruits, or masses Cardiac: RRR; no murmurs, rubs, or gallops.   Right greater than left lower extremity edema. Respiratory:  clear to auscultation bilaterally, normal work of breathing GI: soft, nontender, nondistended, + BS MS: no deformity or atrophy  Skin: warm and dry, no rash Neuro:  Alert and Oriented x 3, Strength and sensation are intact  Psych: euthymic mood, full affect  Wt Readings from Last 3 Encounters:  04/29/18 209 lb 12.8 oz (95.2 kg)  10/18/17 203 lb 6.4 oz (92.3 kg)  09/13/17 201 lb 12.8 oz (91.5 kg)      Studies/Labs Reviewed:   EKG:  EKG  Not repeated  Recent Labs: 08/01/2017: TSH 0.203 02/17/2018: ALT 33; BUN 34; Creatinine, Ser 1.59; Hemoglobin 13.6; Platelets 153; Potassium 3.3; Sodium 138   Lipid Panel No results found for: CHOL, TRIG, HDL, CHOLHDL, VLDL, LDLCALC, LDLDIRECT  Additional studies/ records that were reviewed today include:  No recent vascular studies.    ASSESSMENT:    1. CAD in native artery   2. Giant cell arteritis (HCC)   3. Mixed hyperlipidemia   4. Essential hypertension      PLAN:  In order of problems listed above:  1. No anginal complaints.  Continue to monitor for ischemic symptoms.  Aerobic activity as much as possible by keeping safety in mind to avoid falls.  Continue to use the walker. 2. Continue steroid therapy as currently being given. 3. LDL target is 70 and when most recently checked 118.  On steroid therapy.  Will monitor and reevaluate once steroids discontinued. 4. Upper limit of blood pressure.  Target 130/80 mmHg.  Use furosemide either 20 or 40 mg/day depending upon lower extremity edema but also for blood pressure control.  Want to remain less than 140/90 mmHg.  Acknowledged limitations in her quality of life.  Pointed out a component of depression that she should speak to Dr. Evlyn Kanner about.  Notify us if angina.  Clinical follow-up 1 year.  Medication Adjustments/Labs and Tests Ordered: Current medicines are reviewed at length with the patient today.  Concerns regarding  medicines are outlined above.  Medication changes, Labs and Tests ordered today are listed in the Patient Instructions below. Patient Instructions  Medication Instructions:  Your physician recommends that you continue on your current medications as directed. Please refer to the Current Medication list given to you today.  Labwork: None  Testing/Procedures: None  Follow-Up: Your physician wants you to follow-up in: 1 year with Dr. Katrinka Blazing.  You will receive a reminder letter in the mail two months in advance. If you don't receive a letter, please call our office to schedule the follow-up appointment.   Any Other Special Instructions Will Be Listed Below (If Applicable).     If you need a refill on your cardiac medications before your next appointment, please call your pharmacy.      Signed, Lesleigh Noe, MD  04/29/2018 12:16 PM    [facility] [address] [place], [place], [place]  [address] Phone: [phone]; Fax: [phone]

## 2018-04-29 ENCOUNTER — Encounter: Payer: Self-pay | Admitting: Interventional Cardiology

## 2018-04-29 ENCOUNTER — Ambulatory Visit: Payer: Medicare Other | Admitting: Interventional Cardiology

## 2018-04-29 VITALS — BP 140/70 | HR 73 | Ht 62.0 in | Wt 209.8 lb

## 2018-04-29 DIAGNOSIS — I10 Essential (primary) hypertension: Secondary | ICD-10-CM | POA: Diagnosis not present

## 2018-04-29 DIAGNOSIS — M316 Other giant cell arteritis: Secondary | ICD-10-CM

## 2018-04-29 DIAGNOSIS — E782 Mixed hyperlipidemia: Secondary | ICD-10-CM

## 2018-04-29 DIAGNOSIS — I2510 Atherosclerotic heart disease of native coronary artery without angina pectoris: Secondary | ICD-10-CM

## 2018-04-29 NOTE — Patient Instructions (Signed)

## 2018-07-10 ENCOUNTER — Other Ambulatory Visit (HOSPITAL_COMMUNITY): Payer: Self-pay | Admitting: Endocrinology

## 2018-07-10 DIAGNOSIS — R609 Edema, unspecified: Secondary | ICD-10-CM

## 2018-07-23 ENCOUNTER — Ambulatory Visit (HOSPITAL_COMMUNITY)
Admission: RE | Admit: 2018-07-23 | Discharge: 2018-07-23 | Disposition: A | Discharge status: Home / Self Care | Payer: Medicare Other | Source: Ambulatory Visit | Attending: Endocrinology | Admitting: Endocrinology

## 2018-07-23 ENCOUNTER — Other Ambulatory Visit (HOSPITAL_COMMUNITY): Payer: Medicare Other

## 2018-07-23 DIAGNOSIS — I509 Heart failure, unspecified: Secondary | ICD-10-CM | POA: Diagnosis not present

## 2018-07-23 DIAGNOSIS — I2510 Atherosclerotic heart disease of native coronary artery without angina pectoris: Secondary | ICD-10-CM | POA: Insufficient documentation

## 2018-07-23 DIAGNOSIS — I340 Nonrheumatic mitral (valve) insufficiency: Secondary | ICD-10-CM | POA: Insufficient documentation

## 2018-07-23 DIAGNOSIS — R609 Edema, unspecified: Secondary | ICD-10-CM | POA: Diagnosis present

## 2018-07-23 NOTE — Progress Notes (Signed)
  Echocardiogram 2D Echocardiogram has been performed.  Jody Shaw 07/23/2018, 3:46 PM

## 2018-09-09 ENCOUNTER — Telehealth: Payer: Self-pay | Admitting: Cardiology

## 2018-09-09 NOTE — Telephone Encounter (Signed)
Returned call to Pt.  Per Pt Jody Shaw started her on furosemide leg swelling and she doesn't think it is working and wondering if she should stop.  Pt has more recently seen Dr. Katrinka Blazing.  Advised per review of Dr. Michaelle Copas note she can take up to 40 mg daily of the furosemide for swelling.  Per Pt she has only been taking 20 mg.  Advised Pt to increase to 40 mg furosemide daily and when she takes 40 mg of furosemide to increase her potassium to 20 meq with the increased dose of lasix.  Advised if fluid level is not better in a few weeks call us back and we can reevaluate.  Pt indicates understanding.  Dr. Katrinka Blazing updated.

## 2018-09-09 NOTE — Telephone Encounter (Signed)
New message   Pt c/o medication issue:  1. Name of Medication:  furosemide (LASIX) 20 MG tablet Take 20 mg by mouth daily as needed (swelling).    2. How are you currently taking this medication (dosage and times per day)? Once a day   3. Are you having a reaction (difficulty breathing--STAT)? No   4. What is your medication issue? Doesn't think its working    Pt c/o swelling: STAT is pt has developed SOB within 24 hours  1) How much weight have you gained and in what time span?  Not sure she has been on steroids for awhile   2) If swelling, where is the swelling located?  Both legs   3) Are you currently taking a fluid pill? yes  4) Are you currently SOB? yes  5) Do you have a log of your daily weights (if so, list)? no  6) Have you gained 3 pounds in a day or 5 pounds in a week? Not sure   7) Have you traveled recently? no

## 2018-09-17 ENCOUNTER — Encounter: Payer: Self-pay | Admitting: Physician Assistant

## 2018-09-17 NOTE — Telephone Encounter (Signed)
Spoke with pt and she states after increasing Furosemide her swelling has not improved.  Had slight improvement one night after elevation but returned as soon as she got up.  Denies other sx.  Denies increased salt in diet.  Scheduled pt to see Tereso Newcomer, PA-C tomorrow.  Pt appreciative for call.

## 2018-09-17 NOTE — Telephone Encounter (Signed)
  Patient stated she was asked to call back in a week to see if the swelling was any better. She states the swelling is not any better and she still can't get her shoes on. She is wondering if it is the steroids she has been on for the last year. Please advise

## 2018-09-18 ENCOUNTER — Ambulatory Visit: Payer: Medicare Other | Admitting: Physician Assistant

## 2018-09-18 ENCOUNTER — Encounter: Payer: Self-pay | Admitting: Physician Assistant

## 2018-09-18 VITALS — BP 126/62 | HR 67 | Ht 62.0 in | Wt 223.0 lb

## 2018-09-18 DIAGNOSIS — M7989 Other specified soft tissue disorders: Secondary | ICD-10-CM | POA: Diagnosis not present

## 2018-09-18 DIAGNOSIS — I2510 Atherosclerotic heart disease of native coronary artery without angina pectoris: Secondary | ICD-10-CM

## 2018-09-18 DIAGNOSIS — R0602 Shortness of breath: Secondary | ICD-10-CM

## 2018-09-18 DIAGNOSIS — R5383 Other fatigue: Secondary | ICD-10-CM

## 2018-09-18 DIAGNOSIS — I10 Essential (primary) hypertension: Secondary | ICD-10-CM

## 2018-09-18 MED ORDER — TORSEMIDE 20 MG PO TABS: 20.0000 mg | ORAL_TABLET | Freq: Every day | ORAL | 3 refills | Status: DC

## 2018-09-18 MED ORDER — POTASSIUM CHLORIDE ER 10 MEQ PO TBCR: 30.0000 meq | EXTENDED_RELEASE_TABLET | Freq: Every day | ORAL | 3 refills | Status: DC

## 2018-09-18 NOTE — Patient Instructions (Signed)
Medication Instructions:  Your physician has recommended you make the following change in your medication: 1. STOP TAKING FUROSEMIDE  2. START TAKING TORSEMIDE 20 MG DAILY.  3. INCREASE POTASSIUM 10 MEQ TO 30 MEQ DAILY.  If you need a refill on your cardiac medications before your next appointment, please call your pharmacy.   Lab work: TODAY: CBC, TSH, CMET LABS TO BE DONE IN 1 WEEK: BMET If you have labs (blood work) drawn today and your tests are completely normal, you will receive your results only by: Marland Kitchen MyChart Message (if you have MyChart) OR . A paper copy in the mail If you have any lab test that is abnormal or we need to change your treatment, we will call you to review the results.  Testing/Procedures: NONE  Follow-Up: At [facility], you and your health needs are our priority.  As part of our continuing mission to provide you with exceptional heart care, we have created designated Provider Care Teams.  These Care Teams include your primary Cardiologist (physician) and Advanced Practice Providers (APPs -  Physician Assistants and Nurse Practitioners) who all work together to provide you with the care you need, when you need it. You will need a follow up appointment in 3-4 weeks.  You may see Lesleigh Noe, MD or Tereso Newcomer, PA-C.  Any Other Special Instructions Will Be Listed Below (If Applicable).

## 2018-09-18 NOTE — Progress Notes (Signed)
Cardiology Office Note:    Date:  09/18/2018   ID:  Jody Shaw, DOB 07/30/1941, MRN [id number]  PCP:  Reynold Bowen, MD  Cardiologist:  Sinclair Grooms, MD   Electrophysiologist:  None   Referring MD: Reynold Bowen, MD   Chief Complaint  Patient presents with  . Leg Swelling     History of Present Illness:    Jody Shaw is a 77 y.o. female with coronary artery disease status post remote rotational atherectomy to the LAD (1996), giant cell arteritis with left eye blindness, hypertension, hyperlipidemia.  She was last seen by Dr.Smith June 2019.  Echocardiogram in September 2019 demonstrated normal LV function with mild diastolic dysfunction.  Jody Shaw returns for evaluation of leg swelling.  Her legs have been swollen for months.  She has increased her Furosemide without much improvement.  Leg elevation does not seem to help.  She does feel short of breath with most activities.  She denies orthopnea, paroxysmal nocturnal dyspnea.  She has not had any chest pain.  She denies syncope.  She has not been as active since she developed giant cell arteritis.  She does feel fatigued.    Prior CV studies:   The following studies were reviewed today:  Echo 07/23/2018 EF 55-60, normal wall motion, grade 1 diastolic dysfunction, MAC  LE venous duplex 09/14/2017 Final Interpretation Left There is no evidence of deep vein thrombosis in the lower extremity. No evidence of superficial venous thrombosis . No evidence of a Baker's cyst  Nuclear stress test 07/30/2012 No ischemia  Past Medical History:  Diagnosis Date  . Coronary artery disease    s/p rotational atherectomy to the LAD in 1996 // Nuc 9/13: no ischemia  . Echocardiogram    Echo 9/19:  EF 55-60, normal wall motion, grade 1 diastolic dysfunction, MAC  . GERD (gastroesophageal reflux disease)   . Giant cell arteritis ([place])   . HLD (hyperlipidemia)   . Hypothyroidism   . Vision loss of left eye    Surgical  Hx: The patient  has a past surgical history that includes Artery Biopsy (Left, 08/03/2017).   Current Medications: Current Meds  Medication Sig  . aspirin 81 MG tablet Take 81 mg by mouth daily.   Marland Kitchen atenolol (TENORMIN) 25 MG tablet TAKE 1 TABLET BY MOUTH EVERY DAY  . b complex vitamins capsule Take 1 capsule by mouth daily.  . isosorbide mononitrate (IMDUR) 60 MG 24 hr tablet Take 1 tablet (60 mg total) by mouth daily. Must keep appt for future reffills  . lansoprazole (PREVACID) 30 MG capsule Take 30 mg by mouth daily as needed (HEARTBURN).   Marland Kitchen levothyroxine (SYNTHROID, LEVOTHROID) 88 MCG tablet Take 88 mcg by mouth daily before breakfast.  . losartan (COZAAR) 50 MG tablet Take 50 mg by mouth daily.  . Multiple Vitamin (MULTI VITAMIN DAILY) TABS Take 2 tablets by mouth daily.   . nitroGLYCERIN (NITROSTAT) 0.4 MG SL tablet Place 0.4 mg under the tongue every 5 (five) minutes as needed for chest pain.  Marland Kitchen PREDNISONE, PAK, PO Take 1 mg by mouth as directed.  . rosuvastatin (CRESTOR) 10 MG tablet Take 1 tablet (10 mg total) by mouth 2 (two) times a week.  . Tocilizumab (ACTEMRA Loop) Take one (1) injection into the skin once weekly.  . [DISCONTINUED] furosemide (LASIX) 20 MG tablet Take 20 mg by mouth daily as needed (swelling).  . [DISCONTINUED] potassium chloride (K-DUR) 10 MEQ tablet Take 10 mEq by mouth  daily as needed (Take with Lasix for swelling.).     Allergies:   Iodine; Septra [sulfamethoxazole-trimethoprim]; Sulfa antibiotics; and Watermelon [citrullus vulgaris]   Social History   Tobacco Use  . Smoking status: Former Research scientist (life sciences)  . Smokeless tobacco: Never Used  Substance Use Topics  . Alcohol use: No  . Drug use: No     Family Hx: The patient's family history includes Heart disease in her father and mother.  ROS:   Please see the history of present illness.    Review of Systems  HENT: Positive for hearing loss.   Cardiovascular: Positive for leg swelling.  Respiratory:  Positive for cough.   Musculoskeletal: Positive for joint pain.  Genitourinary: Positive for incomplete emptying.   All other systems reviewed and are negative.   EKGs/Labs/Other Test Reviewed:    EKG:  EKG is   ordered today.  The ekg ordered today demonstrates normal sinus rhythm, HR 67, normal axis, inc RBBB, QTc 420 ms, similar to old EKGs.   Recent Labs: 02/17/2018: ALT 33; BUN 34; Creatinine, Ser 1.59; Hemoglobin 13.6; Platelets 153; Potassium 3.3; Sodium 138   Recent Lipid Panel No results found for: CHOL, TRIG, HDL, CHOLHDL, LDLCALC, LDLDIRECT  Physical Exam:    VS:  BP 126/62   Pulse 67   Ht _0  (1.575 m)   Wt 223 lb (101.2 kg)   BMI 40.79 kg/m     Wt Readings from Last 3 Encounters:  09/18/18 223 lb (101.2 kg)  04/29/18 209 lb 12.8 oz (95.2 kg)  10/18/17 203 lb 6.4 oz (92.3 kg)     Physical Exam  Constitutional: She is oriented to person, place, and time. She appears well-developed and well-nourished. No distress.  HENT:  Head: Normocephalic and atraumatic.  Eyes: No scleral icterus.  Neck: No thyromegaly present.  Cardiovascular: Normal rate and regular rhythm.  No murmur heard. Pulmonary/Chest: Effort normal. She has no rales.  Abdominal: Soft.  Musculoskeletal: She exhibits edema (2-3+ bilat LE edema).  Lymphadenopathy:    She has no cervical adenopathy.  Neurological: She is alert and oriented to person, place, and time.  Skin: Skin is warm and dry.  Psychiatric: She has a normal mood and affect.    ASSESSMENT & PLAN:    Leg swelling Her leg swelling is likely multifactorial and related to inactivity, venous insufficiency as well as diastolic dysfunction.  Her albumin has been low in the past.  She has also been on high-dose steroids for several months since her diagnosis with giant cell arteritis.  She has not had much benefit with furosemide.  We discussed changing her furosemide to torsemide to see if she has any better response.  At this point, I  do not think she will be able to get compression stockings on.  -DC furosemide  -Start Torsemide 20 mg daily   -Increase potassium to 30 mEq once daily  -Obtain TSH, CBC, CMET  -Keep legs elevated  -Follow-up in 3-4 weeks  Shortness of breath  I suspect her shortness of breath is related to inactivity.  However, she does have a history of coronary disease.  Her ECG does not demonstrate any significant change.  She has not had any chest pain.  Adjust diuretics as noted.  Obtain labs today as noted.  If she continues to complain of shortness of breath or she has worsening shortness of breath, consider stress testing.  Coronary artery disease involving native coronary artery of native heart without angina pectoris History of remote  angioplasty to the LAD in 1996.  She has had some shortness of breath with exertion.  We will see how she responds to adjustments in her diuretic.  Consider stress testing if her shortness of breath continues.  Continue aspirin, beta-blocker, statin therapy.  Essential hypertension  The patient's blood pressure is controlled on her current regimen.  Continue current therapy.   Other fatigue  Fatigue is likely multifactorial.  Obtain TSH, CBC today.   Dispo:  Return in about 4 weeks (around 10/16/2018) for Routine Follow Up w/ Dr. Tamala Julian, or Richardson Dopp, PA-C.   Medication Adjustments/Labs and Tests Ordered: Current medicines are reviewed at length with the patient today.  Concerns regarding medicines are outlined above.  Tests Ordered: Orders Placed This Encounter  Procedures  . CBC  . TSH  . Comprehensive metabolic panel  . Basic metabolic panel  . EKG 12-Lead   Medication Changes: Meds ordered this encounter  Medications  . torsemide (DEMADEX) 20 MG tablet    Sig: Take 1 tablet (20 mg total) by mouth daily.    Dispense:  90 tablet    Refill:  3  . potassium chloride (K-DUR) 10 MEQ tablet    Sig: Take 3 tablets (30 mEq total) by mouth daily.     Dispense:  270 tablet    Refill:  3    Signed, Richardson Dopp, PA-C  09/18/2018 2:53 PM    [facility] [address], [place], [place]  [address] Phone: [phone]; Fax: [phone]

## 2018-09-19 LAB — CBC
HEMATOCRIT: 36.1 % (ref 34.0–46.6)
Hemoglobin: 11.6 g/dL (ref 11.1–15.9)
MCH: 28.6 pg (ref 26.6–33.0)
MCHC: 32.1 g/dL (ref 31.5–35.7)
MCV: 89 fL (ref 79–97)
Platelets: 296 10*3/uL (ref 150–450)
RBC: 4.06 x10E6/uL (ref 3.77–5.28)
RDW: 14.5 % (ref 12.3–15.4)
WBC: 7.4 10*3/uL (ref 3.4–10.8)

## 2018-09-19 LAB — COMPREHENSIVE METABOLIC PANEL
ALBUMIN: 4 g/dL (ref 3.5–4.8)
ALT: 15 IU/L (ref 0–32)
AST: 12 IU/L (ref 0–40)
Albumin/Globulin Ratio: 1.1 — ABNORMAL LOW (ref 1.2–2.2)
Alkaline Phosphatase: 77 IU/L (ref 39–117)
BILIRUBIN TOTAL: 0.4 mg/dL (ref 0.0–1.2)
BUN / CREAT RATIO: 15 (ref 12–28)
BUN: 15 mg/dL (ref 8–27)
CO2: 24 mmol/L (ref 20–29)
CREATININE: 1.02 mg/dL — AB (ref 0.57–1.00)
Calcium: 9.9 mg/dL (ref 8.7–10.3)
Chloride: 101 mmol/L (ref 96–106)
GFR calc Af Amer: 60 mL/min/{1.73_m2} (ref >59)
GFR calc non Af Amer: 52 mL/min/{1.73_m2} — ABNORMAL LOW (ref >59)
GLOBULIN, TOTAL: 3.6 g/dL (ref 1.5–4.5)
Glucose: 113 mg/dL — ABNORMAL HIGH (ref 65–99)
Potassium: 4.1 mmol/L (ref 3.5–5.2)
SODIUM: 141 mmol/L (ref 134–144)
Total Protein: 7.6 g/dL (ref 6.0–8.5)

## 2018-09-19 LAB — TSH: TSH: 0.807 u[IU]/mL (ref 0.450–4.500)

## 2018-09-25 ENCOUNTER — Other Ambulatory Visit: Payer: Medicare Other | Admitting: *Deleted

## 2018-09-26 LAB — BASIC METABOLIC PANEL
BUN/Creatinine Ratio: 16 (ref 12–28)
BUN: 16 mg/dL (ref 8–27)
CALCIUM: 9.3 mg/dL (ref 8.7–10.3)
CHLORIDE: 101 mmol/L (ref 96–106)
CO2: 23 mmol/L (ref 20–29)
Creatinine, Ser: 1.03 mg/dL — ABNORMAL HIGH (ref 0.57–1.00)
GFR calc Af Amer: 60 mL/min/{1.73_m2} (ref >59)
GFR, EST NON AFRICAN AMERICAN: 52 mL/min/{1.73_m2} — AB (ref >59)
Glucose: 103 mg/dL — ABNORMAL HIGH (ref 65–99)
POTASSIUM: 3.7 mmol/L (ref 3.5–5.2)
Sodium: 140 mmol/L (ref 134–144)

## 2018-10-06 ENCOUNTER — Other Ambulatory Visit: Payer: Self-pay | Admitting: Cardiology

## 2018-10-15 ENCOUNTER — Ambulatory Visit: Payer: Medicare Other | Admitting: Physician Assistant

## 2018-11-24 ENCOUNTER — Encounter: Payer: Self-pay | Admitting: Interventional Cardiology

## 2019-01-03 ENCOUNTER — Other Ambulatory Visit: Payer: Self-pay | Admitting: Cardiology

## 2019-01-28 ENCOUNTER — Other Ambulatory Visit: Payer: Self-pay

## 2019-01-28 MED ORDER — ISOSORBIDE MONONITRATE ER 60 MG PO TB24: 60.0000 mg | ORAL_TABLET | Freq: Every day | ORAL | 7 refills | Status: DC

## 2019-01-29 ENCOUNTER — Other Ambulatory Visit: Payer: Self-pay | Admitting: Cardiology

## 2019-02-22 ENCOUNTER — Other Ambulatory Visit: Payer: Self-pay | Admitting: Interventional Cardiology

## 2019-05-08 ENCOUNTER — Telehealth: Payer: Self-pay | Admitting: Interventional Cardiology

## 2019-05-08 NOTE — Progress Notes (Deleted)
Cardiology Office Note:    Date:  05/08/2019   ID:  CAMESHA FAROOQ, DOB 1941/07/14, MRN [id number]  PCP:  Reynold Bowen, MD  Cardiologist:  Sinclair Grooms, MD   Referring MD: Reynold Bowen, MD   No chief complaint on file.   History of Present Illness:    Jody Shaw is a 78 y.o. female with a hx of  HTN, HLD, and CAD with prior LAD rotational atherectomy remote. Last visit 08/11/16 and was stable. 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  ***  Past Medical History:  Diagnosis Date  . Coronary artery disease    s/p rotational atherectomy to the LAD in 1996 // Nuc 9/13: no ischemia  . Echocardiogram    Echo 9/19:  EF 55-60, normal wall motion, grade 1 diastolic dysfunction, MAC  . GERD (gastroesophageal reflux disease)   . Giant cell arteritis ([place])   . HLD (hyperlipidemia)   . Hypothyroidism   . Vision loss of left eye     Past Surgical History:  Procedure Laterality Date  . ARTERY BIOPSY Left 08/03/2017   Procedure: BIOPSY TEMPORAL ARTERY;  Surgeon: Kieth Brightly Arta Bruce, MD;  Location: [facility];  Service: General;  Laterality: Left;    Current Medications: No outpatient medications have been marked as taking for the 05/09/19 encounter (Appointment) with Belva Crome, MD.     Allergies:   Iodine, Septra [sulfamethoxazole-trimethoprim], Sulfa antibiotics, and Watermelon [citrullus vulgaris]   Social History   Socioeconomic History  . Marital status: Married    Spouse name: Not on file  . Number of children: Not on file  . Years of education: Not on file  . Highest education level: Not on file  Occupational History  . Not on file  Social Needs  . Financial resource strain: Not on file  . Food insecurity    Worry: Not on file    Inability: Not on file  . Transportation needs    Medical: Not on file    Non-medical: Not on file  Tobacco Use  . Smoking status: Former Research scientist (life sciences)  . Smokeless tobacco: Never Used  Substance and  Sexual Activity  . Alcohol use: No  . Drug use: No  . Sexual activity: Not on file  Lifestyle  . Physical activity    Days per week: Not on file    Minutes per session: Not on file  . Stress: Not on file  Relationships  . Social Herbalist on phone: Not on file    Gets together: Not on file    Attends religious service: Not on file    Active member of club or organization: Not on file    Attends meetings of clubs or organizations: Not on file    Relationship status: Not on file  Other Topics Concern  . Not on file  Social History Narrative  . Not on file     Family History: The patient's family history includes Heart disease in her father and mother.  ROS:   Please see the history of present illness.    *** All other systems reviewed and are negative.  EKGs/Labs/Other Studies Reviewed:    The following studies were reviewed today: ***  EKG:  EKG ***  Recent Labs: 09/18/2018: ALT 15; Hemoglobin 11.6; Platelets 296; TSH 0.807 09/25/2018: BUN 16; Creatinine, Ser 1.03; Potassium 3.7; Sodium 140  Recent Lipid Panel No results found for: CHOL, TRIG, HDL, CHOLHDL, VLDL, LDLCALC, LDLDIRECT  Physical Exam:    VS:  There were no vitals taken for this visit.    Wt Readings from Last 3 Encounters:  09/18/18 223 lb (101.2 kg)  04/29/18 209 lb 12.8 oz (95.2 kg)  10/18/17 203 lb 6.4 oz (92.3 kg)     GEN: ***. No acute distress HEENT: Normal NECK: No JVD. LYMPHATICS: No lymphadenopathy CARDIAC: ***RRR.  *** murmur, ***gallop, ***edema VASCULAR: *** Pulses, *** bruits RESPIRATORY:  Clear to auscultation without rales, wheezing or rhonchi  ABDOMEN: Soft, non-tender, non-distended, No pulsatile mass, MUSCULOSKELETAL: No deformity  SKIN: Warm and dry NEUROLOGIC:  Alert and oriented x 3 PSYCHIATRIC:  Normal affect   ASSESSMENT:    1. Coronary artery disease involving native coronary artery of native heart without angina pectoris   2. Essential hypertension    3. Giant cell arteritis ([place])   4. Mixed hyperlipidemia   5. Renal insufficiency   6. Educated About Covid-19 Virus Infection    PLAN:    In order of problems listed above:  1. ***   Medication Adjustments/Labs and Tests Ordered: Current medicines are reviewed at length with the patient today.  Concerns regarding medicines are outlined above.  No orders of the defined types were placed in this encounter.  No orders of the defined types were placed in this encounter.   There are no Patient Instructions on file for this visit.   Signed, Sinclair Grooms, MD  05/08/2019 6:27 PM    [place]

## 2019-05-08 NOTE — Telephone Encounter (Signed)
New Message ° ° ° ° °CONSENT FOR TELE-HEALTH VISIT - PLEASE REVIEW TO PATIENT ° °I hereby voluntarily request, consent and authorize [facility] and its employed or contracted physicians, physician assistants, nurse practitioners or other licensed health care professionals (the Practitioner), to provide me with telemedicine health care services (the "Services") as deemed necessary by the treating Practitioner. I acknowledge and consent to receive the Services by the Practitioner via telemedicine. I understand that the telemedicine visit will involve communicating with the Practitioner through live audiovisual communication technology and the disclosure of certain medical information by electronic transmission. I acknowledge that I have been given the opportunity to request an in-person assessment or other available alternative prior to the telemedicine visit and am voluntarily participating in the telemedicine visit. ° °I understand that I have the right to withhold or withdraw my consent to the use of telemedicine in the course of my care at any time, without affecting my right to future care or treatment, and that the Practitioner or I may terminate the ° °telemedicine visit at any time. I understand that I have the right to inspect all information obtained and/or recorded in the course of the telemedicine visit and may receive copies of available information for a reasonable fee. I understand that some of the potential risks of receiving the Services via telemedicine include: ° °Delay or interruption in medical evaluation due to technological equipment failure or disruption; ° °Information transmitted may not be sufficient (e.g. poor resolution of images) to allow for appropriate medical decision making by the Practitioner; and/or ° °In rare instances, security protocols could fail, causing a breach of personal health information. ° °Furthermore, I acknowledge that it is my responsibility to provide  information about my medical history, conditions and care that is complete and accurate to the best of my ability. I acknowledge that Practitioner's advice, recommendations, and/or decision may be based on factors not within their control, such as incomplete or inaccurate data provided by me or distortions of diagnostic images or specimens that may result from electronic transmissions. I understand that the practice of medicine is not an exact science and that Practitioner makes no warranties or guarantees regarding treatment outcomes. I acknowledge that I will receive a copy of this consent concurrently upon execution via email to the email address I last provided but may also request a printed copy by calling the office of [facility]. ° °I understand that my insurance will be billed for this visit. ° °I have read or had this consent read to me. ° °I understand the contents of this consent, which adequately explains the benefits and risks of the Services being provided via telemedicine. ° °I have been provided ample opportunity to ask questions regarding this consent and the Services and have had my questions answered to my satisfaction. ° °I give my informed consent for the services to be provided through the use of telemedicine in my medical care ° °By participating in this telemedicine visit I agree to the above °YES °

## 2019-05-09 ENCOUNTER — Other Ambulatory Visit: Payer: Self-pay

## 2019-05-09 ENCOUNTER — Encounter: Payer: Self-pay | Admitting: Interventional Cardiology

## 2019-05-09 ENCOUNTER — Telehealth (INDEPENDENT_AMBULATORY_CARE_PROVIDER_SITE_OTHER): Payer: Medicare Other | Admitting: Interventional Cardiology

## 2019-05-09 VITALS — BP 168/88 | Ht 62.0 in | Wt 205.0 lb

## 2019-05-09 DIAGNOSIS — N289 Disorder of kidney and ureter, unspecified: Secondary | ICD-10-CM

## 2019-05-09 DIAGNOSIS — I2510 Atherosclerotic heart disease of native coronary artery without angina pectoris: Secondary | ICD-10-CM | POA: Diagnosis not present

## 2019-05-09 DIAGNOSIS — M316 Other giant cell arteritis: Secondary | ICD-10-CM

## 2019-05-09 DIAGNOSIS — E782 Mixed hyperlipidemia: Secondary | ICD-10-CM

## 2019-05-09 DIAGNOSIS — Z7189 Other specified counseling: Secondary | ICD-10-CM

## 2019-05-09 DIAGNOSIS — I10 Essential (primary) hypertension: Secondary | ICD-10-CM

## 2019-05-09 MED ORDER — POTASSIUM CHLORIDE CRYS ER 20 MEQ PO TBCR: 20.0000 meq | EXTENDED_RELEASE_TABLET | Freq: Every day | ORAL | 1 refills | Status: DC

## 2019-05-09 MED ORDER — TORSEMIDE 20 MG PO TABS: 20.0000 mg | ORAL_TABLET | Freq: Every day | ORAL | 1 refills | Status: DC

## 2019-05-09 NOTE — Patient Instructions (Addendum)
Medication Instructions:  START TAKING TORSEMIDE 20 MG ONCE DAILY  START TAKING POTASSIUM CHLORIDE 20 mEq BY MOUTH DAILY  If you need a refill on your cardiac medications before your next appointment, please call your pharmacy.    Lab work:  IS SCHEDULED FOR Monday May 19, 2019 AT 3:15 PM AT OUR OFFICE-WE WILL CHECK A BMET.  If you have labs (blood work) drawn today and your tests are completely normal, you will receive your results only by: Marland Kitchen MyChart Message (if you have MyChart) OR . A paper copy in the mail If you have any lab test that is abnormal or we need to change your treatment, we will call you to review the results.    Follow-Up:  A FOLLOW-UP TELEPHONE VIRTUAL VISIT WITH DR Tamala Julian ON Friday June 27, 2019 AT 9:20 AM.

## 2019-05-09 NOTE — Progress Notes (Signed)
Virtual Visit via Telephone Note   This visit type was conducted due to national recommendations for restrictions regarding the COVID-19 Pandemic (e.g. social distancing) in an effort to limit this patient's exposure and mitigate transmission in our community.  Due to her co-morbid illnesses, this patient is at least at moderate risk for complications without adequate follow up.  This format is felt to be most appropriate for this patient at this time.  The patient did not have access to video technology/had technical difficulties with video requiring transitioning to audio format only (telephone).  All issues noted in this document were discussed and addressed.  No physical exam could be performed with this format.  Please refer to the patient's chart for her  consent to telehealth for [name].   Date:  05/09/2019   ID:  Jody Shaw, DOB [birth year], MRN [id number]  Patient Location: Home Provider Location: Office  PCP:  Reynold Bowen, MD  Cardiologist:  Sinclair Grooms, MD  Electrophysiologist:  None   Evaluation Performed:  Follow-Up Visit  Chief Complaint:  CAD  History of Present Illness:    Jody Shaw is a 78 y.o. female with coronary artery disease status post remote rotational atherectomy to the LAD (1996), giant cell arteritis with left eye blindness, hypertension, hyperlipidemia.  She was last seen by Dr.Anai Lipson June 2019.  Echocardiogram in September 2019 demonstrated normal LV function with mild diastolic dysfunction.  Heart is okay. Now on Methtrexate. LE swelling is persistent and did not seem to improve with torsemide last November.  She finally stopped taking the medication.  She is unable to put shoes on her feet.  She denies chest pain.  No orthopnea or PND.  No skin breakdown or lesions on her legs.  The patient does not have symptoms concerning for COVID-19 infection (fever, chills, cough, or new shortness of breath).    Past Medical History:   Diagnosis Date  . Coronary artery disease    s/p rotational atherectomy to the LAD in 1996 // Nuc 9/13: no ischemia  . Echocardiogram    Echo 9/19:  EF 55-60, normal wall motion, grade 1 diastolic dysfunction, MAC  . GERD (gastroesophageal reflux disease)   . Giant cell arteritis ([place])   . HLD (hyperlipidemia)   . Hypothyroidism   . Vision loss of left eye    Past Surgical History:  Procedure Laterality Date  . ARTERY BIOPSY Left 08/03/2017   Procedure: BIOPSY TEMPORAL ARTERY;  Surgeon: Kieth Brightly Arta Bruce, MD;  Location: [facility];  Service: General;  Laterality: Left;     Current Meds  Medication Sig  . aspirin 81 MG tablet Take 81 mg by mouth daily.   Marland Kitchen atenolol (TENORMIN) 25 MG tablet TAKE 1 TABLET BY MOUTH EVERY DAY  . b complex vitamins capsule Take 1 capsule by mouth daily.  . folic acid (FOLVITE) 1 MG tablet Take 1 mg by mouth daily.  . isosorbide mononitrate (IMDUR) 60 MG 24 hr tablet Take 1 tablet (60 mg total) by mouth daily.  . lansoprazole (PREVACID) 30 MG capsule Take 30 mg by mouth daily as needed (HEARTBURN).   Marland Kitchen levothyroxine (SYNTHROID, LEVOTHROID) 88 MCG tablet Take 88 mcg by mouth daily before breakfast.  . losartan (COZAAR) 50 MG tablet Take 50 mg by mouth daily.  . methotrexate (RHEUMATREX) 2.5 MG tablet Take 10 mg by mouth once a week. Caution:Chemotherapy. Protect from light. Take 4 tabs every friday  . Multiple Vitamin (MULTI VITAMIN DAILY) TABS Take  2 tablets by mouth daily.   . nitroGLYCERIN (NITROSTAT) 0.4 MG SL tablet Place 0.4 mg under the tongue every 5 (five) minutes as needed for chest pain.  . potassium chloride (K-DUR) 10 MEQ tablet Take 3 tablets (30 mEq total) by mouth daily.  Marland Kitchen PREDNISONE, PAK, PO Take 1 mg by mouth as directed.  . rosuvastatin (CRESTOR) 10 MG tablet TAKE 1 TABLET (10 MG TOTAL) BY MOUTH 2 (TWO) TIMES A WEEK.  . Tocilizumab (ACTEMRA [place]) Take one (1) injection into the skin once weekly.  . torsemide (DEMADEX) 20 MG tablet Take 1  tablet (20 mg total) by mouth daily.     Allergies:   Iodine, Septra [sulfamethoxazole-trimethoprim], Sulfa antibiotics, and Watermelon [citrullus vulgaris]   Social History   Tobacco Use  . Smoking status: Former Research scientist (life sciences)  . Smokeless tobacco: Never Used  Substance Use Topics  . Alcohol use: No  . Drug use: No     Family Hx: The patient's family history includes Heart disease in her father and mother.  ROS:   Please see the history of present illness.    She is having a difficult time controlling sodium in her diet. All other systems reviewed and are negative.   Prior CV studies:   The following studies were reviewed today:  No recent CV testing  Labs/Other Tests and Data Reviewed:    EKG:  No ECG reviewed.  Recent Labs: 09/18/2018: ALT 15; Hemoglobin 11.6; Platelets 296; TSH 0.807 09/25/2018: BUN 16; Creatinine, Ser 1.03; Potassium 3.7; Sodium 140   Recent Lipid Panel No results found for: CHOL, TRIG, HDL, CHOLHDL, LDLCALC, LDLDIRECT  Wt Readings from Last 3 Encounters:  05/09/19 205 lb (93 kg)  09/18/18 223 lb (101.2 kg)  04/29/18 209 lb 12.8 oz (95.2 kg)     Objective:    Vital Signs:  BP (!) 168/88   Ht _0  (1.575 m)   Wt 205 lb (93 kg)   BMI 37.49 kg/m    VITAL SIGNS:  reviewed  ASSESSMENT & PLAN:    1. Coronary artery disease involving native coronary artery of native heart without angina pectoris   2. Essential hypertension   3. Giant cell arteritis ([place])   4. Mixed hyperlipidemia   5. Renal insufficiency   6. Educated About Covid-19 Virus Infection    PLAN: 1. Brief conversation concerning secondary prevention 2. Blood pressure is too high.  Likely impact from medications being used for giant cell arteritis including prednisone.  Resume diuretic therapy, torsemide 20 mg daily and Klor-Con 20 mEq/day.  Basic metabolic panel in 1 week. 3. Not addressed.  Has caused blindness in her left eye. 4. Target LDL less than 70 5. Not addressed.   Basic metabolic panel in 1 week. 6. Social distancing, masking, and handwashing.  Overall education and awareness concerning primary/secondary risk prevention was discussed in detail: LDL less than 70, hemoglobin A1c less than 7, blood pressure target less than 130/80 mmHg, >150 minutes of moderate aerobic activity per week, avoidance of smoking, weight control (via diet and exercise), and continued surveillance/management of/for obstructive sleep apnea.   COVID-19 Education: The signs and symptoms of COVID-19 were discussed with the patient and how to seek care for testing (follow up with PCP or arrange E-visit).  The importance of social distancing was discussed today.  Time:   Today, I have spent 15 minutes with the patient with telehealth technology discussing the above problems.     Medication Adjustments/Labs and Tests Ordered: Current medicines  are reviewed at length with the patient today.  Concerns regarding medicines are outlined above.   Tests Ordered: No orders of the defined types were placed in this encounter.   Medication Changes: No orders of the defined types were placed in this encounter.   Follow Up:  Virtual Visit in 6 week(s)  Signed, Sinclair Grooms, MD  05/09/2019 10:26 AM    [place]

## 2019-05-19 ENCOUNTER — Other Ambulatory Visit: Payer: Self-pay

## 2019-05-19 ENCOUNTER — Other Ambulatory Visit: Payer: Medicare Other | Admitting: *Deleted

## 2019-05-19 DIAGNOSIS — N289 Disorder of kidney and ureter, unspecified: Secondary | ICD-10-CM

## 2019-05-19 DIAGNOSIS — M316 Other giant cell arteritis: Secondary | ICD-10-CM

## 2019-05-19 DIAGNOSIS — E782 Mixed hyperlipidemia: Secondary | ICD-10-CM

## 2019-05-19 DIAGNOSIS — Z7189 Other specified counseling: Secondary | ICD-10-CM

## 2019-05-19 DIAGNOSIS — I2510 Atherosclerotic heart disease of native coronary artery without angina pectoris: Secondary | ICD-10-CM

## 2019-05-19 DIAGNOSIS — I10 Essential (primary) hypertension: Secondary | ICD-10-CM

## 2019-05-20 LAB — BASIC METABOLIC PANEL
BUN/Creatinine Ratio: 20 (ref 12–28)
BUN: 22 mg/dL (ref 8–27)
CO2: 20 mmol/L (ref 20–29)
Calcium: 9.5 mg/dL (ref 8.7–10.3)
Chloride: 103 mmol/L (ref 96–106)
Creatinine, Ser: 1.1 mg/dL — ABNORMAL HIGH (ref 0.57–1.00)
GFR calc Af Amer: 55 mL/min/{1.73_m2} — ABNORMAL LOW (ref >59)
GFR calc non Af Amer: 48 mL/min/{1.73_m2} — ABNORMAL LOW (ref >59)
Glucose: 116 mg/dL — ABNORMAL HIGH (ref 65–99)
Potassium: 4 mmol/L (ref 3.5–5.2)
Sodium: 143 mmol/L (ref 134–144)

## 2019-06-27 ENCOUNTER — Telehealth: Payer: Medicare Other | Admitting: Interventional Cardiology

## 2019-07-15 ENCOUNTER — Other Ambulatory Visit: Payer: Self-pay | Admitting: Interventional Cardiology

## 2019-07-15 MED ORDER — ATENOLOL 25 MG PO TABS: 25.0000 mg | ORAL_TABLET | Freq: Every day | ORAL | 2 refills | Status: DC

## 2019-09-15 IMAGING — MR MR HEAD W/O CM
8 of 10 series · 36 of 48 positions shown · non-contrast
Comparison: Head CT 07/31/2017

CLINICAL DATA: Loss of vision in the left eye. Possible temporal
arteritis.

EXAM:
MRI HEAD WITHOUT CONTRAST
TECHNIQUE: Multiplanar, multiecho pulse sequences of the brain and surrounding
structures were obtained without intravenous contrast.

[Series 3: DWI · axial · 3.0mm · 1.09mm/px · z∈[-73,+74]mm · 8 of 100 slices shown (1 of 4)]
[im 1/100]
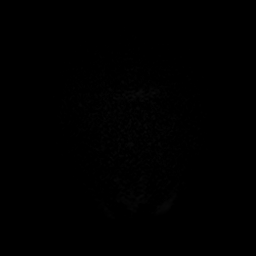
[im 12/100]
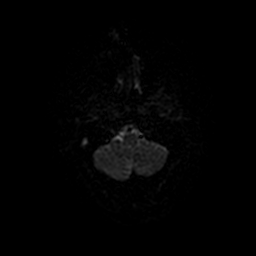
[im 34/100]
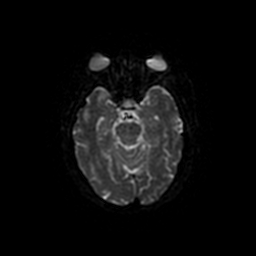
[im 45/100]
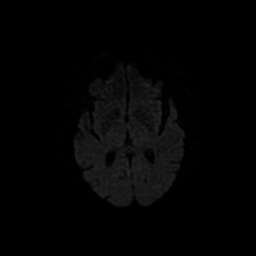
[im 56/100]
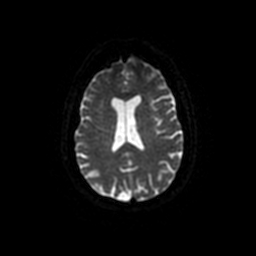
[im 67/100]
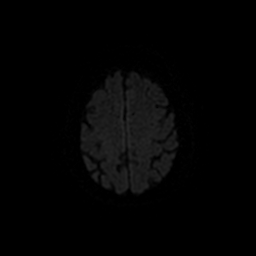
[im 89/100]
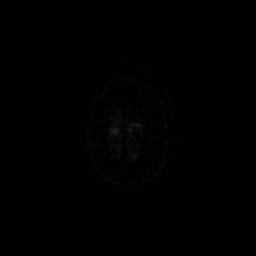
[im 100/100]
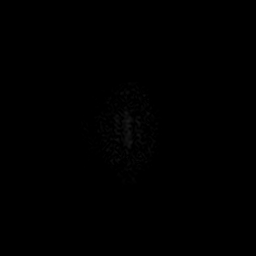

[Series 4: T1 · sagittal · 5.0mm · 0.47mm/px · 2 of 24 slices shown]
[im 1/24]
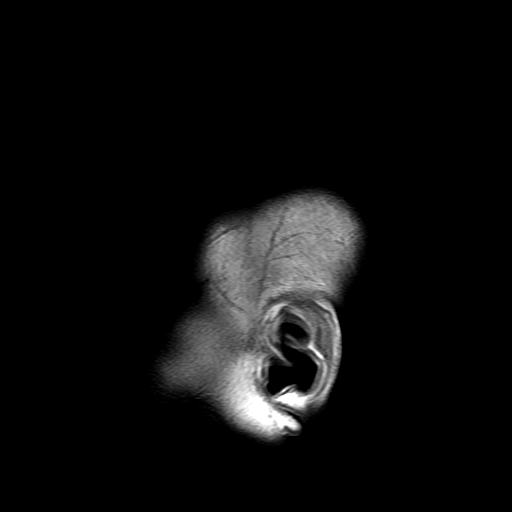
[im 12/24]
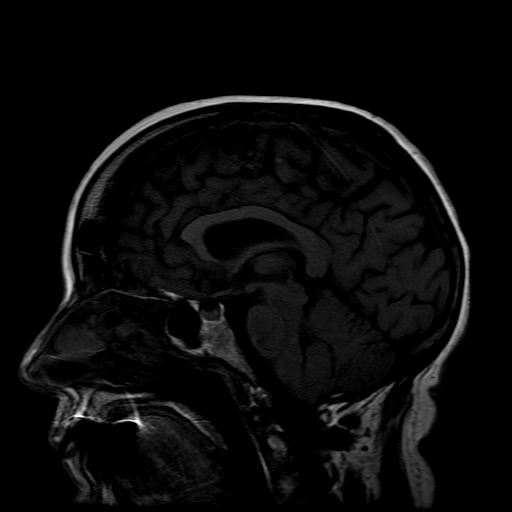

[Series 5: DWI · coronal · 5.0mm · 1.09mm/px · 8 of 76 slices shown (2 of 4)]
[im 1/76]
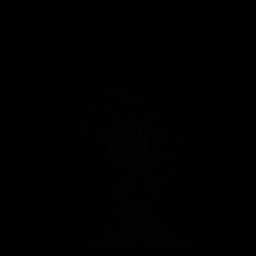
[im 11/76]
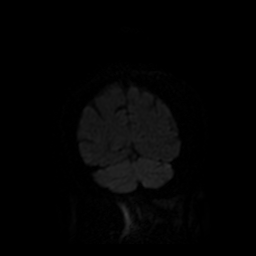
[im 22/76]
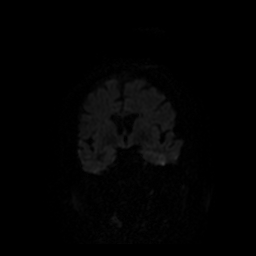
[im 33/76]
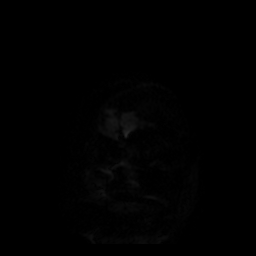
[im 43/76]
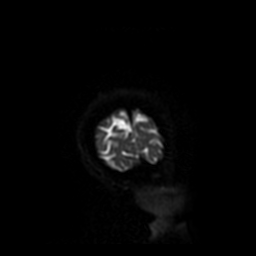
[im 54/76]
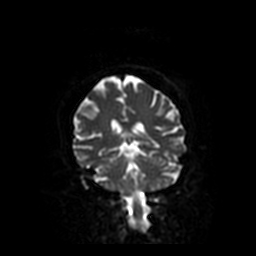
[im 65/76]
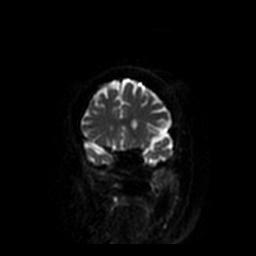
[im 76/76]
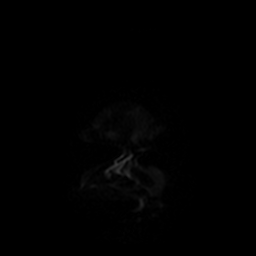

[Series 6: T2 · axial · 5.0mm · 0.43mm/px · z∈[-56,+96]mm · 3 of 23 slices shown (1 of 2)]
[im 1/23]
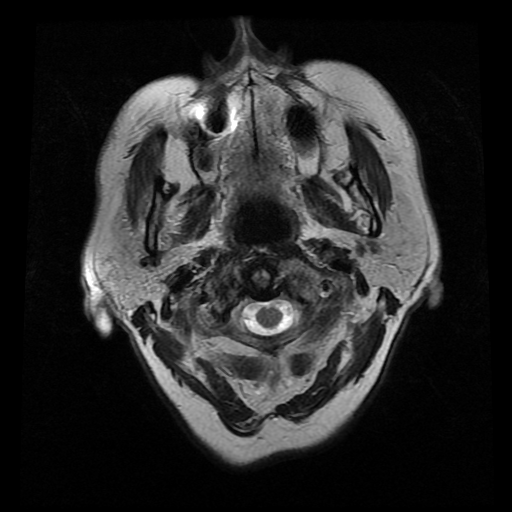
[im 12/23]
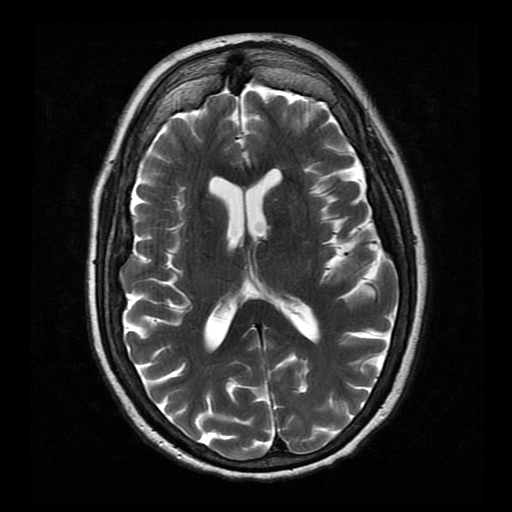
[im 23/23]
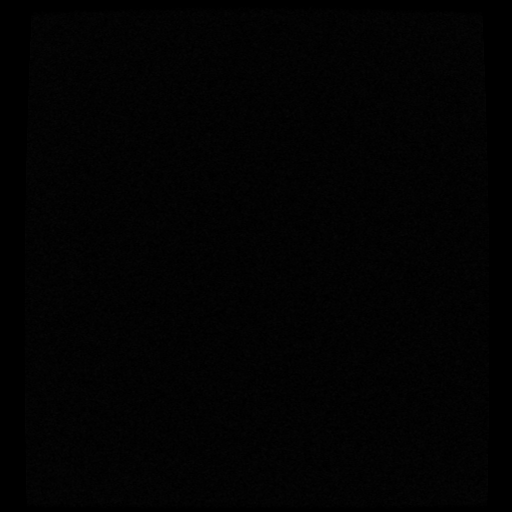

[Series 7: FLAIR · axial · 3.0mm · 0.43mm/px · z∈[-58,+91]mm · 3 of 26 slices shown]
[im 1/26]
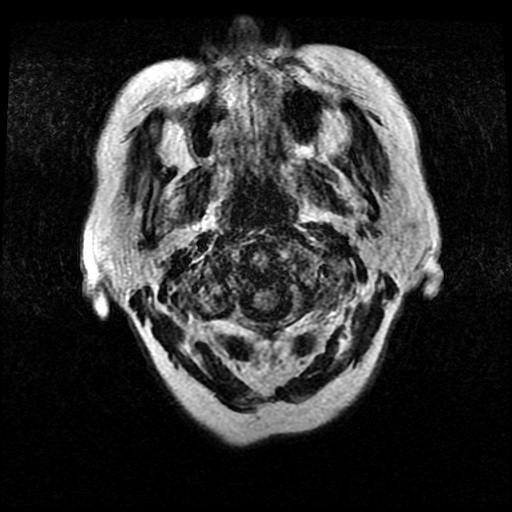
[im 13/26]
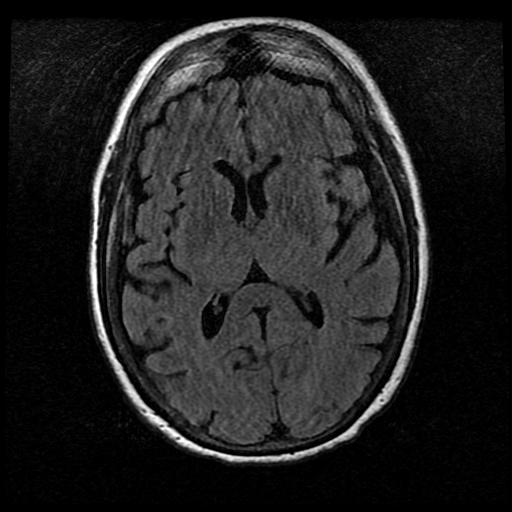
[im 26/26]
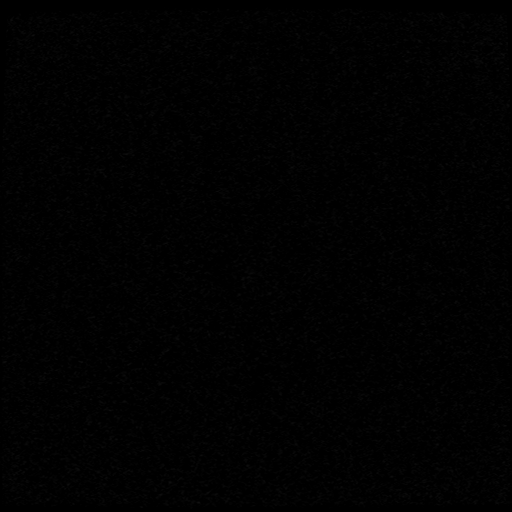

[Series 10: T2 · coronal · 5.0mm · 0.45mm/px · 3 of 27 slices shown (2 of 2)]
[im 1/27]
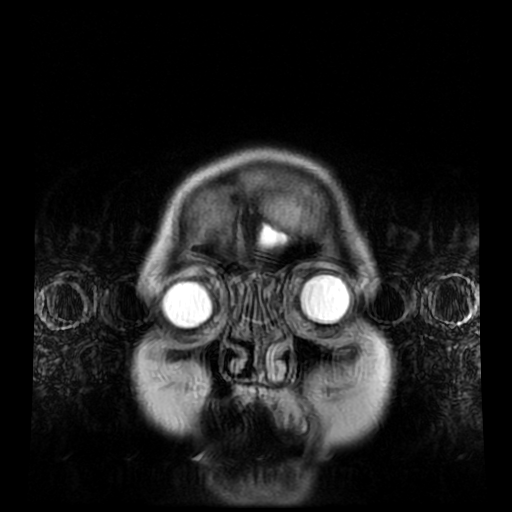
[im 14/27]
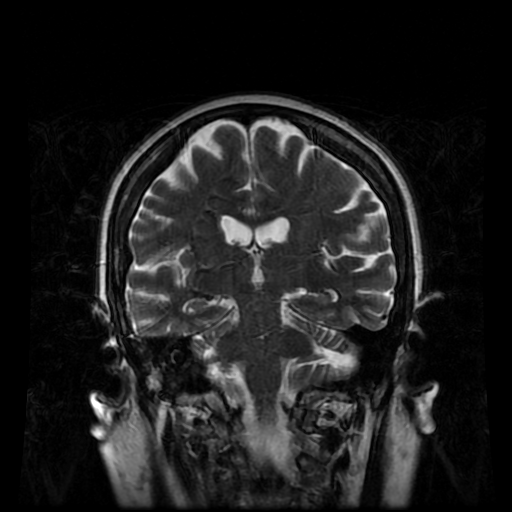
[im 27/27]
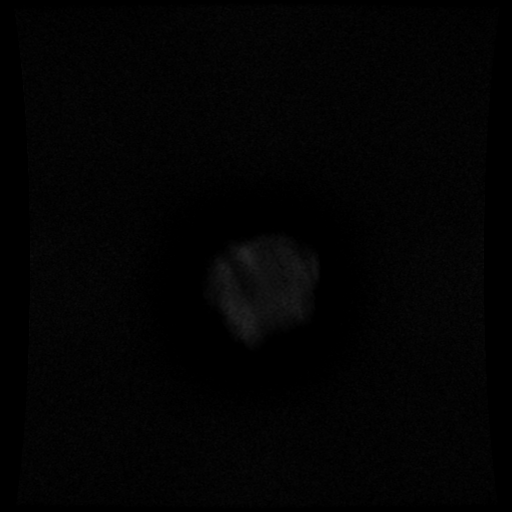

[Series 300: DWI · axial · 3.0mm · 1.09mm/px · z∈[-73,+74]mm · 5 of 50 slices shown (3 of 4)]
[im 1/50]
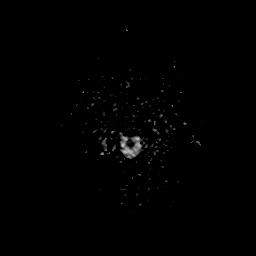
[im 13/50]
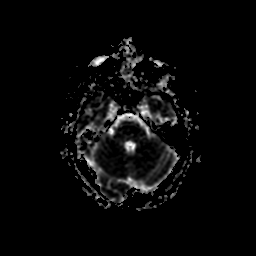
[im 25/50]
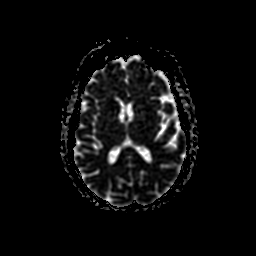
[im 37/50]
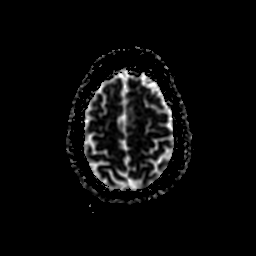
[im 50/50]
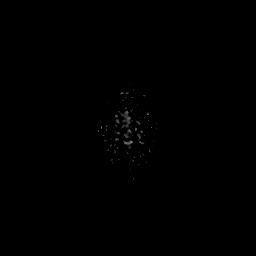

[Series 500: DWI · coronal · 5.0mm · 1.09mm/px · 4 of 38 slices shown (4 of 4)]
[im 1/38]
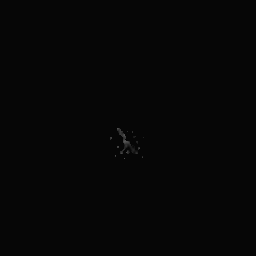
[im 13/38]
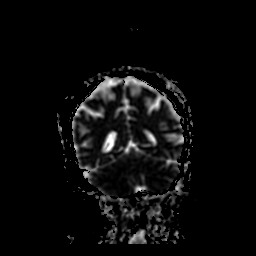
[im 25/38]
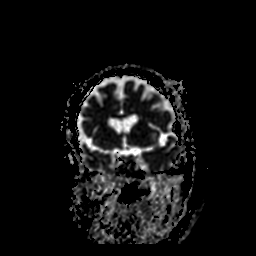
[im 38/38]
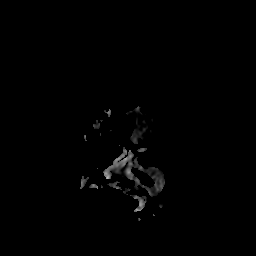

[36 of 48 positions shown; findings below may reference images not displayed]

FINDINGS: Brain: The midline structures are normal. There is no focal
diffusion restriction to indicate acute infarct. The brain
parenchymal signal is normal and there is no mass lesion. No
intraparenchymal hematoma or chronic microhemorrhage. Brain volume
is normal for age without lobar predominant atrophy. The dura is
normal and there is no extra-axial collection.

Vascular: Major intracranial arterial and venous sinus flow voids
are preserved.

Skull and upper cervical spine: The visualized skull base,
calvarium, upper cervical spine and extracranial soft tissues are
normal.

Sinuses/Orbits: No fluid levels or advanced mucosal thickening. No
mastoid or middle ear effusion. Normal orbits.
IMPRESSION: 1. No acute intracranial abnormality.
2. No focal lesion along the optic pathways or other finding to
explain the reported vision loss.

## 2019-10-01 ENCOUNTER — Other Ambulatory Visit: Payer: Self-pay | Admitting: Physician Assistant

## 2019-12-22 ENCOUNTER — Ambulatory Visit: Payer: Medicare Other

## 2019-12-25 ENCOUNTER — Ambulatory Visit: Payer: Medicare Other | Attending: Family

## 2019-12-25 DIAGNOSIS — Z23 Encounter for immunization: Secondary | ICD-10-CM

## 2019-12-25 NOTE — Progress Notes (Signed)
   Covid-19 Vaccination Clinic  Name:  Jody Shaw    MRN: [id number] DOB: 1941-11-05  12/25/2019  Jody Shaw was observed post Covid-19 immunization for 15 minutes without incidence. She was provided with Vaccine Information Sheet and instruction to access the V-Safe system.   Jody Shaw was instructed to call 911 with any severe reactions post vaccine: Marland Kitchen Difficulty breathing  . Swelling of your face and throat  . A fast heartbeat  . A bad rash all over your body  . Dizziness and weakness    Immunizations Administered    Name Date Dose VIS Date Route   Moderna COVID-19 Vaccine 12/25/2019  2:26 PM 0.5 mL 10/14/2019 Intramuscular   Manufacturer: Moderna   Lot: [id number]   NDC: 86148-307-35

## 2020-01-12 ENCOUNTER — Other Ambulatory Visit: Payer: Self-pay | Admitting: Interventional Cardiology

## 2020-01-27 ENCOUNTER — Ambulatory Visit: Payer: Medicare Other | Attending: Family

## 2020-01-27 DIAGNOSIS — Z23 Encounter for immunization: Secondary | ICD-10-CM

## 2020-04-10 ENCOUNTER — Other Ambulatory Visit: Payer: Self-pay | Admitting: Interventional Cardiology

## 2020-05-01 ENCOUNTER — Other Ambulatory Visit: Payer: Self-pay | Admitting: Physician Assistant

## 2020-05-26 ENCOUNTER — Telehealth: Payer: Self-pay | Admitting: Interventional Cardiology

## 2020-05-26 MED ORDER — ISOSORBIDE MONONITRATE ER 60 MG PO TB24: 60.0000 mg | ORAL_TABLET | Freq: Every day | ORAL | 0 refills | Status: DC

## 2020-05-26 NOTE — Telephone Encounter (Signed)
*  STAT* If patient is at the pharmacy, call can be transferred to refill team.   1. Which medications need to be refilled? (please list name of each medication and dose if known)  isosorbide mononitrate (IMDUR) 60 MG 24 hr tablet  2. Which pharmacy/location (including street and city if local pharmacy) is medication to be sent to? [facility] [phone] [facility] - [place], [place] - [address]  3. Do they need a 30 day or 90 day supply? 90 day supply

## 2020-05-26 NOTE — Telephone Encounter (Signed)
Pt's medication was sent to pt's pharmacy as requested. Confirmation received.  °

## 2020-07-05 ENCOUNTER — Other Ambulatory Visit: Payer: Self-pay | Admitting: Interventional Cardiology

## 2020-07-18 ENCOUNTER — Other Ambulatory Visit: Payer: Self-pay | Admitting: Interventional Cardiology

## 2020-08-05 ENCOUNTER — Ambulatory Visit: Payer: Medicare Other | Admitting: Interventional Cardiology

## 2020-08-18 ENCOUNTER — Other Ambulatory Visit: Payer: Self-pay | Admitting: Interventional Cardiology

## 2020-09-29 ENCOUNTER — Other Ambulatory Visit: Payer: Self-pay | Admitting: Interventional Cardiology

## 2020-10-11 ENCOUNTER — Telehealth: Payer: Self-pay | Admitting: Interventional Cardiology

## 2020-10-11 NOTE — Telephone Encounter (Signed)
*  STAT* If patient is at the pharmacy, call can be transferred to refill team.   1. Which medications need to be refilled? (please list name of each medication and dose if known) atenolol 2. Which pharmacy/location (including street and city if local pharmacy) is medication to be sent to?  [facility]   3. Do they need a 30 day or 90 day supply? 90

## 2020-10-11 NOTE — Telephone Encounter (Signed)
Called pt to inform her that her medication was sent to her pharmacy 09/29/20 with a 90 day supply and that she needed to called her pharmacy and speak with a live person to request this refill. I advised pt that if she has any other problems, questions or concerns, to give our office a call back.

## 2020-10-12 ENCOUNTER — Other Ambulatory Visit: Payer: Self-pay | Admitting: Interventional Cardiology

## 2020-10-12 ENCOUNTER — Ambulatory Visit: Payer: Medicare Other | Admitting: Interventional Cardiology

## 2020-10-19 ENCOUNTER — Other Ambulatory Visit: Payer: Self-pay | Admitting: Interventional Cardiology

## 2020-10-19 DIAGNOSIS — I10 Essential (primary) hypertension: Secondary | ICD-10-CM

## 2020-10-19 DIAGNOSIS — I2510 Atherosclerotic heart disease of native coronary artery without angina pectoris: Secondary | ICD-10-CM

## 2020-12-07 NOTE — Progress Notes (Deleted)
Cardiology Office Note:    Date:  12/07/2020   ID:  Jody Shaw, DOB [birth year], MRN [id number]  PCP:  Reynold Bowen, MD  Cardiologist:  Sinclair Grooms, MD   Referring MD: Reynold Bowen, MD   No chief complaint on file.   History of Present Illness:    Jody Shaw is a 80 y.o. 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  ***  Past Medical History:  Diagnosis Date  . Coronary artery disease    s/p rotational atherectomy to the LAD in 1996 // Nuc 9/13: no ischemia  . Echocardiogram    Echo 9/19:  EF 55-60, normal wall motion, grade 1 diastolic dysfunction, MAC  . GERD (gastroesophageal reflux disease)   . Giant cell arteritis ([place])   . HLD (hyperlipidemia)   . Hypothyroidism   . Vision loss of left eye     Past Surgical History:  Procedure Laterality Date  . ARTERY BIOPSY Left 08/03/2017   Procedure: BIOPSY TEMPORAL ARTERY;  Surgeon: Kieth Brightly Arta Bruce, MD;  Location: [facility];  Service: General;  Laterality: Left;    Current Medications: No outpatient medications have been marked as taking for the 12/10/20 encounter (Appointment) with Belva Crome, MD.     Allergies:   Iodine, Septra [sulfamethoxazole-trimethoprim], Sulfa antibiotics, and Watermelon [citrullus vulgaris]   Social History   Socioeconomic History  . Marital status: Married    Spouse name: Not on file  . Number of children: Not on file  . Years of education: Not on file  . Highest education level: Not on file  Occupational History  . Not on file  Tobacco Use  . Smoking status: Former Research scientist (life sciences)  . Smokeless tobacco: Never Used  Vaping Use  . Vaping Use: Never used  Substance and Sexual Activity  . Alcohol use: No  . Drug use: No  . Sexual activity: Not on file  Other Topics Concern  . Not on file  Social History Narrative  . Not on file    Social Determinants of Health   Financial Resource Strain: Not on file  Food Insecurity: Not on file  Transportation Needs: Not on file  Physical Activity: Not on file  Stress: Not on file  Social Connections: Not on file     Family History: The patient's family history includes Heart disease in her father and mother.  ROS:   Please see the history of present illness.    *** All other systems reviewed and are negative.  EKGs/Labs/Other Studies Reviewed:    The following studies were reviewed today: ***  EKG:  EKG ***  Recent Labs: No results found for requested labs within last 8760 hours.  Recent Lipid Panel No results found for: CHOL, TRIG, HDL, CHOLHDL, VLDL, LDLCALC, LDLDIRECT  Physical Exam:    VS:  There were no vitals taken for this visit.    Wt Readings from Last 3 Encounters:  05/09/19 205 lb (93 kg)  09/18/18 223 lb (101.2 kg)  04/29/18 209 lb 12.8 oz (95.2 kg)     GEN: ***. No acute distress HEENT: Normal NECK: No JVD. LYMPHATICS: No lymphadenopathy CARDIAC: *** murmur. RRR *** gallop, or edema. VASCULAR: *** Normal Pulses. No bruits. RESPIRATORY:  Clear to auscultation without rales, wheezing or rhonchi  ABDOMEN: Soft, non-tender, non-distended, No pulsatile mass, MUSCULOSKELETAL: No deformity  SKIN: Warm and dry NEUROLOGIC:  Alert and  oriented x 3 PSYCHIATRIC:  Normal affect   ASSESSMENT:    1. Coronary artery disease involving native coronary artery of native heart without angina pectoris   2. Essential hypertension   3. Mixed hyperlipidemia   4. Renal insufficiency   5. Giant cell arteritis ([place])   6. Educated about COVID-19 virus infection    PLAN:    In order of problems listed above:  1. ***   Medication Adjustments/Labs and Tests Ordered: Current medicines are reviewed at length with the patient today.  Concerns regarding medicines are outlined above.  No orders of the defined types were placed in this encounter.  No  orders of the defined types were placed in this encounter.   There are no Patient Instructions on file for this visit.   Signed, Sinclair Grooms, MD  12/07/2020 6:08 PM    [place]

## 2020-12-09 ENCOUNTER — Encounter: Payer: Self-pay | Admitting: Interventional Cardiology

## 2020-12-09 NOTE — Telephone Encounter (Signed)
Error

## 2020-12-10 ENCOUNTER — Ambulatory Visit: Payer: Medicare Other | Admitting: Interventional Cardiology

## 2020-12-10 DIAGNOSIS — I10 Essential (primary) hypertension: Secondary | ICD-10-CM

## 2020-12-10 DIAGNOSIS — E782 Mixed hyperlipidemia: Secondary | ICD-10-CM

## 2020-12-10 DIAGNOSIS — N289 Disorder of kidney and ureter, unspecified: Secondary | ICD-10-CM

## 2020-12-10 DIAGNOSIS — M316 Other giant cell arteritis: Secondary | ICD-10-CM

## 2020-12-10 DIAGNOSIS — I2510 Atherosclerotic heart disease of native coronary artery without angina pectoris: Secondary | ICD-10-CM

## 2020-12-10 DIAGNOSIS — Z7189 Other specified counseling: Secondary | ICD-10-CM

## 2020-12-12 ENCOUNTER — Other Ambulatory Visit: Payer: Self-pay | Admitting: Interventional Cardiology

## 2020-12-13 NOTE — Telephone Encounter (Signed)
Ok to fill until upcoming appt.  Leave the note on there about keeping appt for future refills.  If appt cancelled again, will need to get from PCP.  Thanks!

## 2020-12-13 NOTE — Telephone Encounter (Signed)
Pt's pharmacy is requesting a refill on rosuvastatin. Pt has cancelled appt with Dr. Katrinka Blazing numerous times and rescheduled. Would Dr. Katrinka Blazing like to refill this medication, pt made another appt for May? Please address

## 2020-12-28 ENCOUNTER — Other Ambulatory Visit: Payer: Self-pay | Admitting: Interventional Cardiology

## 2020-12-28 DIAGNOSIS — I2510 Atherosclerotic heart disease of native coronary artery without angina pectoris: Secondary | ICD-10-CM

## 2021-01-06 ENCOUNTER — Other Ambulatory Visit: Payer: Self-pay | Admitting: Interventional Cardiology

## 2021-01-06 DIAGNOSIS — I10 Essential (primary) hypertension: Secondary | ICD-10-CM

## 2021-03-15 ENCOUNTER — Other Ambulatory Visit: Payer: Self-pay | Admitting: Interventional Cardiology

## 2021-03-15 DIAGNOSIS — I2510 Atherosclerotic heart disease of native coronary artery without angina pectoris: Secondary | ICD-10-CM

## 2021-04-06 ENCOUNTER — Ambulatory Visit: Payer: Medicare Other | Admitting: Interventional Cardiology

## 2021-04-11 ENCOUNTER — Other Ambulatory Visit: Payer: Self-pay | Admitting: Interventional Cardiology

## 2021-04-12 ENCOUNTER — Other Ambulatory Visit: Payer: Self-pay | Admitting: Interventional Cardiology

## 2021-04-12 DIAGNOSIS — I2510 Atherosclerotic heart disease of native coronary artery without angina pectoris: Secondary | ICD-10-CM

## 2021-04-20 NOTE — Progress Notes (Signed)
Cardiology Office Note:    Date:  04/21/2021   ID:  Jody Shaw, DOB [birth year], MRN [id number]  PCP:  Jody Bowen, MD  Cardiologist:  Jody Grooms, MD   Referring MD: Jody Bowen, MD   Chief Complaint  Patient presents with   Coronary Artery Disease     History of Present Illness:    Jody Shaw is a 80 y.o. 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  She was last seen by Dr.Katalyn Matin June 2019.  Echocardiogram in September 2019 demonstrated normal LV function with mild diastolic dysfunction.  She is doing okay.  No angina.  Denies orthopnea PND.  No palpitations.  No episodes of syncope.  She does have significant right greater than left lower extremity edema.  The edema is on the dorsum of the feet and in the ankles.  No skin breakdown is noted.  This edema has been present since she began therapy for giant cell arteritis and had to use high-dose prednisone and methotrexate.  Past Medical History:  Diagnosis Date   Coronary artery disease    s/p rotational atherectomy to the LAD in 1996 // Nuc 9/13: no ischemia   Echocardiogram    Echo 9/19:  EF 55-60, normal wall motion, grade 1 diastolic dysfunction, MAC   GERD (gastroesophageal reflux disease)    Giant cell arteritis ([place])    HLD (hyperlipidemia)    Hypothyroidism    Vision loss of left eye     Past Surgical History:  Procedure Laterality Date   ARTERY BIOPSY Left 08/03/2017   Procedure: BIOPSY TEMPORAL ARTERY;  Surgeon: Mickeal Skinner, MD;  Location: [facility];  Service: General;  Laterality: Left;    Current Medications: Current Meds  Medication Sig   aspirin 81 MG tablet Take 81 mg by mouth daily.    atenolol (TENORMIN) 25 MG tablet Take 1 tablet by mouth every day. Please keep upcoming appt for future refills.   b complex vitamins capsule Take 1 capsule by  mouth daily.   folic acid (FOLVITE) 1 MG tablet Take 1 mg by mouth daily.   isosorbide mononitrate (IMDUR) 60 MG 24 hr tablet TAKE 1 TABLET BY MOUTH EVERY DAY. NEEDS TO KEEP APPOINTMENT IN JUNE FOR FURTHER REFILLS.   lansoprazole (PREVACID) 30 MG capsule Take 30 mg by mouth daily as needed (HEARTBURN).    levothyroxine (SYNTHROID, LEVOTHROID) 88 MCG tablet Take 88 mcg by mouth daily before breakfast.   losartan (COZAAR) 50 MG tablet Take 50 mg by mouth daily.   methotrexate (RHEUMATREX) 2.5 MG tablet Take 10 mg by mouth once a week. Caution:Chemotherapy. Protect from light. Take 5 tabs every friday   Multiple Vitamin (MULTI VITAMIN DAILY) TABS Take 2 tablets by mouth daily.    nitroGLYCERIN (NITROSTAT) 0.4 MG SL tablet Place 0.4 mg under the tongue every 5 (five) minutes as needed for chest pain.   potassium chloride SA (K-DUR) 20 MEQ tablet Take 1 tablet (20 mEq total) by mouth daily.   PREDNISONE, PAK, PO Take 1 mg by mouth as directed.   rosuvastatin (CRESTOR) 10 MG tablet Take 1 tablet (10 mg total) by mouth 2 (two) times a week. Please keep upcoming appt in May 2022 or you will need to refill with PCP. Final Attempt   Tocilizumab (ACTEMRA [place]) Take one (1) injection into the skin once weekly.   torsemide (DEMADEX)  20 MG tablet Take 1 tablet (20 mg total) by mouth daily.     Allergies:   Iodine, Septra [sulfamethoxazole-trimethoprim], Sulfa antibiotics, and Watermelon [citrullus vulgaris]   Social History   Socioeconomic History   Marital status: Married    Spouse name: Not on file   Number of children: Not on file   Years of education: Not on file   Highest education level: Not on file  Occupational History   Not on file  Tobacco Use   Smoking status: Former    Pack years: 0.00   Smokeless tobacco: Never  Vaping Use   Vaping Use: Never used  Substance and Sexual Activity   Alcohol use: No   Drug use: No   Sexual activity: Not on file  Other Topics Concern   Not on file   Social History Narrative   Not on file   Social Determinants of Health   Financial Resource Strain: Not on file  Food Insecurity: Not on file  Transportation Needs: Not on file  Physical Activity: Not on file  Stress: Not on file  Social Connections: Not on file     Family History: The patient's family history includes Heart disease in her father and mother.  ROS:   Please see the history of present illness.    Blind in the left eye related to giant cell arteritis.  Lower extremity swelling and discomfort.  Unable to wear her natural shoes.  Has compression stockings but does not wear them.  All other systems reviewed and are negative.  EKGs/Labs/Other Studies Reviewed:    The following studies were reviewed today: No new or recent imaging data  EKG:  EKG normal sinus rhythm, right bundle branch block, otherwise unremarkable.  When compared to the tracing of 2019.  Recent Labs: No results found for requested labs within last 8760 hours.  Recent Lipid Panel No results found for: CHOL, TRIG, HDL, CHOLHDL, VLDL, LDLCALC, LDLDIRECT  Physical Exam:    VS:  BP 122/82   Pulse 86   Ht _0  (1.575 m)   Wt 197 lb (89.4 kg)   SpO2 98%   BMI 36.03 kg/m     Wt Readings from Last 3 Encounters:  04/21/21 197 lb (89.4 kg)  05/09/19 205 lb (93 kg)  09/18/18 223 lb (101.2 kg)     GEN: Moderate obesity. No acute distress HEENT: Normal NECK: No JVD. LYMPHATICS: No lymphadenopathy CARDIAC: No murmur. RRR no gallop, or edema. VASCULAR:  Normal Pulses. No bruits. RESPIRATORY:  Clear to auscultation without rales, wheezing or rhonchi  ABDOMEN: Soft, non-tender, non-distended, No pulsatile mass, MUSCULOSKELETAL: No deformity  SKIN: Warm and dry NEUROLOGIC:  Alert and oriented x 3 PSYCHIATRIC:  Normal affect   ASSESSMENT:    1. Coronary artery disease involving native coronary artery of native heart without angina pectoris   2. Essential hypertension   3. Giant cell  arteritis ([place])   4. Mixed hyperlipidemia   5. Renal insufficiency    PLAN:    In order of problems listed above:  Secondary prevention discussed.  Continue Imdur and aspirin.  Continue Crestor.   Excellent blood pressure control.  Continue Cozaar and Tenormin.  Demadex is also helping to control blood pressure. Discussed only from the standpoint of her vision.  Continues on methotrexate. Continue moderate intensity statin therapy.  Most recent LDL was 76. Kidney function is normal when most recently checked in March.  Overall education and awareness concerning primary/secondary risk prevention was discussed in  detail: LDL less than 70, hemoglobin A1c less than 7, blood pressure target less than 130/80 mmHg, >150 minutes of moderate aerobic activity per week, avoidance of smoking, weight control (via diet and exercise), and continued surveillance/management of/for obstructive sleep apnea.  Medication Adjustments/Labs and Tests Ordered: Current medicines are reviewed at length with the patient today.  Concerns regarding medicines are outlined above.  No orders of the defined types were placed in this encounter.  No orders of the defined types were placed in this encounter.   There are no Patient Instructions on file for this visit.   Signed, Jody Grooms, MD  04/21/2021 3:29 PM    [facility]

## 2021-04-21 ENCOUNTER — Ambulatory Visit: Payer: Medicare Other | Admitting: Interventional Cardiology

## 2021-04-21 ENCOUNTER — Encounter: Payer: Self-pay | Admitting: Interventional Cardiology

## 2021-04-21 ENCOUNTER — Other Ambulatory Visit: Payer: Self-pay

## 2021-04-21 VITALS — BP 122/82 | HR 86 | Ht 62.0 in | Wt 197.0 lb

## 2021-04-21 DIAGNOSIS — I2510 Atherosclerotic heart disease of native coronary artery without angina pectoris: Secondary | ICD-10-CM

## 2021-04-21 DIAGNOSIS — I10 Essential (primary) hypertension: Secondary | ICD-10-CM | POA: Diagnosis not present

## 2021-04-21 DIAGNOSIS — E782 Mixed hyperlipidemia: Secondary | ICD-10-CM

## 2021-04-21 DIAGNOSIS — M316 Other giant cell arteritis: Secondary | ICD-10-CM

## 2021-04-21 DIAGNOSIS — N289 Disorder of kidney and ureter, unspecified: Secondary | ICD-10-CM

## 2021-04-21 NOTE — Patient Instructions (Signed)

## 2021-04-29 ENCOUNTER — Other Ambulatory Visit: Payer: Self-pay | Admitting: Interventional Cardiology

## 2021-06-30 ENCOUNTER — Other Ambulatory Visit: Payer: Self-pay | Admitting: Interventional Cardiology

## 2021-06-30 DIAGNOSIS — I2510 Atherosclerotic heart disease of native coronary artery without angina pectoris: Secondary | ICD-10-CM

## 2021-06-30 DIAGNOSIS — I10 Essential (primary) hypertension: Secondary | ICD-10-CM

## 2021-08-21 ENCOUNTER — Other Ambulatory Visit: Payer: Self-pay | Admitting: Interventional Cardiology

## 2022-05-27 ENCOUNTER — Other Ambulatory Visit: Payer: Self-pay | Admitting: Interventional Cardiology

## 2022-06-24 ENCOUNTER — Other Ambulatory Visit: Payer: Self-pay | Admitting: Interventional Cardiology

## 2022-06-26 ENCOUNTER — Other Ambulatory Visit: Payer: Self-pay

## 2022-07-14 ENCOUNTER — Other Ambulatory Visit: Payer: Self-pay | Admitting: Interventional Cardiology

## 2022-07-14 DIAGNOSIS — I2510 Atherosclerotic heart disease of native coronary artery without angina pectoris: Secondary | ICD-10-CM

## 2022-07-29 ENCOUNTER — Other Ambulatory Visit: Payer: Self-pay | Admitting: Interventional Cardiology

## 2022-08-07 ENCOUNTER — Other Ambulatory Visit: Payer: Self-pay | Admitting: Interventional Cardiology

## 2022-08-07 DIAGNOSIS — I2510 Atherosclerotic heart disease of native coronary artery without angina pectoris: Secondary | ICD-10-CM

## 2022-08-07 DIAGNOSIS — I10 Essential (primary) hypertension: Secondary | ICD-10-CM

## 2022-08-10 ENCOUNTER — Other Ambulatory Visit: Payer: Self-pay | Admitting: Endocrinology

## 2022-08-10 DIAGNOSIS — N6310 Unspecified lump in the right breast, unspecified quadrant: Secondary | ICD-10-CM

## 2022-08-12 ENCOUNTER — Other Ambulatory Visit: Payer: Self-pay | Admitting: Interventional Cardiology

## 2022-08-17 ENCOUNTER — Ambulatory Visit
Admission: RE | Admit: 2022-08-17 | Discharge: 2022-08-17 | Disposition: A | Discharge status: Home / Self Care | Payer: Medicare Other | Source: Ambulatory Visit | Attending: Endocrinology | Admitting: Endocrinology

## 2022-08-17 DIAGNOSIS — N6310 Unspecified lump in the right breast, unspecified quadrant: Secondary | ICD-10-CM

## 2022-08-18 ENCOUNTER — Other Ambulatory Visit: Payer: Self-pay | Admitting: Endocrinology

## 2022-08-18 DIAGNOSIS — N6314 Unspecified lump in the right breast, lower inner quadrant: Secondary | ICD-10-CM

## 2022-08-21 ENCOUNTER — Other Ambulatory Visit: Payer: Self-pay | Admitting: Interventional Cardiology

## 2022-08-21 DIAGNOSIS — I2510 Atherosclerotic heart disease of native coronary artery without angina pectoris: Secondary | ICD-10-CM

## 2022-08-21 DIAGNOSIS — I10 Essential (primary) hypertension: Secondary | ICD-10-CM

## 2022-08-29 NOTE — Progress Notes (Signed)
No show

## 2022-08-30 ENCOUNTER — Encounter: Payer: Medicare Other | Admitting: Physician Assistant

## 2022-08-30 DIAGNOSIS — I10 Essential (primary) hypertension: Secondary | ICD-10-CM

## 2022-08-30 DIAGNOSIS — N289 Disorder of kidney and ureter, unspecified: Secondary | ICD-10-CM

## 2022-08-30 DIAGNOSIS — M316 Other giant cell arteritis: Secondary | ICD-10-CM

## 2022-08-30 DIAGNOSIS — E782 Mixed hyperlipidemia: Secondary | ICD-10-CM

## 2022-08-30 DIAGNOSIS — I2510 Atherosclerotic heart disease of native coronary artery without angina pectoris: Secondary | ICD-10-CM

## 2022-08-31 ENCOUNTER — Other Ambulatory Visit: Payer: Self-pay | Admitting: Interventional Cardiology

## 2022-08-31 ENCOUNTER — Telehealth: Payer: Self-pay | Admitting: Interventional Cardiology

## 2022-08-31 DIAGNOSIS — I10 Essential (primary) hypertension: Secondary | ICD-10-CM

## 2022-08-31 NOTE — Telephone Encounter (Signed)
Patient appt was made

## 2022-09-07 ENCOUNTER — Other Ambulatory Visit: Payer: Self-pay | Admitting: Interventional Cardiology

## 2022-09-15 ENCOUNTER — Other Ambulatory Visit: Payer: Self-pay | Admitting: Interventional Cardiology

## 2022-09-15 DIAGNOSIS — I2510 Atherosclerotic heart disease of native coronary artery without angina pectoris: Secondary | ICD-10-CM

## 2022-09-21 ENCOUNTER — Ambulatory Visit: Payer: Medicare Other | Admitting: Physician Assistant

## 2022-10-19 ENCOUNTER — Encounter: Payer: Self-pay | Admitting: Physician Assistant

## 2022-10-19 ENCOUNTER — Ambulatory Visit: Payer: Medicare Other | Attending: Physician Assistant | Admitting: Physician Assistant

## 2022-10-19 VITALS — BP 132/72 | HR 60 | Ht 63.0 in | Wt 206.2 lb

## 2022-10-19 DIAGNOSIS — Z7189 Other specified counseling: Secondary | ICD-10-CM

## 2022-10-19 DIAGNOSIS — N289 Disorder of kidney and ureter, unspecified: Secondary | ICD-10-CM

## 2022-10-19 DIAGNOSIS — M316 Other giant cell arteritis: Secondary | ICD-10-CM | POA: Diagnosis not present

## 2022-10-19 DIAGNOSIS — E782 Mixed hyperlipidemia: Secondary | ICD-10-CM | POA: Diagnosis not present

## 2022-10-19 DIAGNOSIS — I10 Essential (primary) hypertension: Secondary | ICD-10-CM | POA: Diagnosis not present

## 2022-10-19 DIAGNOSIS — I2510 Atherosclerotic heart disease of native coronary artery without angina pectoris: Secondary | ICD-10-CM | POA: Diagnosis not present

## 2022-10-19 DIAGNOSIS — R600 Localized edema: Secondary | ICD-10-CM

## 2022-10-19 MED ORDER — TORSEMIDE 20 MG PO TABS: 20.0000 mg | ORAL_TABLET | Freq: Every day | ORAL | 3 refills | Status: DC

## 2022-10-19 MED ORDER — NITROGLYCERIN 0.4 MG SL SUBL: 0.4000 mg | SUBLINGUAL_TABLET | SUBLINGUAL | 3 refills | Status: DC | PRN

## 2022-10-19 NOTE — Patient Instructions (Signed)
Medication Instructions:  Your physician has recommended you make the following change in your medication:   RESTART: torsemide 20 mg once a day  *If you need a refill on your cardiac medications before your next appointment, please call your pharmacy*   Lab Work: Your physician recommends that you return for lab work in: 2 weeks for BMET  If you have labs (blood work) drawn today and your tests are completely normal, you will receive your results only by: MyChart Message (if you have MyChart) OR A paper copy in the mail If you have any lab test that is abnormal or we need to change your treatment, we will call you to review the results.   Testing/Procedures: Your physician has requested that you have an echocardiogram. Echocardiography is a painless test that uses sound waves to create images of your heart. It provides your doctor with information about the size and shape of your heart and how well your heart's chambers and valves are working. This procedure takes approximately one hour. There are no restrictions for this procedure. Please do NOT wear cologne, perfume, aftershave, or lotions (deodorant is allowed). Please arrive 15 minutes prior to your appointment time.   Follow-Up: At [facility], you and your health needs are our priority.  As part of our continuing mission to provide you with exceptional heart care, we have created designated Provider Care Teams.  These Care Teams include your primary Cardiologist (physician) and Advanced Practice Providers (APPs -  Physician Assistants and Nurse Practitioners) who all work together to provide you with the care you need, when you need it.  We recommend signing up for the patient portal called "MyChart".  Sign up information is provided on this After Visit Summary.  MyChart is used to connect with patients for Virtual Visits (Telemedicine).  Patients are able to view lab/test results, encounter notes, upcoming appointments,  etc.  Non-urgent messages can be sent to your provider as well.   To learn more about what you can do with MyChart, go to [email or web address].    Your next appointment:   1 month(s)  The format for your next appointment:   In Person  Provider:   Jari Favre, PA Other Instructions Your physician recommends that you weigh, daily, at the same time every day, and in the same amount of clothing. Please record your daily weights on the handout provided and bring it to your next appointment.   Your provider recommends that you wear compression stockings. Handout given.  Important Information About Sugar

## 2022-10-19 NOTE — Progress Notes (Addendum)
Office Visit    Patient Name: Jody Shaw Date of Encounter: 10/19/2022  PCP:  Reynold Bowen, [place]  Cardiologist:  Sinclair Grooms, MD  Advanced Practice Provider:  No care team member to display Electrophysiologist:  None   HPI    Jody Shaw is a 81 y.o. 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  She was last seen 04/2021 by Dr. Tamala Julian.  She had an echocardiogram September 2019 which demonstrated normal LV function and mild diastolic dysfunction.  When she was last seen she was doing okay without any angina.  No palpitations.  No episodes of syncope.  She did have significant right greater than left lower extremity edema.  Edema was on the dorsum of the feet and in the ankles.  No skin breakdown noted.  This edema has been present since she had began therapy for giant cell arteritis and had to be on high-dose prednisone and methotrexate.  Today, she feels okay today. She does have significant  lower extremity edema. She stopped her diuretic because she read that it does not help with lower extremity edema. We discussed that it does in fact help and she needs to take her diuretic. We also discussed low sodium diet and elevating her legs when able. We discussed compression if she is able to tolerate it. If redness persists, she may need to start an antibiotic. No cuts identified as a source for cellulitis. Color is slightly pink today.  Reports no shortness of breath nor dyspnea on exertion. Reports no chest pain, pressure, or tightness. Reports no palpitations.   Past Medical History    Past Medical History:  Diagnosis Date   Coronary artery disease    s/p rotational atherectomy to the LAD in 1996 // Nuc 9/13: no ischemia    Echocardiogram    Echo 9/19:  EF 55-60, normal wall motion, grade 1 diastolic dysfunction, MAC   GERD (gastroesophageal reflux disease)    Giant cell arteritis ([place])    HLD (hyperlipidemia)    Hypothyroidism    Vision loss of left eye    Past Surgical History:  Procedure Laterality Date   ARTERY BIOPSY Left 08/03/2017   Procedure: BIOPSY TEMPORAL ARTERY;  Surgeon: Kieth Brightly Arta Bruce, MD;  Location: [facility];  Service: General;  Laterality: Left;    Allergies  Allergies  Allergen Reactions   Iodine    Septra [Sulfamethoxazole-Trimethoprim]    Sulfa Antibiotics    Watermelon [Citrullus Vulgaris]      EKGs/Labs/Other Studies Reviewed:   The following studies were reviewed today: No recent imaging to review.  EKG:  EKG is ordered today. NSR rate 60bpm  Recent Labs: No results found for requested labs within last 365 days.  Recent Lipid Panel No results found for: "CHOL", "TRIG", "HDL", "CHOLHDL", "VLDL", "LDLCALC", "LDLDIRECT"  Home Medications   Current Meds  Medication Sig   aspirin 81 MG tablet Take 81 mg by mouth daily.    atenolol (TENORMIN) 25 MG tablet TAKE 1 TABLET BY MOUTH EVERY DAY.   calcium carbonate (TUMS - DOSED IN MG ELEMENTAL CALCIUM) 500 MG chewable tablet Chew 1 tablet by mouth as needed for indigestion or heartburn. Per patient taking every 2 days   isosorbide mononitrate (IMDUR) 60 MG 24 hr tablet TAKE 1 TABLET  BY MOUTH EVERY DAY   levothyroxine (SYNTHROID, LEVOTHROID) 88 MCG tablet Take 88 mcg by mouth daily before breakfast.   losartan (COZAAR) 50 MG tablet Take 50 mg by mouth daily.   Multiple Vitamin (MULTI VITAMIN DAILY) TABS Take 2 tablets by mouth daily.    potassium chloride SA (K-DUR) 20 MEQ tablet Take 1 tablet (20 mEq total) by mouth daily.   predniSONE (DELTASONE) 5 MG tablet Take 5 mg by mouth daily.   rosuvastatin (CRESTOR) 10 MG tablet Take 1 tablet by mouth 2 times a week.   [DISCONTINUED] b complex vitamins capsule Take 1 capsule by  mouth daily.   [DISCONTINUED] folic acid (FOLVITE) 1 MG tablet Take 1 mg by mouth daily.   [DISCONTINUED] lansoprazole (PREVACID) 30 MG capsule Take 30 mg by mouth daily as needed (HEARTBURN).    [DISCONTINUED] methotrexate (RHEUMATREX) 2.5 MG tablet Take 10 mg by mouth once a week. Caution:Chemotherapy. Protect from light. Take 5 tabs every friday   [DISCONTINUED] nitroGLYCERIN (NITROSTAT) 0.4 MG SL tablet Place 0.4 mg under the tongue every 5 (five) minutes as needed for chest pain.   [DISCONTINUED] Tocilizumab (ACTEMRA [place]) Take one (1) injection into the skin once weekly.   [DISCONTINUED] torsemide (DEMADEX) 20 MG tablet Take 1 tablet (20 mg total) by mouth daily.     Review of Systems      All other systems reviewed and are otherwise negative except as noted above.  Physical Exam    VS:  BP 132/72   Pulse 60   Ht _0  (1.6 m)   Wt 206 lb 3.2 oz (93.5 kg)   SpO2 98%   BMI 36.53 kg/m  , BMI Body mass index is 36.53 kg/m.  Wt Readings from Last 3 Encounters:  10/19/22 206 lb 3.2 oz (93.5 kg)  04/21/21 197 lb (89.4 kg)  05/09/19 205 lb (93 kg)     GEN: Well nourished, well developed, in no acute distress. HEENT: normal. Neck: Supple, no JVD, carotid bruits, or masses. Cardiac: RRR, no murmurs, rubs, or gallops. No clubbing, cyanosis, 3 + pitting edema.  Radials/PT 2+ and equal bilaterally.  Respiratory: Respirations regular and unlabored, clear to auscultation bilaterally. GI: Soft, nontender, nondistended. MS: No deformity or atrophy. Skin: Warm and dry, no rash. Neuro:  Strength and sensation are intact. Psych: Normal affect.  Assessment & Plan    Lower ext edema -she is not taking her diuretic so I have encouraged her to do so -elevate legs when able -compression, elastic therapy information provided.  -low sodium diet discussed  CAD -no chest pain -continue GDMT: atenolol, imdur, cozaar, potassium, and demadex  Essential hypertension -well  controlled -continue current medication regimen  Giant cell arteritis -continue prednisone   Renal insufficiency -follow-up labs in 2 weeks to check renal function        Disposition: Follow up 4 weeks with Sinclair Grooms, MD or APP.  Signed, Elgie Collard, PA-C 10/19/2022, 5:57 PM [facility]

## 2022-10-23 ENCOUNTER — Telehealth (HOSPITAL_COMMUNITY): Payer: Self-pay | Admitting: Physician Assistant

## 2022-10-23 ENCOUNTER — Telehealth: Payer: Self-pay | Admitting: Interventional Cardiology

## 2022-10-23 NOTE — Telephone Encounter (Signed)
Spoke with pt who states she is not happy as she just received a delivery from pharmacy for Torsemide which she advised the PA on her last visit that she did not need a refill of.  Pt states medication was $20 and cannot be returned.  Pt states she is not going to take medication due to side effects and she cancelled her follow up appointments.  She will await notification of MD that will be assigned to her following Dr Michaelle Copas retirement. Pt advised will forward message to PA to make her aware.  Pt thanked Charity fundraiser for the callback.

## 2022-10-23 NOTE — Telephone Encounter (Signed)
Pt c/o medication issue:  1. Name of Medication:   torsemide (DEMADEX) 20 MG tablet    2. How are you currently taking this medication (dosage and times per day)? Take 1 tablet (20 mg total) by mouth daily   3. Are you having a reaction (difficulty breathing--STAT)? no  4. What is your medication issue? Patient has questions about this medication. She states that it wasn't talked about in her last visit. Please advise

## 2022-10-23 NOTE — Telephone Encounter (Signed)
Patient called and cancelled echocardiogram for the reason below:  10/23/2022 1:00 PM [id number], SHAWANA A  Cancel Rsn: Patient (dont want to have done)  Order will be removed from the echo wq.

## 2022-10-23 NOTE — Telephone Encounter (Signed)
Will send to ordering Provider and covering as a general FYI.

## 2022-10-25 NOTE — Telephone Encounter (Signed)
Pt called back per Ms. Asa Lente PA-C's note below:  She did not mention any side effects during our visit and she was encouraged to take her diuretic due to the extensive amount of lower ext edema she was experiencing during out visit.  I have attached Dr. Katrinka Blazing on this message but we need to get her set up with a new primary. Preference would be Dr. Shari Prows or Dr. Izora Ribas.  Please arrange f/u with me ASAP.  Thanks Sharlene Dory, PA-C   Pt stated she wants to be referred to Dr. Izora Ribas as primary cardiologist.    Sherron Monday to Pt at great length about follow up appointment with Ms. Vertell Novak, and the importance of another appointment with [place], in regards to her bilateral lower extremity edema.  She wanted to apologize to Ms. Vertell Novak, but does NOT want another appointment with [facility].  Pt wants to elevate her legs, and stated she has old Furosemide script, 20mg  daily from her Endocrinologist, and may take these.  Pt encouraged not to do this, and either see [facility] or see her PCP, Dr. , and follow up with him.  Pt advised to call Dr. Adrian Prince office for an appointment, prior to taking her old Furosemide script.  Pt stated she would get an appointment with Dr. Rinaldo Cloud.  Pt educated in great detail about what Lasix do, how electrolytes are impacted, and why a provider needs to manage this.  Pt understood all education, but a follow up appointment with Evlyn Kanner still was not an option.   Pt advised, if she has any new or worsening symptoms, to call me at [facility].  If we are not open, Pt was advised to go to Urgent care or the nearest ER.  Pt understood cardiac symptom education.

## 2022-11-08 ENCOUNTER — Other Ambulatory Visit: Payer: Medicare Other

## 2022-11-17 ENCOUNTER — Other Ambulatory Visit (HOSPITAL_COMMUNITY): Payer: Medicare Other

## 2022-12-25 ENCOUNTER — Ambulatory Visit: Payer: Medicare Other | Admitting: Physician Assistant

## 2023-09-04 ENCOUNTER — Other Ambulatory Visit: Payer: Self-pay | Admitting: Physician Assistant

## 2023-09-11 ENCOUNTER — Telehealth: Payer: Self-pay | Admitting: Physician Assistant

## 2023-09-11 DIAGNOSIS — I10 Essential (primary) hypertension: Secondary | ICD-10-CM

## 2023-09-11 MED ORDER — ATENOLOL 25 MG PO TABS: ORAL_TABLET | ORAL | 0 refills | Status: DC

## 2023-09-11 NOTE — Telephone Encounter (Signed)
*  STAT* If patient is at the pharmacy, call can be transferred to refill team.   1. Which medications need to be refilled? (please list name of each medication and dose if known) atenolol (TENORMIN) 25 MG tablet   2. Which pharmacy/location (including street and city if local pharmacy) is medication to be sent to?  [facility] - [place], [place] - [address]      3. Do they need a 30 day or 90 day supply? 90 day    Pt is out of medication

## 2023-09-20 ENCOUNTER — Other Ambulatory Visit: Payer: Self-pay | Admitting: Physician Assistant

## 2023-09-20 DIAGNOSIS — I2510 Atherosclerotic heart disease of native coronary artery without angina pectoris: Secondary | ICD-10-CM

## 2023-10-14 ENCOUNTER — Ambulatory Visit
Admission: EM | Admit: 2023-10-14 | Discharge: 2023-10-14 | Disposition: A | Discharge status: Home / Self Care | Payer: Medicare Other

## 2023-10-14 DIAGNOSIS — R600 Localized edema: Secondary | ICD-10-CM | POA: Diagnosis not present

## 2023-10-14 NOTE — ED Notes (Signed)
Patient is being discharged from the Urgent Care and sent to the [facility] Emergency Department via private vehicle with family . Per Becky Augusta. NP, patient is in need of higher level of care due to fall and leg swelling. Patient is aware and verbalizes understanding of plan of care.  Vitals:   10/14/23 1115  BP: (!) 160/75  Pulse: 75  Resp: 16  Temp: 98.4 F (36.9 C)  SpO2: 96%

## 2023-10-14 NOTE — ED Triage Notes (Addendum)
Pt presents to UC d/t fall & bilateral leg edema x1 day. States L leg leaking, was seen by PCP had leg wrapped & was given triamcinolone w/o relief.

## 2023-10-14 NOTE — Discharge Instructions (Addendum)
Please go to the emergency department at [facility] to be evaluated for your thigh pain and increased leg swelling as there is concern for possible blood clot.  Please go now.

## 2023-10-14 NOTE — ED Provider Notes (Signed)
[facility]    CSN: [id number] Arrival date & time: 10/14/23  1049      History   Chief Complaint Chief Complaint  Patient presents with   Fall   Leg Swelling    HPI Jody Shaw is a 82 y.o. female.   HPI  82 year old female with a past medical history significant for essential hypertension, coronary artery disease, GERD, hyperlipidemia, hypothyroidism, and giant cell arteritis presents for evaluation of bilateral leg edema.  The leg edema has been going on for a significant length of time and has been managed by her primary care provider with compression wraps and topical triamcinolone.  The patient is unsure of what the cause of her leg edema is.  She is also been experiencing pain in her left thigh x 1 week.  The swelling has worsened over the last 1 to 2 months and she has been leaking a serous fluid.  She denies any chest pain or shortness of breath.  She lives alone and she is here with her grandson.  Past Medical History:  Diagnosis Date   Coronary artery disease    s/p rotational atherectomy to the LAD in 1996 // Nuc 9/13: no ischemia   Echocardiogram    Echo 9/19:  EF 55-60, normal wall motion, grade 1 diastolic dysfunction, MAC   GERD (gastroesophageal reflux disease)    Giant cell arteritis ([facility])    HLD (hyperlipidemia)    Hypothyroidism    Vision loss of left eye     Patient Active Problem List   Diagnosis Date Noted   Vision loss of left eye 07/31/2017   Difficulty swallowing 07/31/2017   Hypokalemia 07/31/2017   Giant cell arteritis ([facility]) 07/31/2017   GERD (gastroesophageal reflux disease)    Hypothyroidism    Essential hypertension 11/24/2014   CAD (coronary artery disease) 11/24/2014   Hyperlipidemia 11/24/2014    Past Surgical History:  Procedure Laterality Date   ARTERY BIOPSY Left 08/03/2017   Procedure: BIOPSY TEMPORAL ARTERY;  Surgeon: Sheliah Hatch De Blanch, MD;  Location: [facility];  Service: General;  Laterality: Left;    OB  History   No obstetric history on file.      Home Medications    Prior to Admission medications   Medication Sig Start Date End Date Taking? Authorizing Provider  aspirin 81 MG tablet Take 81 mg by mouth daily.    Yes [provider]  atenolol (TENORMIN) 25 MG tablet TAKE 1 TABLET BY MOUTH EVERY DAY. 09/11/23  Yes Conte, Tessa N, PA-C  calcium carbonate (TUMS - DOSED IN MG ELEMENTAL CALCIUM) 500 MG chewable tablet Chew 1 tablet by mouth as needed for indigestion or heartburn. Per patient taking every 2 days   Yes [provider]  isosorbide mononitrate (IMDUR) 60 MG 24 hr tablet TAKE 1 TABLET BY MOUTH EVERY DAY 09/20/23  Yes Asa Lente, Tessa N, PA-C  levothyroxine (SYNTHROID, LEVOTHROID) 88 MCG tablet Take 88 mcg by mouth daily before breakfast.   Yes [provider]  losartan (COZAAR) 50 MG tablet Take 50 mg by mouth daily. 04/26/18  Yes [provider]  Multiple Vitamin (MULTI VITAMIN DAILY) TABS Take 2 tablets by mouth daily.    Yes [provider]  nitroGLYCERIN (NITROSTAT) 0.4 MG SL tablet Place 1 tablet (0.4 mg total) under the tongue every 5 (five) minutes as needed for chest pain. 10/19/22  Yes Conte, Tessa N, PA-C  potassium chloride SA (K-DUR) 20 MEQ tablet Take 1 tablet (20 mEq total)  by mouth daily. 05/09/19  Yes Lyn Records, MD  predniSONE (DELTASONE) 5 MG tablet Take 5 mg by mouth daily. 09/29/22  Yes [provider]  rosuvastatin (CRESTOR) 10 MG tablet TAKE 1 TABLET BY MOUTH 2 TIMES A WEEK. 09/04/23  Yes Asa Lente, Tessa N, PA-C  torsemide (DEMADEX) 20 MG tablet Take 1 tablet (20 mg total) by mouth daily. 10/19/22  Yes Sharlene Dory, PA-C    Family History Family History  Problem Relation Age of Onset   Heart disease Mother    Heart disease Father     Social History Social History   Tobacco Use   Smoking status: Former   Smokeless tobacco: Never  Advertising account planner   Vaping status: Never Used  Substance Use Topics   Alcohol  use: No   Drug use: No     Allergies   Iodine, Septra [sulfamethoxazole-trimethoprim], Sulfa antibiotics, and Watermelon [citrullus vulgaris]   Review of Systems Review of Systems  Respiratory:  Negative for shortness of breath.   Cardiovascular:  Positive for leg swelling. Negative for chest pain.  Skin:  Positive for color change.     Physical Exam Triage Vital Signs ED Triage Vitals [10/14/23 1115]  Encounter Vitals Group     BP      Systolic BP Percentile      Diastolic BP Percentile      Pulse      Resp 16     Temp      Temp Source Oral     SpO2      Weight      Height      Head Circumference      Peak Flow      Pain Score      Pain Loc      Pain Education      Exclude from Growth Chart    No data found.  Updated Vital Signs BP (!) 160/75 (BP Location: Right Arm)   Pulse 75   Temp 98.4 F (36.9 C) (Oral)   Resp 16   Ht 5\' 4"  (1.626 m)   Wt 200 lb (90.7 kg)   SpO2 96%   BMI 34.33 kg/m   Visual Acuity Right Eye Distance:   Left Eye Distance:   Bilateral Distance:    Right Eye Near:   Left Eye Near:    Bilateral Near:     Physical Exam Vitals and nursing note reviewed.  Constitutional:      Appearance: Normal appearance. She is not ill-appearing.  HENT:     Head: Normocephalic and atraumatic.  Musculoskeletal:     Right lower leg: Edema present.     Left lower leg: Edema present.  Skin:    General: Skin is warm and dry.     Capillary Refill: Capillary refill takes less than 2 seconds.     Findings: Erythema present.  Neurological:     General: No focal deficit present.     Mental Status: She is alert and oriented to person, place, and time.      UC Treatments / Results  Labs (all labs ordered are listed, but only abnormal results are displayed) Labs Reviewed - No data to display  EKG   Radiology No results found.  Procedures Procedures (including critical care time)  Medications Ordered in UC Medications - No data to  display  Initial Impression / Assessment and Plan / UC Course  I have reviewed the triage vital signs and the nursing notes.  Pertinent  labs & imaging results that were available during my care of the patient were reviewed by me and considered in my medical decision making (see chart for details).   Patient is a pleasant, nontoxic-appearing 82 year old female presenting for evaluation of bilateral leg edema with left thigh pain as outlined in HPI above.    As you can see in images above, there is marked 3+ pitting edema to the level of the knees bilaterally with dried serous fluid on the anterior shin and clear serous fluid dripping off of both legs onto the floor.  DP and PT pulses are 2+.  The areas of erythema are slightly warm to touch and are concerning for possible cellulitis.  She does have tenderness with palpation of the inner right thigh.  She has marked limited mobility secondary to her edema.  Given the marked edema, patient's immobility, and thigh pain there is concern for possible DVT and I feel she should be evaluated in the emergency department.  She is unsure if she has lymphedema or if her leg edema is cardiac in nature.  Last note available in epic from cardiology is from 2023 which discussed a mild diastolic dysfunction on echocardiogram in 2019.  At that visit the patient reported that she stopped taking her diuretics as they were not helping with her lower extremity edema.  She started having edema after being treated with high-dose prednisone and methotrexate for giant cell arteritis.  Patient's grandson is also concerned about the patient living alone and feels that she needs placement.  I advised him to discuss this with the social worker in the emergency department at [facility] when she is being evaluated.  The grandson will transport the patient via POV to [facility] for further evaluation.   Final Clinical Impressions(s) / UC Diagnoses   Final diagnoses:   Bilateral lower extremity edema     Discharge Instructions      Please go to the emergency department at [facility] to be evaluated for your thigh pain and increased leg swelling as there is concern for possible blood clot.  Please go now.     ED Prescriptions   None    PDMP not reviewed this encounter.   Becky Augusta, NP 10/14/23 1153

## 2023-10-23 ENCOUNTER — Other Ambulatory Visit: Payer: Self-pay | Admitting: Physician Assistant

## 2023-10-23 DIAGNOSIS — I2510 Atherosclerotic heart disease of native coronary artery without angina pectoris: Secondary | ICD-10-CM

## 2023-11-02 NOTE — Progress Notes (Signed)
Cardiology Office Note:  .   Date:  11/09/2023  ID:  Jody Shaw, DOB May 16, 1941, MRN [id number] PCP: Adrian Prince, MD  [facility] Providers Cardiologist:  (Formerly Dr. Verdis Prime III), needs to be established with new cardiologist)  History of Present Illness: Marland Kitchen   Jody Shaw is a 82 y.o. female with history of hypertension, CAD, GERD, hyperlipidemia, hypothyroidism, giant cell arteritis, and chronic lower extremity edema.    The patient was seen in the ED on 10/14/2023, and was complaining of left thigh pain x 1 week.  There was concern for possible cellulitis.  The patient is stopped taking diuretics as she felt that was not helping with lower extremity edema.  She had been recently treated with high-dose prednisone and methotrexate for giant cell arteritis.  Her grandson elected to take her to [facility] emergency room as her physicians were located there.  She was to be evaluated for DVT.  She was ruled out for DVT and started on IV antibiotics.  Since being discharged the patient is being followed by Dr. Evlyn Kanner, and his PA who continue to wrap her legs.  Unfortunately she has been eating a lot of salty foods to include Gatorade, and prepare dinners and has gained approximately 8 pounds.  Her left leg is swollen and weeping.  Her daughter rewraps her legs a couple times a week.  She was to have a home health nurse come by and see her to also wrap her legs but she chose not to have them come.  She continues to grieve the death of her husband in 12-30-2022.  She admits to not taking very good care of herself since that time.  She is also experiencing a good bit of depression.  She has not yet expressed this to her primary care physician.  She denies chest pain or palpitations or trouble breathing.  She does have a bed that allows her to keep her legs elevated but she has not been doing so as much lately.  She is finished her courses of antibiotics.  ROS: As above  otherwise negative  Studies Reviewed: Marland Kitchen   EKG Interpretation Date/Time:  Friday November 09 2023 13:34:51 EST Ventricular Rate:  74 PR Interval:  156 QRS Duration:  118 QT Interval:  414 QTC Calculation: 459 R Axis:   -10  Text Interpretation: Normal sinus rhythm Incomplete right bundle branch block No previous ECGs available Confirmed by Joni Reining [phone]) on 11/09/2023 2:32:04 PM    Physical Exam:   VS:  BP 132/72 (BP Location: Left Arm, Patient Position: Sitting, Cuff Size: Normal)   Pulse 74   Ht 5\' 3"  (1.6 m)   Wt 214 lb 6.4 oz (97.3 kg)   SpO2 98%   BMI 37.98 kg/m    Wt Readings from Last 3 Encounters:  11/09/23 214 lb 6.4 oz (97.3 kg)  10/14/23 200 lb (90.7 kg)  10/19/22 206 lb 3.2 oz (93.5 kg)    GEN: Well nourished, well developed in no acute distress.  Easily tearful. NECK: No JVD; No carotid bruits CARDIAC: RRR, 2/6 systolic murmurs, rubs, gallops RESPIRATORY:  Clear to auscultation without rales, wheezing or rhonchi  ABDOMEN: Soft, non-tender, non-distended EXTREMITIES: Massive bilateral lower extremity edema, lymphedema, gauze wraps to both legs with redness noted underneath, weeping on the left.  No deformity   ASSESSMENT AND PLAN: .   Bilateral lymphedema: Unfortunately the patient has gained approximately 8 pounds since leaving the hospital, and is not  adhere to a low sodium diet.  She has been drinking Gatorade, soup, and eating processed foods or frozen dinners.  She does live alone but her daughter lives 30 minutes away and comes to see her twice a week.         I have advised that she take Lasix 20 mg twice a day for the next 2 days, keep her legs elevated and wrapped, and avoid salt.  She is given a "salty 6" leaflet to review which foods have salt.  I have advised her to talk with Dr. Evlyn Kanner about renewing home health nurse to come and see her and wrap her legs more often than twice a week especially with the weeping.  I am concerned about recurrence  of cellulitis with skin breaking due to the edema.  I have advised her if she begins to run a fever, having worsening edema, pain, she is to present to the emergency room for follow-up.  Do not wish her to have recurrence of cellulitis leading to osteomyelitis.  Possibly using compression devices and keeping legs elevated is much as possible to assist in venous drainage.   2.  Chronic diastolic CHF: May be contributing to but not principal cause of lymphedema.  She continues on daily Lasix 20 mg daily.  May need to increase her dose over time for better response.  Assisting with low-sodium diet would be helpful.    3.  Situational depression with grieving.  I have advised her to speak with Dr. Evlyn Kanner concerning this.  She may need to be placed on low-dose SSRI to help her with this.  She is advised to grieving is normal but she states that she is not taking very good care of her self.  It may be helpful for her to be placed on an antidepressant at the discretion of Dr. Evlyn Kanner.  I have asked her to speak with him about it on follow-up.  4.  Hypercholesterolemia: Remains on rosuvastatin 10 mg twice a week.  She is to have follow-up labs for primary care.  Goal of LDL less than 70 with known history of CAD.    5.  CAD: History of prior LAD rotational atherectomy.  Unknown date.  She denies any chest pain.  She remains on isosorbide mononitrate 60 mg daily.  If she becomes symptomatic may need to consider restarting her.  I have spent 45 minutes with this patient, and reviewing prior records from [facility] along with documentation. She will need to be established with new cardiologist on follow up. Leeroy Bock. Liborio Nixon, ANP, AACC

## 2023-11-09 ENCOUNTER — Encounter: Payer: Self-pay | Admitting: Adult Health

## 2023-11-09 ENCOUNTER — Ambulatory Visit: Payer: Medicare Other | Attending: Adult Health | Admitting: Adult Health

## 2023-11-09 VITALS — BP 132/72 | HR 74 | Ht 63.0 in | Wt 214.4 lb

## 2023-11-09 DIAGNOSIS — I10 Essential (primary) hypertension: Secondary | ICD-10-CM | POA: Diagnosis not present

## 2023-11-09 DIAGNOSIS — F4321 Adjustment disorder with depressed mood: Secondary | ICD-10-CM

## 2023-11-09 DIAGNOSIS — I890 Lymphedema, not elsewhere classified: Secondary | ICD-10-CM

## 2023-11-09 DIAGNOSIS — I2510 Atherosclerotic heart disease of native coronary artery without angina pectoris: Secondary | ICD-10-CM

## 2023-11-09 DIAGNOSIS — E7800 Pure hypercholesterolemia, unspecified: Secondary | ICD-10-CM

## 2023-11-09 DIAGNOSIS — I5032 Chronic diastolic (congestive) heart failure: Secondary | ICD-10-CM

## 2023-11-09 DIAGNOSIS — E669 Obesity, unspecified: Secondary | ICD-10-CM

## 2023-11-09 MED ORDER — ATENOLOL 25 MG PO TABS: ORAL_TABLET | ORAL | 3 refills | Status: DC

## 2023-11-09 MED ORDER — ROSUVASTATIN CALCIUM 10 MG PO TABS: ORAL_TABLET | ORAL | 3 refills | Status: DC

## 2023-11-09 MED ORDER — LOSARTAN POTASSIUM 50 MG PO TABS: 50.0000 mg | ORAL_TABLET | Freq: Every day | ORAL | 2 refills | Status: DC

## 2023-11-09 MED ORDER — ISOSORBIDE MONONITRATE ER 60 MG PO TB24: ORAL_TABLET | ORAL | 3 refills | Status: DC

## 2023-11-09 NOTE — Patient Instructions (Addendum)
Medication Instructions:  TAKE LASIX 40 MG (TWO TABLETS) TODAY AND TOMORROW.    Lab Work: NONE  Testing/Procedures: NONE   Follow-Up: At [facility], you and your health needs are our priority.  As part of our continuing mission to provide you with exceptional heart care, we have created designated Provider Care Teams.  These Care Teams include your primary Cardiologist (physician) and Advanced Practice Providers (APPs -  Physician Assistants and Nurse Practitioners) who all work together to provide you with the care you need, when you need it.   Your next appointment:   3 MONTHS  Provider:   Joni Reining   Other Instructions:

## 2023-12-28 LAB — LAB REPORT - SCANNED: EGFR: 39

## 2024-01-25 ENCOUNTER — Telehealth: Payer: Self-pay | Admitting: Physician Assistant

## 2024-01-25 LAB — LAB REPORT - SCANNED: EGFR: 13.3

## 2024-01-25 NOTE — Telephone Encounter (Signed)
 Dr Evlyn Kanner called and would like to speak to you about this patient at your convenience. Please call him at [phone].

## 2024-01-31 LAB — LAB REPORT - SCANNED: EGFR: 20.2

## 2024-02-07 ENCOUNTER — Ambulatory Visit: Payer: Medicare Other | Admitting: Adult Health

## 2024-02-12 NOTE — Telephone Encounter (Signed)
 Dr. Rinaldo Cloud office called in stating he has still not heard back about pt's worsening symptoms. She states he sent a message to [place], [place] and she will also send over some notes. I offered to go ahead and schedule something and she said she would have daughter c/b to schedule.

## 2024-02-12 NOTE — Telephone Encounter (Signed)
 Tried calling Dr. Rinaldo Cloud office back but their phone system has already been turned off for the day.    Was calling to inform Dr. Evlyn Kanner or RN that Jari Favre PA-C will be reaching out to them on Thursday 4/3 about this pt, when she returns to the office.

## 2024-02-15 LAB — LAB REPORT - SCANNED: EGFR: 33

## 2024-02-19 NOTE — Telephone Encounter (Signed)
 Sharlene Dory, PA-C  Loa Socks, LPN Caller: Unspecified (3 weeks ago) I have dicussed this patient with Dr. Evlyn Kanner. We will get her in with DOD or APP this week to help with fluid status and titration of medications. Will also need new labs (BMP, BNP).  Thanks! Sharlene Dory, PA-C   Returned a call back to the pt to arrange her an appt to see an APP or the DOD this week, as requested by Jari Favre PA-C.   Pt states she cannot come into the office until sometime towards the end of the month, for she states her daughter has to put in ample time in with her job, to drive her to and from doctors appts.  Pt states she also wants to wait and be seen when we move into the new building, for she states this is a lot easier for her to get into the office at [facility] location.  She is not interested on any other [facility] offices.  Ran this by Julian Hy and she okayed for the pt to wait until then, as long as her edema is under control and weights are stable.  Pt confirmed with me that her swelling is improved with the use of lasix and she states her weight currently taken at her last MD appt is 200 lbs.  Informed Jari Favre PA-C of this and she okayed for the pt to schedule her next follow-up appt with our office for the end of this month, but she should call us beforehand if swelling starts to worsen or if she has a 3lb weight gain in 24 hrs or 5 lb weight gain in a weeks time.  Endorsed this to the pt and she agreed to this plan.  Tried scheduling her for a follow-up appt for the end of this month and she states she will need to get with her daughter over the next day or so and ask when her availability is to bring her to a follow-up appt towards the end of this month.  Pt request that we call her back this Thursday and she will have the available days her daughter can bring her to an appt.  Pt aware I will have our scheduling dept reach out to her on this Thursday to arrange her an appt with our  office at the new building for the end of this month.  Pt verbalized understanding and agrees with this plan.  She will be on the listen out from our schedulers this Thursday.     Off note:  Pt is a former Dr. Katrinka Blazing pt and will need to establish with a new Cardiologist with our practice.  Will endorse this to our scheduling team.

## 2024-02-21 NOTE — Telephone Encounter (Signed)
 PLEASE CALL ON THURSDAY 4/10 TO ARRANGE FOLLOW-UP FOR END OF THIS MONTH Received: Today  [place], Lucie Leather, LPN Patient has been scheduled for 04/29 at 2:20pm with Dr. Odis Hollingshead

## 2024-02-28 LAB — LAB REPORT - SCANNED: EGFR: 26.7

## 2024-03-06 LAB — LAB REPORT - SCANNED: EGFR: 25.1

## 2024-03-11 ENCOUNTER — Encounter: Payer: Self-pay | Admitting: Cardiology

## 2024-03-11 ENCOUNTER — Ambulatory Visit: Attending: Cardiology | Admitting: Cardiology

## 2024-03-11 VITALS — BP 110/52 | HR 86 | Ht 62.0 in | Wt 205.0 lb

## 2024-03-11 DIAGNOSIS — I5031 Acute diastolic (congestive) heart failure: Secondary | ICD-10-CM | POA: Diagnosis not present

## 2024-03-11 DIAGNOSIS — I2510 Atherosclerotic heart disease of native coronary artery without angina pectoris: Secondary | ICD-10-CM | POA: Diagnosis not present

## 2024-03-11 DIAGNOSIS — N179 Acute kidney failure, unspecified: Secondary | ICD-10-CM

## 2024-03-11 DIAGNOSIS — E7800 Pure hypercholesterolemia, unspecified: Secondary | ICD-10-CM

## 2024-03-11 DIAGNOSIS — E669 Obesity, unspecified: Secondary | ICD-10-CM

## 2024-03-11 DIAGNOSIS — I890 Lymphedema, not elsewhere classified: Secondary | ICD-10-CM

## 2024-03-11 DIAGNOSIS — M316 Other giant cell arteritis: Secondary | ICD-10-CM

## 2024-03-11 DIAGNOSIS — I10 Essential (primary) hypertension: Secondary | ICD-10-CM

## 2024-03-11 MED ORDER — ISOSORBIDE MONONITRATE ER 30 MG PO TB24: ORAL_TABLET | ORAL | 3 refills | Status: DC

## 2024-03-11 MED ORDER — TORSEMIDE 20 MG PO TABS: 20.0000 mg | ORAL_TABLET | Freq: Every morning | ORAL | 3 refills | Status: DC

## 2024-03-11 NOTE — Progress Notes (Signed)
 Cardiology Office Note:  .   Date:  03/11/2024  ID:  EVY SERVIN, DOB [birth year], MRN [id number] PCP:  Rosslyn Coons, MD  Former Cardiology Providers: Dr. Lav[facility] Providers Cardiologist:  Olinda Bertrand, DO , [facility] (established care 03/11/24) Electrophysiologist:  None  Click to update primary MD,subspecialty MD or APP then REFRESH:1}    Chief Complaint  Patient presents with   Follow-up    Acute HFpEF and LE swelling    History of Present Illness: Jody Shaw   Jody Shaw is a 83 y.o. 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  Formally under the care of Dr. Felipe Horton who last saw Jody Shaw back in June 2022. I am seeing her for the first time to re-establishing care.   Patient last saw Lovette Rud in December 2023 at that time she is not taking her diuretics because she felt that it does not help her lower extremity swelling.  In the past patient was noted to have lower extremity swelling as well which was felt due to high doses of prednisone  as well as methotrexate which she was taking for giant cell arteritis.  Patient was also seen by Friddie Jetty in December 2024.  This visit was after being seen at [facility] for lower extremity swelling at that time DVT was ruled out and patient was started on IV antibiotics.  Her swelling was secondary to her not taking her diuretics, increased salty foods, fluid indiscretion, and TV dinners.  Patient was not taking care of herself to the best of her abilities as she was still grieving the loss of her husband who passed away in 2023-01-17.   Her PCP Dr. Jesse Moritz reached out to the office back in April 2025, earlier this month, as she was noted to be retaining volume and needed to be reevaluated.   Patient was offered an appointment but stated that she could not make it at that time as she had to coordinate her care with her daughter.  Patient was advised to get labs prior to today's office visit which she brought with her and were reviewed independently and mentioned below for reference.    Patient denies anginal chest pain at rest or with effort related activities.  She has lower extremity swelling which is overall improving over the last several weeks.  She denies orthopnea or PND.  Her overall functional capacity is limited due to bilateral lymphedema.  She is currently in a wheelchair.  Her medication list includes both Lasix  20 mg p.o. daily as well as torsemide  20 mg p.o. daily.  Patient states that she is only taking Lasix .  For short period of time she was asked to take Lasix  40 mg p.o. daily but follow-up labs noted worsening renal function and therefore the medication was dropped down to 20 mg.  Patient has made a significant improvement with regards to reducing the consumption of salt, TV dinners, and not drinking excessive amounts of fluids.   Review of Systems: .   Review of Systems  Cardiovascular:  Positive for dyspnea on exertion and leg swelling (chronic LE lymphema at baseline). Negative for chest pain, claudication, irregular heartbeat, near-syncope, orthopnea, palpitations, paroxysmal nocturnal dyspnea and syncope.  Respiratory:  Negative for shortness of breath.   Hematologic/Lymphatic: Negative for bleeding problem.  Studies Reviewed:   EKG: 10/2023: Sinus rhythm, 74 bpm, incomplete right bundle branch block.  Echocardiogram: September 2019: LVEF 55 to 60%, grade 1 diastolic dysfunction, see report for additional details  RADIOLOGY: NA  Risk Assessment/Calculations:   NA   Labs:    External Labs: Results were provided by the patient and drawn at her PCPs office Collected: December 28, 2023 Sodium 140, potassium 4.5, chloride 107, bicarb 24, BUN 30,  creatinine 1.3  Collected: January 25, 2024 Sodium 132, potassium 5.6, chloride 102, bicarb 18, BUN 100, creatinine 3.3  Collected January 31, 2024 Sodium 135, potassium 4.6, chloride 106, bicarb 15, BUN 90, creatinine 2.3  Collected February 15, 2024 Sodium 138, potassium 4.2, chloride 104, bicarb 24, BUN 44, creatinine 1.5  Collected February 28, 2024 Sodium 131, potassium 5.2, chloride 100, bicarb 21, BUN 53, creatinine 1.8  Collected March 06, 2024 Sodium 133, potassium 4.9, chloride 105, bicarb 22, BUN 53, creatinine 1.9  Physical Exam:    Today's Vitals   03/11/24 1410  BP: (!) 110/52  Pulse: 86  SpO2: 99%  Weight: 205 lb (93 kg)  Height: 5\' 2"  (1.575 m)   Body mass index is 37.49 kg/m. Wt Readings from Last 3 Encounters:  03/11/24 205 lb (93 kg)  11/09/23 214 lb 6.4 oz (97.3 kg)  10/14/23 200 lb (90.7 kg)    Physical Exam  Constitutional: No distress. She appears chronically ill.  hemodynamically stable, in wheelchair  Neck: No JVD present.  Cardiovascular: Normal rate, regular rhythm, S1 normal and S2 normal. Exam reveals no gallop, no S3 and no S4.  No murmur heard. Pulmonary/Chest: Effort normal and breath sounds normal. No stridor. She has no wheezes. She has no rales.  Abdominal: Soft. She exhibits no distension. There is no abdominal tenderness.  Musculoskeletal:        General: Edema (+2 up to the knee bilaterally, LE ext. wrapped., warm, moist.) present.     Cervical back: Neck supple.  Neurological: She is alert and oriented to person, place, and time.  Skin: Skin is warm.     Impression & Recommendation(s):  Impression:   ICD-10-CM   1. Coronary artery disease involving native coronary artery of native heart without angina pectoris  I25.10 isosorbide  mononitrate (IMDUR ) 30 MG 24 hr tablet    2. Lymphedema associated with obesity  I89.0    E66.9     3. 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 AKI (acute kidney injury) (HCC)  N17.9     5. Essential hypertension  I10 Basic metabolic panel with GFR    Pro b natriuretic peptide (BNP)    6. Hypercholesterolemia  E78.00     7. Giant cell arteritis (HCC)  M31.6        Recommendation(s):  Coronary artery disease involving native coronary artery of native heart without angina pectoris Denies anginal chest pain. Continue aspirin  and statin therapy  Lymphedema associated with obesity Chronic, improving Follows up with PCP on a weekly basis to have her legs wrapped. Swelling extends to the bilateral knees.  Skin is warm to touch, mildly erythematous, tender to touch. Majority the legs are wrapped unable to evaluate for signs for infection.  But patient denies fevers or chills  Acute heart failure with preserved ejection fraction (HFpEF) (HCC) Stage C, NYHA class II/III Improving Discontinue Lasix  Start torsemide   20 mg p.o. daily BMP in one week to check renal function and electrolytes Echo will be ordered to evaluate for structural heart disease and left ventricular systolic function. Currently takes: Atenolol  25 mg p.o. every afternoon. Imdur  60 mg p.o. every afternoon. Losartan  50 mg p.o. every morning. Lasix  20 mg p.o. every morning  Now recommending the following: Atenolol  25 mg p.o. every morning. Discontinue Lasix . Reduce Imdur  to 30 mg p.o. every afternoon Change losartan  to 50 mg p.o. every afternoon Start Demadex  20 mg p.o. every morning  Advised the patient to check her weights daily.  To call us  back or PCP if she gains more than 1 pound over 24 hours or 3 pounds in a week.  Reemphasized importance of low-salt diet.  AKI (acute kidney injury) (HCC) Secondary to diuresis. Will need to have follow-up function. Avoid nephrotoxic agents  Essential hypertension Office blood pressures are acceptable. Medication changes as mentioned  above  Hypercholesterolemia Continue rosuvastatin  10 mg p.o. twice weekly Cardiology following peripherally, managed by primary care provider.  Giant cell arteritis (HCC) Remains on prednisone  5 mg p.o. daily   Orders Placed:  Orders Placed This Encounter  Procedures   Basic metabolic panel with GFR    Standing Status:   Future    Expected Date:   03/18/2024    Expiration Date:   03/11/2025   Pro b natriuretic peptide (BNP)    Standing Status:   Future    Expected Date:   03/18/2024    Expiration Date:   03/11/2025   ECHOCARDIOGRAM COMPLETE    Standing Status:   Future    Expected Date:   03/18/2024    Expiration Date:   03/11/2025    Where [facility] performed[facility]    Does the patient weigh less than or greater than 250 lbs?:   Patient weighs less than 250 lbs    Perflutren DEFINITY (image enhancing agent) should be administered unless hypersensitivity or allergy exist:   Administer Perflutren    Reason for exam-Echo:   Other-Full Diagnosis List    Full ICD-10/Reason for Exam:   HF (heart failure), diastolic (HCC) [119147]     Final Medication List:    Meds ordered this encounter  Medications   torsemide  (DEMADEX ) 20 MG tablet    Sig: Take 1 tablet (20 mg total) by mouth in the morning.    Dispense:  30 tablet    Refill:  3   isosorbide  mononitrate (IMDUR ) 30 MG 24 hr tablet    Sig: TAKE 1 TABLET BY MOUTH EVERY EVENING    Dispense:  30 tablet    Refill:  3    Decreased to 30 mg once daily    Medications Discontinued During This Encounter  Medication Reason   furosemide  (LASIX ) 20 MG tablet Discontinued by provider   torsemide  (DEMADEX ) 20 MG tablet Duplicate   isosorbide  mononitrate (IMDUR ) 60 MG 24 hr tablet      Current Outpatient Medications:    aspirin  81 MG tablet, Take 81 mg by mouth as needed., Disp: , Rfl:    atenolol  (TENORMIN ) 25 MG tablet, TAKE 1 TABLET BY MOUTH EVERY DAY., Disp: 90 tablet, Rfl: 3   calcium  carbonate (TUMS -  DOSED IN MG ELEMENTAL CALCIUM ) 500 MG chewable tablet, Chew 1 tablet by mouth as needed for indigestion or heartburn. Per patient taking every 2 days, Disp: , Rfl:    ferrous sulfate 324 MG TBEC, Take 324 mg by  mouth daily., Disp: , Rfl:    levothyroxine  (SYNTHROID , LEVOTHROID) 88 MCG tablet, Take 88 mcg by mouth daily before breakfast., Disp: , Rfl:    losartan  (COZAAR ) 50 MG tablet, Take 1 tablet (50 mg total) by mouth daily., Disp: 90 tablet, Rfl: 2   Multiple Vitamin (MULTI VITAMIN DAILY) TABS, Take 2 tablets by mouth daily. , Disp: , Rfl:    nitroGLYCERIN  (NITROSTAT ) 0.4 MG SL tablet, Place 1 tablet (0.4 mg total) under the tongue every 5 (five) minutes as needed for chest pain., Disp: 25 tablet, Rfl: 3   predniSONE  (DELTASONE ) 5 MG tablet, Take 5 mg by mouth daily., Disp: , Rfl:    rosuvastatin  (CRESTOR ) 10 MG tablet, Take 1 tablet by mouth 2 times a week., Disp: 90 tablet, Rfl: 3   torsemide  (DEMADEX ) 20 MG tablet, Take 1 tablet (20 mg total) by mouth in the morning., Disp: 30 tablet, Rfl: 3   triamcinolone cream (KENALOG) 0.1 %, Apply topically as needed., Disp: , Rfl:    isosorbide  mononitrate (IMDUR ) 30 MG 24 hr tablet, TAKE 1 TABLET BY MOUTH EVERY EVENING, Disp: 30 tablet, Rfl: 3   potassium chloride  SA (K-DUR) 20 MEQ tablet, Take 1 tablet (20 mEq total) by mouth daily. (Patient not taking: Reported on 03/11/2024), Disp: 90 tablet, Rfl: 1  Consent:   NA  Disposition:   78-month follow-up sooner if needed  Her questions and concerns were addressed to her satisfaction. She voices understanding of the recommendations provided during this encounter.    Signed, Olinda Bertrand, DO, [facility]  [facility]  [address] [place], [place] [address] 03/11/2024 2:59 PM

## 2024-03-11 NOTE — Patient Instructions (Signed)
 Medication Instructions:  Your physician has recommended you make the following change in your medication:   STOP Furosemide  (Lasix )  START Torsemide  20 mg once daily in the morning  DECREASE Imdur  to 30 mg once daily in the evening   **MORNING medications: Atenolol , Torsemide  **EVENING medications: Losartan , Imdur     *If you need a refill on your cardiac medications before your next appointment, please call your pharmacy*  Lab Work: To be completed in 1 week: BMP and Pro-BNP  If you have labs (blood work) drawn today and your tests are completely normal, you will receive your results only by: MyChart Message (if you have MyChart) OR A paper copy in the mail If you have any lab test that is abnormal or we need to change your treatment, we will call you to review the results.  Testing/Procedures: Your physician has requested that you have an echocardiogram. Echocardiography is a painless test that uses sound waves to create images of your heart. It provides your doctor with information about the size and shape of your heart and how well your heart's chambers and valves are working. This procedure takes approximately one hour. There are no restrictions for this procedure. Please do NOT wear cologne, perfume, aftershave, or lotions (deodorant is allowed). Please arrive 15 minutes prior to your appointment time.  Please note: We ask at that you not bring children with you during ultrasound (echo/ vascular) testing. Due to room size and safety concerns, children are not allowed in the ultrasound rooms during exams. Our front office staff cannot provide observation of children in our lobby area while testing is being conducted. An adult accompanying a patient to their appointment will only be allowed in the ultrasound room at the discretion of the ultrasound technician under special circumstances. We apologize for any inconvenience.   Follow-Up: At [facility], you and your health needs  are our priority.  As part of our continuing mission to provide you with exceptional heart care, we have created designated Provider Care Teams.  These Care Teams include your primary Cardiologist (physician) and Advanced Practice Providers (APPs -  Physician Assistants and Nurse Practitioners) who all work together to provide you with the care you need, when you need it.  We recommend signing up for the patient portal called "MyChart".  Sign up information is provided on this After Visit Summary.  MyChart is used to connect with patients for Virtual Visits (Telemedicine).  Patients are able to view lab/test results, encounter notes, upcoming appointments, etc.  Non-urgent messages can be sent to your provider as well.   To learn more about what you can do with MyChart, go to [email or web address].    Your next appointment:   6 month(s)  The format for your next appointment:   In Person  Provider:   Olinda Bertrand, [facility]  Other Instructions If lab work is completed at primary care provider office, please have a copy sent to our office (fax: [phone]). If the primary care office doesn't hear back from us , we did not get the copy.

## 2024-03-17 ENCOUNTER — Encounter: Payer: Self-pay | Admitting: Endocrinology

## 2024-03-24 ENCOUNTER — Other Ambulatory Visit: Payer: Self-pay

## 2024-03-24 ENCOUNTER — Inpatient Hospital Stay (HOSPITAL_COMMUNITY)

## 2024-03-24 ENCOUNTER — Emergency Department (HOSPITAL_COMMUNITY)

## 2024-03-24 ENCOUNTER — Inpatient Hospital Stay (HOSPITAL_COMMUNITY)
Admission: EM | Admit: 2024-03-24 | Discharge: 2024-03-29 | DRG: 683 | Disposition: A | Discharge status: Home Health | Attending: Family Medicine | Admitting: Family Medicine

## 2024-03-24 DIAGNOSIS — E2740 Unspecified adrenocortical insufficiency: Secondary | ICD-10-CM | POA: Diagnosis present

## 2024-03-24 DIAGNOSIS — E785 Hyperlipidemia, unspecified: Secondary | ICD-10-CM | POA: Diagnosis present

## 2024-03-24 DIAGNOSIS — E875 Hyperkalemia: Secondary | ICD-10-CM | POA: Diagnosis present

## 2024-03-24 DIAGNOSIS — W19XXXA Unspecified fall, initial encounter: Secondary | ICD-10-CM | POA: Diagnosis present

## 2024-03-24 DIAGNOSIS — M316 Other giant cell arteritis: Secondary | ICD-10-CM | POA: Diagnosis present

## 2024-03-24 DIAGNOSIS — D631 Anemia in chronic kidney disease: Secondary | ICD-10-CM | POA: Diagnosis present

## 2024-03-24 DIAGNOSIS — Z6834 Body mass index (BMI) 34.0-34.9, adult: Secondary | ICD-10-CM

## 2024-03-24 DIAGNOSIS — I129 Hypertensive chronic kidney disease with stage 1 through stage 4 chronic kidney disease, or unspecified chronic kidney disease: Secondary | ICD-10-CM | POA: Diagnosis present

## 2024-03-24 DIAGNOSIS — I2510 Atherosclerotic heart disease of native coronary artery without angina pectoris: Secondary | ICD-10-CM | POA: Diagnosis present

## 2024-03-24 DIAGNOSIS — Z882 Allergy status to sulfonamides status: Secondary | ICD-10-CM

## 2024-03-24 DIAGNOSIS — L03119 Cellulitis of unspecified part of limb: Secondary | ICD-10-CM

## 2024-03-24 DIAGNOSIS — H5462 Unqualified visual loss, left eye, normal vision right eye: Secondary | ICD-10-CM | POA: Diagnosis present

## 2024-03-24 DIAGNOSIS — Z79899 Other long term (current) drug therapy: Secondary | ICD-10-CM

## 2024-03-24 DIAGNOSIS — D72829 Elevated white blood cell count, unspecified: Secondary | ICD-10-CM

## 2024-03-24 DIAGNOSIS — Z66 Do not resuscitate: Secondary | ICD-10-CM | POA: Diagnosis present

## 2024-03-24 DIAGNOSIS — Y92009 Unspecified place in unspecified non-institutional (private) residence as the place of occurrence of the external cause: Secondary | ICD-10-CM | POA: Diagnosis not present

## 2024-03-24 DIAGNOSIS — I890 Lymphedema, not elsewhere classified: Secondary | ICD-10-CM

## 2024-03-24 DIAGNOSIS — R062 Wheezing: Secondary | ICD-10-CM | POA: Diagnosis not present

## 2024-03-24 DIAGNOSIS — Z8249 Family history of ischemic heart disease and other diseases of the circulatory system: Secondary | ICD-10-CM

## 2024-03-24 DIAGNOSIS — E669 Obesity, unspecified: Secondary | ICD-10-CM | POA: Diagnosis present

## 2024-03-24 DIAGNOSIS — F419 Anxiety disorder, unspecified: Secondary | ICD-10-CM | POA: Diagnosis present

## 2024-03-24 DIAGNOSIS — B9620 Unspecified Escherichia coli [E. coli] as the cause of diseases classified elsewhere: Secondary | ICD-10-CM | POA: Diagnosis present

## 2024-03-24 DIAGNOSIS — L03115 Cellulitis of right lower limb: Secondary | ICD-10-CM | POA: Diagnosis present

## 2024-03-24 DIAGNOSIS — E039 Hypothyroidism, unspecified: Secondary | ICD-10-CM | POA: Diagnosis present

## 2024-03-24 DIAGNOSIS — Z7982 Long term (current) use of aspirin: Secondary | ICD-10-CM

## 2024-03-24 DIAGNOSIS — E66811 Obesity, class 1: Secondary | ICD-10-CM | POA: Diagnosis present

## 2024-03-24 DIAGNOSIS — N179 Acute kidney failure, unspecified: Principal | ICD-10-CM | POA: Diagnosis present

## 2024-03-24 DIAGNOSIS — E860 Dehydration: Secondary | ICD-10-CM | POA: Diagnosis present

## 2024-03-24 DIAGNOSIS — Z91018 Allergy to other foods: Secondary | ICD-10-CM

## 2024-03-24 DIAGNOSIS — T502X5A Adverse effect of carbonic-anhydrase inhibitors, benzothiadiazides and other diuretics, initial encounter: Secondary | ICD-10-CM | POA: Diagnosis present

## 2024-03-24 DIAGNOSIS — H9190 Unspecified hearing loss, unspecified ear: Secondary | ICD-10-CM | POA: Diagnosis present

## 2024-03-24 DIAGNOSIS — K219 Gastro-esophageal reflux disease without esophagitis: Secondary | ICD-10-CM | POA: Diagnosis present

## 2024-03-24 DIAGNOSIS — I959 Hypotension, unspecified: Secondary | ICD-10-CM | POA: Diagnosis present

## 2024-03-24 DIAGNOSIS — S0990XA Unspecified injury of head, initial encounter: Secondary | ICD-10-CM | POA: Diagnosis present

## 2024-03-24 DIAGNOSIS — Z7952 Long term (current) use of systemic steroids: Secondary | ICD-10-CM

## 2024-03-24 DIAGNOSIS — E871 Hypo-osmolality and hyponatremia: Secondary | ICD-10-CM | POA: Diagnosis present

## 2024-03-24 DIAGNOSIS — Z79890 Hormone replacement therapy (postmenopausal): Secondary | ICD-10-CM

## 2024-03-24 DIAGNOSIS — N1831 Chronic kidney disease, stage 3a: Secondary | ICD-10-CM | POA: Diagnosis present

## 2024-03-24 DIAGNOSIS — Z1152 Encounter for screening for COVID-19: Secondary | ICD-10-CM

## 2024-03-24 DIAGNOSIS — Z888 Allergy status to other drugs, medicaments and biological substances status: Secondary | ICD-10-CM

## 2024-03-24 DIAGNOSIS — L03116 Cellulitis of left lower limb: Secondary | ICD-10-CM | POA: Diagnosis present

## 2024-03-24 DIAGNOSIS — E1122 Type 2 diabetes mellitus with diabetic chronic kidney disease: Secondary | ICD-10-CM | POA: Diagnosis present

## 2024-03-24 DIAGNOSIS — N3001 Acute cystitis with hematuria: Secondary | ICD-10-CM | POA: Diagnosis present

## 2024-03-24 DIAGNOSIS — I70229 Atherosclerosis of native arteries of extremities with rest pain, unspecified extremity: Secondary | ICD-10-CM | POA: Diagnosis not present

## 2024-03-24 DIAGNOSIS — Z87891 Personal history of nicotine dependence: Secondary | ICD-10-CM

## 2024-03-24 DIAGNOSIS — E872 Acidosis, unspecified: Secondary | ICD-10-CM | POA: Diagnosis present

## 2024-03-24 LAB — PROTEIN / CREATININE RATIO, URINE
Creatinine, Urine: 118 mg/dL
Protein Creatinine Ratio: 0.42 mg/mg{creat} — ABNORMAL HIGH (ref 0.00–0.15)
Total Protein, Urine: 50 mg/dL

## 2024-03-24 LAB — RESP PANEL BY RT-PCR (RSV, FLU A&B, COVID)  RVPGX2
Influenza A by PCR: NEGATIVE
Influenza B by PCR: NEGATIVE
Resp Syncytial Virus by PCR: NEGATIVE
SARS Coronavirus 2 by RT PCR: NEGATIVE

## 2024-03-24 LAB — COMPREHENSIVE METABOLIC PANEL WITH GFR
ALT: 20 U/L (ref 0–44)
AST: 30 U/L (ref 15–41)
Albumin: 2.7 g/dL — ABNORMAL LOW (ref 3.5–5.0)
Alkaline Phosphatase: 66 U/L (ref 38–126)
Anion gap: 18 — ABNORMAL HIGH (ref 5–15)
BUN: 169 mg/dL — ABNORMAL HIGH (ref 8–23)
CO2: 11 mmol/L — ABNORMAL LOW (ref 22–32)
Calcium: 8.3 mg/dL — ABNORMAL LOW (ref 8.9–10.3)
Chloride: 102 mmol/L (ref 98–111)
Creatinine, Ser: 4.48 mg/dL — ABNORMAL HIGH (ref 0.44–1.00)
GFR, Estimated: 9 mL/min — ABNORMAL LOW (ref >60)
Glucose, Bld: 109 mg/dL — ABNORMAL HIGH (ref 70–99)
Potassium: 5.5 mmol/L — ABNORMAL HIGH (ref 3.5–5.1)
Sodium: 131 mmol/L — ABNORMAL LOW (ref 135–145)
Total Bilirubin: 0.9 mg/dL (ref 0.0–1.2)
Total Protein: 7.6 g/dL (ref 6.5–8.1)

## 2024-03-24 LAB — URINALYSIS, W/ REFLEX TO CULTURE (INFECTION SUSPECTED)
Bilirubin Urine: NEGATIVE
Glucose, UA: NEGATIVE mg/dL
Ketones, ur: NEGATIVE mg/dL
Nitrite: NEGATIVE
Protein, ur: NEGATIVE mg/dL
Specific Gravity, Urine: 1.012 (ref 1.005–1.030)
pH: 5 (ref 5.0–8.0)

## 2024-03-24 LAB — CBC WITH DIFFERENTIAL/PLATELET
Abs Immature Granulocytes: 0.46 10*3/uL — ABNORMAL HIGH (ref 0.00–0.07)
Basophils Absolute: 0 10*3/uL (ref 0.0–0.1)
Basophils Relative: 0 %
Eosinophils Absolute: 0 10*3/uL (ref 0.0–0.5)
Eosinophils Relative: 0 %
HCT: 28.8 % — ABNORMAL LOW (ref 36.0–46.0)
Hemoglobin: 9.6 g/dL — ABNORMAL LOW (ref 12.0–15.0)
Immature Granulocytes: 2 %
Lymphocytes Relative: 1 %
Lymphs Abs: 0.2 10*3/uL — ABNORMAL LOW (ref 0.7–4.0)
MCH: 28.7 pg (ref 26.0–34.0)
MCHC: 33.3 g/dL (ref 30.0–36.0)
MCV: 86.2 fL (ref 80.0–100.0)
Monocytes Absolute: 2.1 10*3/uL — ABNORMAL HIGH (ref 0.1–1.0)
Monocytes Relative: 11 %
Neutro Abs: 16.9 10*3/uL — ABNORMAL HIGH (ref 1.7–7.7)
Neutrophils Relative %: 86 %
Platelets: 345 10*3/uL (ref 150–400)
RBC: 3.34 MIL/uL — ABNORMAL LOW (ref 3.87–5.11)
RDW: 16.5 % — ABNORMAL HIGH (ref 11.5–15.5)
WBC: 19.7 10*3/uL — ABNORMAL HIGH (ref 4.0–10.5)
nRBC: 0 % (ref 0.0–0.2)

## 2024-03-24 LAB — SODIUM, URINE, RANDOM: Sodium, Ur: <10 mmol/L

## 2024-03-24 LAB — CREATININE, URINE, RANDOM: Creatinine, Urine: 123 mg/dL

## 2024-03-24 MED ORDER — SODIUM CHLORIDE 0.9 % IV SOLN
500.0000 mg | INTRAVENOUS | Status: DC
  Administered 2024-03-24: 500 mg via INTRAVENOUS
  Filled 2024-03-24: qty 5

## 2024-03-24 MED ORDER — PREDNISONE 5 MG PO TABS: 5.0000 mg | ORAL_TABLET | Freq: Every day | ORAL | Status: DC

## 2024-03-24 MED ORDER — SODIUM CHLORIDE 0.9 % IV BOLUS
1000.0000 mL | Freq: Once | INTRAVENOUS | Status: DC
Start: 2024-03-24 — End: 2024-03-24

## 2024-03-24 MED ORDER — SODIUM CHLORIDE 0.9 % IV SOLN
Freq: Once | INTRAVENOUS | Status: AC
Start: 2024-03-24 — End: 2024-03-25

## 2024-03-24 MED ORDER — LEVOTHYROXINE SODIUM 88 MCG PO TABS
88.0000 ug | ORAL_TABLET | Freq: Every day | ORAL | Status: DC
  Administered 2024-03-25 – 2024-03-29 (×5): 88 ug via ORAL
  Filled 2024-03-24 (×5): qty 1

## 2024-03-24 MED ORDER — VANCOMYCIN HCL 2000 MG/400ML IV SOLN
2000.0000 mg | Freq: Once | INTRAVENOUS | Status: AC
  Administered 2024-03-24: 2000 mg via INTRAVENOUS
  Filled 2024-03-24: qty 400

## 2024-03-24 MED ORDER — ASPIRIN 81 MG PO TBEC
81.0000 mg | DELAYED_RELEASE_TABLET | Freq: Every day | ORAL | Status: DC
  Administered 2024-03-24 – 2024-03-29 (×6): 81 mg via ORAL
  Filled 2024-03-24 (×6): qty 1

## 2024-03-24 MED ORDER — ACETAMINOPHEN 650 MG RE SUPP: 650.0000 mg | Freq: Four times a day (QID) | RECTAL | Status: DC | PRN

## 2024-03-24 MED ORDER — SODIUM CHLORIDE 0.9 % IV SOLN
1.0000 g | INTRAVENOUS | Status: DC
  Administered 2024-03-25 (×2): 1 g via INTRAVENOUS
  Filled 2024-03-24 (×3): qty 10

## 2024-03-24 MED ORDER — LACTATED RINGERS IV BOLUS: 1000.0000 mL | Freq: Once | INTRAVENOUS | Status: DC

## 2024-03-24 MED ORDER — POLYETHYLENE GLYCOL 3350 17 G PO PACK
17.0000 g | PACK | Freq: Every day | ORAL | Status: DC | PRN
  Filled 2024-03-24: qty 1

## 2024-03-24 MED ORDER — ROSUVASTATIN CALCIUM 10 MG PO TABS
10.0000 mg | ORAL_TABLET | ORAL | Status: DC
  Administered 2024-03-25 – 2024-03-28 (×2): 10 mg via ORAL
  Filled 2024-03-24 (×2): qty 1

## 2024-03-24 MED ORDER — SODIUM CHLORIDE (HYPERTONIC) 5 % OP SOLN: 1.0000 [drp] | OPHTHALMIC | Status: DC | PRN

## 2024-03-24 MED ORDER — CALCIUM CARBONATE ANTACID 500 MG PO CHEW: 1.0000 | CHEWABLE_TABLET | Freq: Two times a day (BID) | ORAL | Status: DC | PRN

## 2024-03-24 MED ORDER — LACTATED RINGERS IV SOLN: INTRAVENOUS | Status: AC

## 2024-03-24 MED ORDER — TRAMADOL HCL 50 MG PO TABS
25.0000 mg | ORAL_TABLET | Freq: Once | ORAL | Status: AC
  Administered 2024-03-24: 25 mg via ORAL
  Filled 2024-03-24: qty 1

## 2024-03-24 MED ORDER — ACETAMINOPHEN 325 MG PO TABS
650.0000 mg | ORAL_TABLET | Freq: Once | ORAL | Status: AC
  Administered 2024-03-24: 650 mg via ORAL
  Filled 2024-03-24: qty 2

## 2024-03-24 MED ORDER — ACETAMINOPHEN 325 MG PO TABS
650.0000 mg | ORAL_TABLET | Freq: Four times a day (QID) | ORAL | Status: DC | PRN
  Administered 2024-03-25: 650 mg via ORAL
  Filled 2024-03-24: qty 2

## 2024-03-24 MED ORDER — HEPARIN SODIUM (PORCINE) 5000 UNIT/ML IJ SOLN
5000.0000 [IU] | Freq: Three times a day (TID) | INTRAMUSCULAR | Status: DC
  Administered 2024-03-25 – 2024-03-29 (×13): 5000 [IU] via SUBCUTANEOUS
  Filled 2024-03-24 (×13): qty 1

## 2024-03-24 MED ORDER — SERTRALINE HCL 25 MG PO TABS
25.0000 mg | ORAL_TABLET | Freq: Every day | ORAL | Status: DC
  Administered 2024-03-24 – 2024-03-29 (×6): 25 mg via ORAL
  Filled 2024-03-24 (×7): qty 1

## 2024-03-24 MED ORDER — SODIUM CHLORIDE 0.9% FLUSH
3.0000 mL | Freq: Two times a day (BID) | INTRAVENOUS | Status: DC
  Administered 2024-03-25 – 2024-03-29 (×7): 3 mL via INTRAVENOUS

## 2024-03-24 MED ORDER — HYDROCORTISONE SOD SUC (PF) 100 MG IJ SOLR
50.0000 mg | Freq: Three times a day (TID) | INTRAMUSCULAR | Status: DC
  Administered 2024-03-25: 50 mg via INTRAVENOUS
  Filled 2024-03-24 (×2): qty 1

## 2024-03-24 NOTE — Assessment & Plan Note (Addendum)
 Hold atenolol  due to soft BP as above. Hold imdur . Hold losartan 

## 2024-03-24 NOTE — Progress Notes (Signed)
 ED Pharmacy Antibiotic Sign Off An antibiotic consult was received from an ED provider for vancomycin per pharmacy dosing for cellulitis. A chart review was completed to assess appropriateness.   The following one time order(s) were placed:  Vancomycin 2000 mg  Further antibiotic and/or antibiotic pharmacy consults should be ordered by the admitting provider if indicated.   Thank you for allowing pharmacy to be a part of this patient's care.   Lolita Rise, PharmD, BCPS Clinical Pharmacist 03/24/2024 3:43 PM

## 2024-03-24 NOTE — Assessment & Plan Note (Signed)
 This is chronic which has caused a blindness of the patient's left eye.  Patient is currently on chronic prednisone  accordingly.  Given patient's soft blood pressure at this time I will give her stress dose steroids tonight.

## 2024-03-24 NOTE — ED Notes (Signed)
 Pt reports some pain relief with tylenol .

## 2024-03-24 NOTE — Assessment & Plan Note (Signed)
 C/w aspirin . Hold tenormin  and imdur  due to BP. C.w. crestor .

## 2024-03-24 NOTE — Assessment & Plan Note (Signed)
 Chronic, continue with sertraline

## 2024-03-24 NOTE — Assessment & Plan Note (Signed)
 Continue with Tums

## 2024-03-24 NOTE — Care Management (Addendum)
 Transition of Care [facility]) - Emergency Department Mini Assessment   Patient Details  Name: Jody Shaw MRN: [id number] Date of Birth: 07/03/1941  [facility] Care [facility]) CM/SW Contact:    Naoma Bacca, RN Phone Number: 03/24/2024, 2:58 PM   Clinical Narrative: Patient currently at [facility] ED due to mechanical call on yesterday. Received TOC consult for HH vs SNF. This RNCM spoke with patient's daughter at bedside who reports patient lives in home alone. Daughter reports patient needs SNF placement, however no PT eval ordered. This RNCM unabe to speak with patient due to getting CT scan.   TOC will continue to follow.    -6pm this RNCM spoke with patient and daughter at bedside, patient is wanting [facility] services. Patient is being admitted, TOC will follow.    ED Mini Assessment: What brought you to the Emergency Department? : fall on yesterday  Barriers to Discharge: Continued Medical Work up  [employer] interventions: to feel better          Patient Designer, industrial/product        ,            CMS Medicare.gov Compare Post Acute Care list provided to:: Patient Represenative (must comment) (Daughter: Mia Adam) Choice offered to / list presented to : Adult Children  Admission diagnosis:  Fall Patient Active Problem List   Diagnosis Date Noted   Vision loss of left eye 07/31/2017   Difficulty swallowing 07/31/2017   Hypokalemia 07/31/2017   Giant cell arteritis (HCC) 07/31/2017   GERD (gastroesophageal reflux disease)    Hypothyroidism    Essential hypertension 11/24/2014   CAD (coronary artery disease) 11/24/2014   Hyperlipidemia 11/24/2014   PCP:  Rosslyn Coons, MD Pharmacy:   [facility] [phone] [place], [place] - [address] [place] [address] Phone: [phone] Fax: [phone]  [facility] [place], [place] - [address] [address] Phone: [phone] Fax:  [phone]  [id number] [place] - [address] AT [facility] [address] [place] [address] Phone: [phone] Fax: [phone]

## 2024-03-24 NOTE — Assessment & Plan Note (Signed)
 At this time patient is felt to be dehydrated.  Hold torsemide 

## 2024-03-24 NOTE — Plan of Care (Signed)
   Problem: Coping: Goal: Level of anxiety will decrease Outcome: Progressing   Problem: Pain Managment: Goal: General experience of comfort will improve and/or be controlled Outcome: Progressing   Problem: Safety: Goal: Ability to remain free from injury will improve Outcome: Progressing

## 2024-03-24 NOTE — Assessment & Plan Note (Addendum)
 Although patient does not have fever or tachycardia, I do believe patient is having an infectious source for this leukocytosis.  I will draw blood cultures.  There are several potential infectious sources for this lady including possible urinary tract infection, cellulitis of the legs as described in the my physical exam and potentially a pneumonia because patient has had cough and shortness of breath for about a week although her chest x-ray is underpenetrated.  Patient has already received vancomycin in the ER, RT-PCR is negative, we will continue treatment with ceftriaxone  and azithromycin

## 2024-03-24 NOTE — Assessment & Plan Note (Addendum)
 Complicated by mild hyperkalemia, no peaked T waves.  Patient's baseline creatinine is essentially less than 1.3.  Currently 4.48.  I note that the patient has had poor appetite last several days and low urine output reported by family as well as finding of diastolic blood pressure less than 60 here in the ER.  Patient does practice fluid restriction given her chronic lymphedema.  Therefore I think my working diagnosis is fluid depletion in this lady and therefore we will treat with 1 L fluid bolus now and then 100 cc/h overnight.  We will monitor intake output and daily weight.  Urinalysis is showing positive hemoglobin by dipstick but not by microscopy, check CK.  Ultrasound of the kidney ureter bladder system is pending we will follow this up.  I did discuss the case with Dr. Christianne Cowper of nephrology service, she will see the patient in the morning. High anion gap acidosis. Check lactic acid level

## 2024-03-24 NOTE — H&P (Signed)
 History and Physical    Patient: Jody Shaw JXB:[id number] DOB: 1941-08-09 DOA: 03/24/2024 DOS: the patient was seen and examined on 03/24/2024 PCP: Rosslyn Coons, MD  Patient coming from: Home  Chief Complaint:  Chief Complaint  Patient presents with   Fall    Pt arrives via ems after fall yesterday with walker +headstrike, -LOC, -thinner use. Hx lymphedema, states she is dehydrated, daughter concerned of UTI    HPI: Jody Shaw is a 83 y.o. female with medical history significant of lymphedema of the legs.  For which she gets chronic dressing.  Patient seems to have been in her usual state of health till about a week ago when patient reports new onset of cough dry with some sensation of shortness of breath.  Patient denies any fever chest pain.  Patient did have 1 episode of vomiting last evening.  Regardless patient has gotten progressively weaker and generalized manner over the last 1 week.  Her appetite has also been poor.  There is no report of patient having diarrhea or rigors or rash on her skin, except as noted below regarding the legs.  Patient also has chronic lower extremity swelling that is attributed to lymphedema.  There seems to have been some purulent drainage from the legs earlier, but this is not an active issue at this time.  Patient's leg has been painful.  Patient's leg dressing seems to have been that yesterday and patient describes getting up out of her chair and slipping due to the dressing being wet.  Subsequently she says that she was helped by her niece and the fire department to get back in the bed.  However given her extremely poor functional status, daughter insisted that the patient come to the ER today and is brought to [facility], ER.  Patient noted to be vitally stable however her BUN and creatinine are markedly elevated here in the ER.  Patient reports not making much urine.  Patient is also found to have some leukocytes in the urine.  Patient is  s/p vancomycin for indication of leg cellulitis. Medical eval is sought.  Review of Systems: As mentioned in the history of present illness. All other systems reviewed and are negative. Past Medical History:  Diagnosis Date   Coronary artery disease    s/p rotational atherectomy to the LAD in 1996 // Nuc 9/13: no ischemia   Echocardiogram    Echo 9/19:  EF 55-60, normal wall motion, grade 1 diastolic dysfunction, MAC   GERD (gastroesophageal reflux disease)    Giant cell arteritis (HCC)    HLD (hyperlipidemia)    Hypothyroidism    Vision loss of left eye    Past Surgical History:  Procedure Laterality Date   ARTERY BIOPSY Left 08/03/2017   Procedure: BIOPSY TEMPORAL ARTERY;  Surgeon: Dorrie Gaudier, Alphonso Aschoff, MD;  Location: [facility];  Service: General;  Laterality: Left;   Social History:  reports that she has quit smoking. She has never used smokeless tobacco. She reports that she does not drink alcohol and does not use drugs.  Allergies  Allergen Reactions   Iodine    Septra [Sulfamethoxazole-Trimethoprim]    Sulfa Antibiotics    Watermelon [Citrullus Vulgaris]     Family History  Problem Relation Age of Onset   Heart disease Mother    Heart disease Father     Prior to Admission medications   Medication Sig Start Date End Date Taking? Authorizing Provider  aspirin  81 MG tablet Take 81 mg by  mouth as needed.   Yes [provider]  atenolol  (TENORMIN ) 25 MG tablet TAKE 1 TABLET BY MOUTH EVERY DAY. 11/09/23  Yes [name], NP  calcium  carbonate (TUMS - DOSED IN MG ELEMENTAL CALCIUM ) 500 MG chewable tablet Chew 1 tablet by mouth as needed for indigestion or heartburn. Per patient taking every 2 days   Yes [provider]  ferrous sulfate 324 MG TBEC Take 324 mg by mouth daily.   Yes [provider]  isosorbide  mononitrate (IMDUR ) 30 MG 24 hr tablet TAKE 1 TABLET BY MOUTH EVERY EVENING 03/11/24  Yes [name], [name], DO  levothyroxine  (SYNTHROID ,  LEVOTHROID) 88 MCG tablet Take 88 mcg by mouth daily before breakfast.   Yes [provider]  losartan  (COZAAR ) 50 MG tablet Take 1 tablet (50 mg total) by mouth daily. 11/09/23  Yes [name], NP  Multiple Vitamin (MULTI VITAMIN DAILY) TABS Take 2 tablets by mouth daily.    Yes [provider]  nitroGLYCERIN  (NITROSTAT ) 0.4 MG SL tablet Place 1 tablet (0.4 mg total) under the tongue every 5 (five) minutes as needed for chest pain. 10/19/22  Yes Conte, Tessa N, PA-C  predniSONE  (DELTASONE ) 5 MG tablet Take 5 mg by mouth daily.   Yes [provider]  rosuvastatin  (CRESTOR ) 10 MG tablet Take 1 tablet by mouth 2 times a week. 11/09/23  Yes [name], NP  sertraline (ZOLOFT) 25 MG tablet Take 25 mg by mouth daily. 02/15/24  Yes [provider]  sodium chloride  (MURO 128) 5 % ophthalmic solution Place 1 drop into the right eye every 4 (four) hours as needed for eye irritation.   Yes [provider]  torsemide  (DEMADEX ) 20 MG tablet Take 1 tablet (20 mg total) by mouth in the morning. 03/11/24  Yes [name], [name], DO  triamcinolone cream (KENALOG) 0.1 % Apply topically as needed.   Yes [provider]  potassium chloride  SA (K-DUR) 20 MEQ tablet Take 1 tablet (20 mEq total) by mouth daily. Patient not taking: Reported on 03/11/2024 05/09/19   Arty Binning, MD    Physical Exam: Vitals:   03/24/24 1052 03/24/24 1601  BP: (!) 121/53 (!) 113/52  Pulse: 70 79  Resp: 20 18  Temp: 97.8 F (36.6 C) 97.6 F (36.4 C)  TempSrc: Oral Oral  SpO2: 98% 99%   General: Fatigued appearing lady no immediate distress apparent.  Patient herself gave the history as above in a fully coherent manner. Respiratory exam: Bilateral fine basilar crackles no expiratory wheezes Cardiovascular exam S1-S2 normal Abdomen soft nontender Extremities warm, chronic appearing nonpitting edema of the legs with some dry epithelia as well as "cobblestoning "of the  soft tissue.  Patient has erythema of the dependent portion of the right leg.  With marked pain with local palpation.  Distal function is intact joints do not seem to be involved.  Patient's left leg was dressed with a dressing which was adherent to the skin and therefore could not be totally removed.  But no gross purulence or wounds are noted.  See photo in chart.   Media Information   Document Information  Photos  Legs  03/24/2024 18:40  Attached To:  Hospital Encounter on 03/24/24  Source Information  Bennie Brave, MD  [facility]  Document History    Data Reviewed:  Labs on Admission:  Results for orders placed or performed during the hospital encounter of 03/24/24 (from the past 24 hours)  CBC with Differential  Status: Abnormal   Collection Time: 03/24/24  2:04 PM  Result Value Ref Range   WBC 19.7 (H) 4.0 - 10.5 K/uL   RBC 3.34 (L) 3.87 - 5.11 MIL/uL   Hemoglobin 9.6 (L) 12.0 - 15.0 g/dL   HCT 21.3 (L) 08.6 - 57.8 %   MCV 86.2 80.0 - 100.0 fL   MCH 28.7 26.0 - 34.0 pg   MCHC 33.3 30.0 - 36.0 g/dL   RDW 46.9 (H) 62.9 - 52.8 %   Platelets 345 150 - 400 K/uL   nRBC 0.0 0.0 - 0.2 %   Neutrophils Relative % 86 %   Neutro Abs 16.9 (H) 1.7 - 7.7 K/uL   Lymphocytes Relative 1 %   Lymphs Abs 0.2 (L) 0.7 - 4.0 K/uL   Monocytes Relative 11 %   Monocytes Absolute 2.1 (H) 0.1 - 1.0 K/uL   Eosinophils Relative 0 %   Eosinophils Absolute 0.0 0.0 - 0.5 K/uL   Basophils Relative 0 %   Basophils Absolute 0.0 0.0 - 0.1 K/uL   Immature Granulocytes 2 %   Abs Immature Granulocytes 0.46 (H) 0.00 - 0.07 K/uL  Comprehensive metabolic panel     Status: Abnormal   Collection Time: 03/24/24  2:04 PM  Result Value Ref Range   Sodium 131 (L) 135 - 145 mmol/L   Potassium 5.5 (H) 3.5 - 5.1 mmol/L   Chloride 102 98 - 111 mmol/L   CO2 11 (L) 22 - 32 mmol/L   Glucose, Bld 109 (H) 70 - 99 mg/dL   BUN 413 (H) 8 - 23 mg/dL   Creatinine, Ser 2.44 (H) 0.44 - 1.00 mg/dL   Calcium   8.3 (L) 8.9 - 10.3 mg/dL   Total Protein 7.6 6.5 - 8.1 g/dL   Albumin 2.7 (L) 3.5 - 5.0 g/dL   AST 30 15 - 41 U/L   ALT 20 0 - 44 U/L   Alkaline Phosphatase 66 38 - 126 U/L   Total Bilirubin 0.9 0.0 - 1.2 mg/dL   GFR, Estimated 9 (L) >60 mL/min   Anion gap 18 (H) 5 - 15  Resp panel by RT-PCR (RSV, Flu A&B, Covid) Anterior Nasal Swab     Status: None   Collection Time: 03/24/24  2:04 PM   Specimen: Anterior Nasal Swab  Result Value Ref Range   SARS Coronavirus 2 by RT PCR NEGATIVE NEGATIVE   Influenza A by PCR NEGATIVE NEGATIVE   Influenza B by PCR NEGATIVE NEGATIVE   Resp Syncytial Virus by PCR NEGATIVE NEGATIVE  Urinalysis, w/ Reflex to Culture (Infection Suspected) -Urine, Clean Catch     Status: Abnormal   Collection Time: 03/24/24  4:11 PM  Result Value Ref Range   Specimen Source URINE, CLEAN CATCH    Color, Urine YELLOW YELLOW   APPearance CLOUDY (A) CLEAR   Specific Gravity, Urine 1.012 1.005 - 1.030   pH 5.0 5.0 - 8.0   Glucose, UA NEGATIVE NEGATIVE mg/dL   Hgb urine dipstick MODERATE (A) NEGATIVE   Bilirubin Urine NEGATIVE NEGATIVE   Ketones, ur NEGATIVE NEGATIVE mg/dL   Protein, ur NEGATIVE NEGATIVE mg/dL   Nitrite NEGATIVE NEGATIVE   Leukocytes,Ua LARGE (A) NEGATIVE   RBC / HPF 0-5 0 - 5 RBC/hpf   WBC, UA 21-50 0 - 5 WBC/hpf   Bacteria, UA MANY (A) NONE SEEN   Squamous Epithelial / HPF 21-50 0 - 5 /HPF   Hyaline Casts, UA PRESENT    Basic Metabolic Panel: Recent Labs  Lab  03/24/24 1404  NA 131*  K 5.5*  CL 102  CO2 11*  GLUCOSE 109*  BUN 169*  CREATININE 4.48*  CALCIUM  8.3*   Liver Function Tests: Recent Labs  Lab 03/24/24 1404  AST 30  ALT 20  ALKPHOS 66  BILITOT 0.9  PROT 7.6  ALBUMIN 2.7*   No results for input(s): "LIPASE", "AMYLASE" in the last 168 hours. No results for input(s): "AMMONIA" in the last 168 hours. CBC: Recent Labs  Lab 03/24/24 1404  WBC 19.7*  NEUTROABS 16.9*  HGB 9.6*  HCT 28.8*  MCV 86.2  PLT 345   Cardiac  Enzymes: No results for input(s): "CKTOTAL", "CKMB", "CKMBINDEX", "TROPONINIHS" in the last 168 hours.  BNP (last 3 results) No results for input(s): "PROBNP" in the last 8760 hours. CBG: No results for input(s): "GLUCAP" in the last 168 hours.  Radiological Exams on Admission:  CT Head Wo Contrast Result Date: 03/24/2024 CLINICAL DATA:  Head trauma, minor (Age >= 65y) Trip and fall yesterday. EXAM: CT HEAD WITHOUT CONTRAST TECHNIQUE: Contiguous axial images were obtained from the base of the skull through the vertex without intravenous contrast. RADIATION DOSE REDUCTION: This exam was performed according to the departmental dose-optimization program which includes automated exposure control, adjustment of the mA and/or kV according to patient size and/or use of iterative reconstruction technique. COMPARISON:  Head CT 07/31/2017 FINDINGS: Brain: No intracranial hemorrhage, mass effect, or midline shift. Brain volume is normal for age. No hydrocephalus. The basilar cisterns are patent. No evidence of territorial infarct or acute ischemia. No extra-axial or intracranial fluid collection. Vascular: Atherosclerosis of skullbase vasculature without hyperdense vessel or abnormal calcification. Skull: No fracture or focal lesion.  Chronic bifrontal hyperostosis. Sinuses/Orbits: No acute finding. Partial chronic opacification of lower right mastoid air cells. Bilateral cataract resection. Other: None. IMPRESSION: No acute intracranial abnormality. No skull fracture. Electronically Signed   By: Chadwick Colonel M.D.   On: 03/24/2024 16:24   CT Cervical Spine Wo Contrast Result Date: 03/24/2024 CLINICAL DATA:  Neck trauma. Patient slipped and fell yesterday. Down for approximately 3 hours. Generalized soreness. EXAM: CT CERVICAL SPINE WITHOUT CONTRAST TECHNIQUE: Multidetector CT imaging of the cervical spine was performed without intravenous contrast. Multiplanar CT image reconstructions were also generated.  RADIATION DOSE REDUCTION: This exam was performed according to the departmental dose-optimization program which includes automated exposure control, adjustment of the mA and/or kV according to patient size and/or use of iterative reconstruction technique. COMPARISON:  None Available. FINDINGS: Alignment: Straightening without focal angulation or listhesis. Skull base and vertebrae: No evidence of acute cervical spine fracture or traumatic subluxation. The bones appear mildly demineralized. There is mild multilevel spondylosis. Soft tissues and spinal canal: No prevertebral fluid or swelling. No visible canal hematoma. Disc levels: Mild multilevel spondylosis with disc space narrowing, uncinate spurring and facet hypertrophy. Focal posterior osteophyte versus ossification of the posterior longitudinal ligament posterior to the C2 vertebral body. No evidence of large disc herniation. Mild-to-moderate osseous foraminal narrowing at multiple levels. Upper chest: No acute findings. Linear scarring at the left lung apex. Other: No acute soft tissue findings identified in the neck. Bilateral carotid atherosclerosis. IMPRESSION: 1. No evidence of acute cervical spine fracture, traumatic subluxation or static signs of instability. 2. Mild multilevel cervical spondylosis. Electronically Signed   By: Elmon Hagedorn M.D.   On: 03/24/2024 16:22   DG Tibia/Fibula Right Result Date: 03/24/2024 CLINICAL DATA:  Bilateral lower leg lymphedema with right leg pain. EXAM: RIGHT TIBIA AND FIBULA - 2  VIEW COMPARISON:  None Available. FINDINGS: No acute fracture. The knee and ankle joints appear anatomically aligned. Mild degenerative changes of the knee. Tibiotalar joint space narrowing. Diffuse nonspecific generalized subcutaneous edema of the right lower extremity. No soft tissue gas identified. IMPRESSION: 1. No acute osseous abnormality. 2. Mild degenerative changes of the right knee and ankle. 3. Diffuse nonspecific generalized  subcutaneous edema of the right lower extremity. Electronically Signed   By: Mannie Seek M.D.   On: 03/24/2024 14:50   DG Chest 2 View Result Date: 03/24/2024 CLINICAL DATA:  Cough. EXAM: CHEST - 2 VIEW COMPARISON:  X-ray 02/17/2018 FINDINGS: Normal cardiopericardial silhouette. Calcified aorta. No pneumothorax, effusion or edema. No consolidation. There is some linear opacity at the bases likely scar or atelectasis. Degenerative changes along the spine. Films are under penetrated. IMPRESSION: Under penetrated radiographs. Basilar scar or atelectasis. Calcified aorta. Electronically Signed   By: Adrianna Horde M.D.   On: 03/24/2024 14:26    chest X-ray  EKG: no peaked T waves  No intake/output data recorded. No intake/output data recorded.    Assessment and Plan: * AKI (acute kidney injury) (HCC) Complicated by mild hyperkalemia, no peaked T waves.  Patient's baseline creatinine is essentially less than 1.3.  Currently 4.48.  I note that the patient has had poor appetite last several days and low urine output reported by family as well as finding of diastolic blood pressure less than 60 here in the ER.  Patient does practice fluid restriction given her chronic lymphedema.  Therefore I think my working diagnosis is fluid depletion in this lady and therefore we will treat with 1 L fluid bolus now and then 100 cc/h overnight.  We will monitor intake output and daily weight.  Urinalysis is showing positive hemoglobin by dipstick but not by microscopy, check CK.  Ultrasound of the kidney ureter bladder system is pending we will follow this up.  I did discuss the case with Dr. Christianne Cowper of nephrology service, she will see the patient in the morning. High anion gap acidosis. Check lactic acid level  Anxiety Chronic, continue with sertraline  Lymphedema At this time patient is felt to be dehydrated.  Hold torsemide   Leukocytosis Although patient does not have fever or tachycardia, I do believe  patient is having an infectious source for this leukocytosis.  I will draw blood cultures.  There are several potential infectious sources for this lady including possible urinary tract infection, cellulitis of the legs as described in the my physical exam and potentially a pneumonia because patient has had cough and shortness of breath for about a week although her chest x-ray is underpenetrated.  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  Patient is currently on chronic prednisone  accordingly.  Given patient's soft blood pressure at this time I will give her stress dose steroids tonight.  Hypothyroidism Continue with Synthroid   GERD (gastroesophageal reflux disease) Continue with Tums  CAD (coronary artery disease) C/w aspirin . Hold tenormin  and imdur  due to BP. C.w. crestor .  Essential hypertension Hold atenolol  due to soft BP as above. Hold imdur . Hold losartan       Advance Care Planning:   Code Status: Prior patient wishes to be DNR with ok to intubate  Consults: renal as above.  Family Communication: daughter at bedside. Discussed in detail.  Severity of  Illness: The appropriate patient status for this patient is INPATIENT. Inpatient status is judged to be reasonable and necessary in order to provide the required intensity of service to ensure the patient's safety. The patient's presenting symptoms, physical exam findings, and initial radiographic and laboratory data in the context of their chronic comorbidities is felt to place them at high risk for further clinical deterioration. Furthermore, it is not anticipated that the patient will be medically stable for discharge from the hospital within 2 midnights of admission.   * I certify that at the point of admission it is my clinical judgment  that the patient will require inpatient hospital care spanning beyond 2 midnights from the point of admission due to high intensity of service, high risk for further deterioration and high frequency of surveillance required.*  Author: Bennie Brave, MD 03/24/2024 6:34 PM  For on call review [email or web address].

## 2024-03-24 NOTE — ED Provider Notes (Signed)
 [place] EMERGENCY DEPARTMENT AT [facility] Provider Note   CSN: [id number] Arrival date & time: 03/24/24  1042     History  Chief Complaint  Patient presents with   Fall    Pt arrives via ems after fall yesterday with walker +headstrike, -LOC, -thinner use. Hx lymphedema, states she is dehydrated, daughter concerned of UTI     Jody Shaw is a 83 y.o. female with PMHx CAD, GERD, giant cell arteritis, HLD, hypothyroidism who presents to ED concerned for mechanical fall yesterday. Patient stating that she fell d/t her leg weakness and pain from the BL chronic lymphedema.  Patient also endorsing congestion and cough developing this past week.  Patient also endorses an episode of vomiting last night. Denies fever, chest pain, SOB, abdominal pain, diarrhea, dysuria, hematuria.  Daughter at bedside is concerned for possible dehydration vs UTI.   Patient is not on blood thinners. Patient endorses head trauma. Denies LOC or seizures. Patient stating that tylenol  has been helping her BL LE pain but she keeps forgetting to take her medication since she has been feeling so poorly recently.    Fall       Home Medications Prior to Admission medications   Medication Sig Start Date End Date Taking? Authorizing Provider  aspirin  81 MG tablet Take 81 mg by mouth as needed.    [provider]  atenolol  (TENORMIN ) 25 MG tablet TAKE 1 TABLET BY MOUTH EVERY DAY. 11/09/23   [name], NP  calcium  carbonate (TUMS - DOSED IN MG ELEMENTAL CALCIUM ) 500 MG chewable tablet Chew 1 tablet by mouth as needed for indigestion or heartburn. Per patient taking every 2 days    [provider]  ferrous sulfate 324 MG TBEC Take 324 mg by mouth daily.    [provider]  isosorbide  mononitrate (IMDUR ) 30 MG 24 hr tablet TAKE 1 TABLET BY MOUTH EVERY EVENING 03/11/24   [name], [name], DO  levothyroxine  (SYNTHROID , LEVOTHROID) 88 MCG tablet Take 88 mcg by mouth daily  before breakfast.    [provider]  losartan  (COZAAR ) 50 MG tablet Take 1 tablet (50 mg total) by mouth daily. 11/09/23   [name], NP  Multiple Vitamin (MULTI VITAMIN DAILY) TABS Take 2 tablets by mouth daily.     [provider]  nitroGLYCERIN  (NITROSTAT ) 0.4 MG SL tablet Place 1 tablet (0.4 mg total) under the tongue every 5 (five) minutes as needed for chest pain. 10/19/22   Von Grumbling, PA-C  potassium chloride  SA (K-DUR) 20 MEQ tablet Take 1 tablet (20 mEq total) by mouth daily. Patient not taking: Reported on 03/11/2024 05/09/19   Arty Binning, MD  predniSONE  (DELTASONE ) 5 MG tablet Take 5 mg by mouth daily.    [provider]  rosuvastatin  (CRESTOR ) 10 MG tablet Take 1 tablet by mouth 2 times a week. 11/09/23   [name], NP  torsemide  (DEMADEX ) 20 MG tablet Take 1 tablet (20 mg total) by mouth in the morning. 03/11/24   [name], [name], DO  triamcinolone cream (KENALOG) 0.1 % Apply topically as needed.    [provider]      Allergies    Iodine, Septra [sulfamethoxazole-trimethoprim], Sulfa antibiotics, and Watermelon [citrullus vulgaris]    Review of Systems   Review of Systems  Neurological:  Positive for weakness.    Physical Exam Updated Vital Signs BP (!) 113/52 (BP Location: Left Arm)   Pulse 79   Temp 97.6 F (36.4  C) (Oral)   Resp 18   SpO2 99%  Physical Exam Vitals and nursing note reviewed.  Constitutional:      General: She is not in acute distress.    Appearance: She is ill-appearing. She is not toxic-appearing.  HENT:     Head: Normocephalic and atraumatic.     Mouth/Throat:     Mouth: Mucous membranes are moist.  Eyes:     General: No scleral icterus.       Right eye: No discharge.        Left eye: No discharge.     Conjunctiva/sclera: Conjunctivae normal.  Cardiovascular:     Rate and Rhythm: Normal rate and regular rhythm.     Pulses: Normal pulses.     Heart sounds: Normal heart sounds. No  murmur heard. Pulmonary:     Effort: Pulmonary effort is normal. No respiratory distress.     Breath sounds: Normal breath sounds. No wheezing, rhonchi or rales.  Abdominal:     General: Abdomen is flat. Bowel sounds are normal. There is no distension.     Palpations: Abdomen is soft. There is no mass.     Tenderness: There is no abdominal tenderness.  Musculoskeletal:     Right lower leg: Edema present.     Left lower leg: Edema present.     Comments: BL erythematous lymphadenopathy  Skin:    General: Skin is warm and dry.     Findings: No rash.  Neurological:     General: No focal deficit present.     Mental Status: She is alert and oriented to person, place, and time. Mental status is at baseline.  Psychiatric:        Mood and Affect: Mood normal.        Behavior: Behavior normal.     ED Results / Procedures / Treatments   Labs (all labs ordered are listed, but only abnormal results are displayed) Labs Reviewed  CBC WITH DIFFERENTIAL/PLATELET - Abnormal; Notable for the following components:      Result Value   WBC 19.7 (*)    RBC 3.34 (*)    Hemoglobin 9.6 (*)    HCT 28.8 (*)    RDW 16.5 (*)    Neutro Abs 16.9 (*)    Lymphs Abs 0.2 (*)    Monocytes Absolute 2.1 (*)    Abs Immature Granulocytes 0.46 (*)    All other components within normal limits  URINALYSIS, W/ REFLEX TO CULTURE (INFECTION SUSPECTED) - Abnormal; Notable for the following components:   APPearance CLOUDY (*)    Hgb urine dipstick MODERATE (*)    Leukocytes,Ua LARGE (*)    Bacteria, UA MANY (*)    All other components within normal limits  COMPREHENSIVE METABOLIC PANEL WITH GFR - Abnormal; Notable for the following components:   Sodium 131 (*)    Potassium 5.5 (*)    CO2 11 (*)    Glucose, Bld 109 (*)    BUN 169 (*)    Creatinine, Ser 4.48 (*)    Calcium  8.3 (*)    Albumin 2.7 (*)    GFR, Estimated 9 (*)    Anion gap 18 (*)    All other components within normal limits  RESP PANEL BY RT-PCR  (RSV, FLU A&B, COVID)  RVPGX2  SODIUM, URINE, RANDOM  CREATININE, URINE, RANDOM    EKG None  Radiology CT Head Wo Contrast Result Date: 03/24/2024 CLINICAL DATA:  Head trauma, minor (Age >= 65y) Trip and fall  yesterday. EXAM: CT HEAD WITHOUT CONTRAST TECHNIQUE: Contiguous axial images were obtained from the base of the skull through the vertex without intravenous contrast. RADIATION DOSE REDUCTION: This exam was performed according to the departmental dose-optimization program which includes automated exposure control, adjustment of the mA and/or kV according to patient size and/or use of iterative reconstruction technique. COMPARISON:  Head CT 07/31/2017 FINDINGS: Brain: No intracranial hemorrhage, mass effect, or midline shift. Brain volume is normal for age. No hydrocephalus. The basilar cisterns are patent. No evidence of territorial infarct or acute ischemia. No extra-axial or intracranial fluid collection. Vascular: Atherosclerosis of skullbase vasculature without hyperdense vessel or abnormal calcification. Skull: No fracture or focal lesion.  Chronic bifrontal hyperostosis. Sinuses/Orbits: No acute finding. Partial chronic opacification of lower right mastoid air cells. Bilateral cataract resection. Other: None. IMPRESSION: No acute intracranial abnormality. No skull fracture. Electronically Signed   By: Chadwick Colonel M.D.   On: 03/24/2024 16:24   CT Cervical Spine Wo Contrast Result Date: 03/24/2024 CLINICAL DATA:  Neck trauma. Patient slipped and fell yesterday. Down for approximately 3 hours. Generalized soreness. EXAM: CT CERVICAL SPINE WITHOUT CONTRAST TECHNIQUE: Multidetector CT imaging of the cervical spine was performed without intravenous contrast. Multiplanar CT image reconstructions were also generated. RADIATION DOSE REDUCTION: This exam was performed according to the departmental dose-optimization program which includes automated exposure control, adjustment of the mA and/or kV  according to patient size and/or use of iterative reconstruction technique. COMPARISON:  None Available. FINDINGS: Alignment: Straightening without focal angulation or listhesis. Skull base and vertebrae: No evidence of acute cervical spine fracture or traumatic subluxation. The bones appear mildly demineralized. There is mild multilevel spondylosis. Soft tissues and spinal canal: No prevertebral fluid or swelling. No visible canal hematoma. Disc levels: Mild multilevel spondylosis with disc space narrowing, uncinate spurring and facet hypertrophy. Focal posterior osteophyte versus ossification of the posterior longitudinal ligament posterior to the C2 vertebral body. No evidence of large disc herniation. Mild-to-moderate osseous foraminal narrowing at multiple levels. Upper chest: No acute findings. Linear scarring at the left lung apex. Other: No acute soft tissue findings identified in the neck. Bilateral carotid atherosclerosis. IMPRESSION: 1. No evidence of acute cervical spine fracture, traumatic subluxation or static signs of instability. 2. Mild multilevel cervical spondylosis. Electronically Signed   By: Elmon Hagedorn M.D.   On: 03/24/2024 16:22   DG Tibia/Fibula Right Result Date: 03/24/2024 CLINICAL DATA:  Bilateral lower leg lymphedema with right leg pain. EXAM: RIGHT TIBIA AND FIBULA - 2 VIEW COMPARISON:  None Available. FINDINGS: No acute fracture. The knee and ankle joints appear anatomically aligned. Mild degenerative changes of the knee. Tibiotalar joint space narrowing. Diffuse nonspecific generalized subcutaneous edema of the right lower extremity. No soft tissue gas identified. IMPRESSION: 1. No acute osseous abnormality. 2. Mild degenerative changes of the right knee and ankle. 3. Diffuse nonspecific generalized subcutaneous edema of the right lower extremity. Electronically Signed   By: Mannie Seek M.D.   On: 03/24/2024 14:50   DG Chest 2 View Result Date: 03/24/2024 CLINICAL DATA:   Cough. EXAM: CHEST - 2 VIEW COMPARISON:  X-ray 02/17/2018 FINDINGS: Normal cardiopericardial silhouette. Calcified aorta. No pneumothorax, effusion or edema. No consolidation. There is some linear opacity at the bases likely scar or atelectasis. Degenerative changes along the spine. Films are under penetrated. IMPRESSION: Under penetrated radiographs. Basilar scar or atelectasis. Calcified aorta. Electronically Signed   By: Adrianna Horde M.D.   On: 03/24/2024 14:26    Procedures .Critical Care  Performed  by: Penuelas Bureau, PA-C Authorized by: Sparks Bureau, PA-C   Critical care provider statement:    Critical care time (minutes):  30   Critical care was necessary to treat or prevent imminent or life-threatening deterioration of the following conditions:  Dehydration   Critical care was time spent personally by me on the following activities:  Development of treatment plan with patient or surrogate, discussions with consultants, evaluation of patient's response to treatment, examination of patient, ordering and review of laboratory studies, ordering and review of radiographic studies, ordering and performing treatments and interventions, pulse oximetry, re-evaluation of patient's condition and review of old charts   Care discussed with: admitting provider   Comments:     AKI, UTI, cellulitis requiring hospital admission     Medications Ordered in ED Medications  vancomycin (VANCOREADY) IVPB 2000 mg/400 mL (2,000 mg Intravenous New Bag/Given 03/24/24 1724)  acetaminophen  (TYLENOL ) tablet 650 mg (650 mg Oral Given 03/24/24 1313)  acetaminophen  (TYLENOL ) tablet 650 mg (650 mg Oral Given 03/24/24 1728)  0.9 %  sodium chloride  infusion ( Intravenous New Bag/Given 03/24/24 1723)    ED Course/ Medical Decision Making/ A&P                                 Medical Decision Making Amount and/or Complexity of Data Reviewed Labs: ordered. Radiology: ordered.  Risk OTC  drugs. Prescription drug management.   This patient presents to the ED after a fall, this involves an extensive number of treatment options, and is a complaint that carries with it a high risk of complications and morbidity.  The differential diagnosis includes  intracranial hemorrhage, subdural/epidural hematoma, vertebral fracture, spinal cord injury, muscle strain, skull fracture, fracture.   Co morbidities that complicate the patient evaluation  CAD, GERD, giant cell arteritis, HLD, hypothyroidism   Additional history obtained:  07/2018 ECHO: 55-60% EF   Problem List / ED Course / Critical interventions / Medication management  Patient presented for mechanical fall. Patient with stable vitals and does not appear to be in distress. Patient concerned for progressive weakness, cough, and congestion over this past week - leading to the fall yesterday. Patient with episode of vomiting yesterday which has since resolved.  Physical exam with erythematous lymphadema of BL LE.  Rest of physical exam reassuring.  Patient afebrile with stable vitals. I Ordered, and personally interpreted labs.  UA with large leukocytes, many bacteria, and 21-50 squamous epithelial cells.  Respiratory panel negative.  CBC with leukocytosis at 19.7.  There is also anemia with hemoglobin at 9.6.  CMP with mild hyponatremia at 131.  There is also hyperkalemia at 5.5 without acute EKG changes.  BUN/creatinine elevated at 169/4.48. The patient was maintained on a cardiac monitor.  I personally viewed and interpreted the cardiac monitored which showed an underlying rhythm of: Sinus rhythm I ordered imaging studies including CT head/CT cervical spine, tibia-fibula x-ray, chest x-ray. I independently visualized and interpreted imaging which showed no acute process. I agree with the radiologist interpretation. Suspicious that leukocytosis may be due to cellulitis and UTI.  Weakness probably due to AKI. Family members stating  that they might be looking at moving patient to nursing facility.  I have started the social work process to help them at discharge. Started patient on IV fluid rehydration. Also started patient on Vanc for her cellulitis.  I requested consultation with the hospitalist on-call Dr. Roxy Cordial, and discussed lab and  imaging findings as well as pertinent plan - they agree to admit patient. I have reviewed the patients home medicines and have made adjustments as needed   Social Determinants of Health:  geriatric           Final Clinical Impression(s) / ED Diagnoses Final diagnoses:  AKI (acute kidney injury) (HCC)  Acute cystitis with hematuria  Cellulitis of lower extremity, unspecified laterality    Rx / DC Orders ED Discharge Orders     None         Don Fritter 03/24/24 1815    Cheyenne Cotta, MD 03/24/24 1824

## 2024-03-24 NOTE — Assessment & Plan Note (Signed)
 Continue with Synthroid 

## 2024-03-25 ENCOUNTER — Inpatient Hospital Stay (HOSPITAL_COMMUNITY)

## 2024-03-25 ENCOUNTER — Encounter (HOSPITAL_COMMUNITY): Payer: Self-pay | Admitting: Internal Medicine

## 2024-03-25 DIAGNOSIS — I70229 Atherosclerosis of native arteries of extremities with rest pain, unspecified extremity: Secondary | ICD-10-CM

## 2024-03-25 DIAGNOSIS — N179 Acute kidney failure, unspecified: Secondary | ICD-10-CM | POA: Diagnosis not present

## 2024-03-25 LAB — BASIC METABOLIC PANEL WITH GFR
Anion gap: 15 (ref 5–15)
Anion gap: 12 (ref 5–15)
Anion gap: 14 (ref 5–15)
BUN: 161 mg/dL — ABNORMAL HIGH (ref 8–23)
BUN: 163 mg/dL — ABNORMAL HIGH (ref 8–23)
BUN: 140 mg/dL — ABNORMAL HIGH (ref 8–23)
CO2: 11 mmol/L — ABNORMAL LOW (ref 22–32)
CO2: 13 mmol/L — ABNORMAL LOW (ref 22–32)
CO2: 12 mmol/L — ABNORMAL LOW (ref 22–32)
Calcium: 7.7 mg/dL — ABNORMAL LOW (ref 8.9–10.3)
Calcium: 7.5 mg/dL — ABNORMAL LOW (ref 8.9–10.3)
Calcium: 7.8 mg/dL — ABNORMAL LOW (ref 8.9–10.3)
Chloride: 106 mmol/L (ref 98–111)
Chloride: 108 mmol/L (ref 98–111)
Chloride: 111 mmol/L (ref 98–111)
Creatinine, Ser: 3.59 mg/dL — ABNORMAL HIGH (ref 0.44–1.00)
Creatinine, Ser: 3.27 mg/dL — ABNORMAL HIGH (ref 0.44–1.00)
Creatinine, Ser: 2.5 mg/dL — ABNORMAL HIGH (ref 0.44–1.00)
GFR, Estimated: 12 mL/min — ABNORMAL LOW (ref >60)
GFR, Estimated: 13 mL/min — ABNORMAL LOW (ref >60)
GFR, Estimated: 18 mL/min — ABNORMAL LOW (ref >60)
Glucose, Bld: 114 mg/dL — ABNORMAL HIGH (ref 70–99)
Glucose, Bld: 126 mg/dL — ABNORMAL HIGH (ref 70–99)
Glucose, Bld: 140 mg/dL — ABNORMAL HIGH (ref 70–99)
Potassium: 4 mmol/L (ref 3.5–5.1)
Potassium: 4.2 mmol/L (ref 3.5–5.1)
Potassium: 4 mmol/L (ref 3.5–5.1)
Sodium: 132 mmol/L — ABNORMAL LOW (ref 135–145)
Sodium: 133 mmol/L — ABNORMAL LOW (ref 135–145)
Sodium: 137 mmol/L (ref 135–145)

## 2024-03-25 LAB — IRON AND TIBC
Iron: 38 ug/dL (ref 28–170)
Saturation Ratios: 22 % (ref 10.4–31.8)
TIBC: 176 ug/dL — ABNORMAL LOW (ref 250–450)
UIBC: 138 ug/dL

## 2024-03-25 LAB — LACTIC ACID, PLASMA: Lactic Acid, Venous: 0.7 mmol/L (ref 0.5–1.9)

## 2024-03-25 LAB — CBC
HCT: 25.4 % — ABNORMAL LOW (ref 36.0–46.0)
HCT: 26.1 % — ABNORMAL LOW (ref 36.0–46.0)
Hemoglobin: 8.2 g/dL — ABNORMAL LOW (ref 12.0–15.0)
Hemoglobin: 8.3 g/dL — ABNORMAL LOW (ref 12.0–15.0)
MCH: 29.1 pg (ref 26.0–34.0)
MCH: 28.5 pg (ref 26.0–34.0)
MCHC: 32.3 g/dL (ref 30.0–36.0)
MCHC: 31.8 g/dL (ref 30.0–36.0)
MCV: 90.1 fL (ref 80.0–100.0)
MCV: 89.7 fL (ref 80.0–100.0)
Platelets: 291 10*3/uL (ref 150–400)
Platelets: 301 10*3/uL (ref 150–400)
RBC: 2.82 MIL/uL — ABNORMAL LOW (ref 3.87–5.11)
RBC: 2.91 MIL/uL — ABNORMAL LOW (ref 3.87–5.11)
RDW: 16.5 % — ABNORMAL HIGH (ref 11.5–15.5)
RDW: 16.6 % — ABNORMAL HIGH (ref 11.5–15.5)
WBC: 16.3 10*3/uL — ABNORMAL HIGH (ref 4.0–10.5)
WBC: 13.8 10*3/uL — ABNORMAL HIGH (ref 4.0–10.5)
nRBC: 0 % (ref 0.0–0.2)
nRBC: 0 % (ref 0.0–0.2)

## 2024-03-25 LAB — TROPONIN I (HIGH SENSITIVITY)
Troponin I (High Sensitivity): 48 ng/L — ABNORMAL HIGH (ref <18)
Troponin I (High Sensitivity): 33 ng/L — ABNORMAL HIGH (ref <18)

## 2024-03-25 LAB — APTT: aPTT: 32 s (ref 24–36)

## 2024-03-25 LAB — CK
Total CK: 422 U/L — ABNORMAL HIGH (ref 38–234)
Total CK: 268 U/L — ABNORMAL HIGH (ref 38–234)

## 2024-03-25 LAB — PROTIME-INR
INR: 1.5 — ABNORMAL HIGH (ref 0.8–1.2)
Prothrombin Time: 18.1 s — ABNORMAL HIGH (ref 11.4–15.2)

## 2024-03-25 MED ORDER — AZITHROMYCIN 250 MG PO TABS
500.0000 mg | ORAL_TABLET | Freq: Every day | ORAL | Status: DC
  Administered 2024-03-25: 500 mg via ORAL
  Filled 2024-03-25: qty 2

## 2024-03-25 MED ORDER — LACTATED RINGERS IV BOLUS
500.0000 mL | Freq: Once | INTRAVENOUS | Status: AC
  Administered 2024-03-25: 500 mL via INTRAVENOUS

## 2024-03-25 MED ORDER — SODIUM CHLORIDE 0.9 % IV SOLN: Freq: Once | INTRAVENOUS | Status: AC

## 2024-03-25 MED ORDER — OXYCODONE HCL 5 MG PO TABS
5.0000 mg | ORAL_TABLET | Freq: Four times a day (QID) | ORAL | Status: DC | PRN
  Administered 2024-03-25 – 2024-03-26 (×3): 5 mg via ORAL
  Filled 2024-03-25 (×3): qty 1

## 2024-03-25 MED ORDER — SODIUM CHLORIDE 0.9 % IV BOLUS
2000.0000 mL | Freq: Once | INTRAVENOUS | Status: AC
  Administered 2024-03-25: 2000 mL via INTRAVENOUS

## 2024-03-25 MED ORDER — HYDROCORTISONE SOD SUC (PF) 100 MG IJ SOLR
50.0000 mg | Freq: Four times a day (QID) | INTRAMUSCULAR | Status: DC
  Administered 2024-03-25 – 2024-03-28 (×12): 50 mg via INTRAVENOUS
  Filled 2024-03-25 (×14): qty 1

## 2024-03-25 NOTE — Consult Note (Addendum)
 WOC Nurse Consult Note:patient with history of lymphedema and has history of hospitalizations for lower leg cellulitis  Reason for Consult:B lower legs  Wound type: partial and full thickness wounds r/t lymphatic and venous insufficiency  Pressure Injury POA: NA, not related to pressure  Measurement: see nursing flowsheet  Wound bed: appears pink moist  Drainage (amount, consistency, odor) appears serous, dressing on L leg stuck to wound bed with dry serous appearing drainage  Periwound: erythema, edema, cobblestoned and hyperkeratotic skin consistent with lymphedema changes  Dressing procedure/placement/frequency:  Cleanse B lower legs (intact skin and open wounds) with Vashe wound cleanser Timm Foot [phone]) do not rinse and allow to air dry. Apply Xeroform gauze (Lawson [phone]) to wound beds daily, cover with ABD pads and Kerlix roll gauze beginning right above toes and ending right below knees. Apply Ace bandage wrapped in same fashion as Kerlix for light compression. Patient should keep legs elevated as much as possible. SOAK XEROFORM GAUZE WITH NS IF STUCK TO WOUND BED FOR ATRAUMATIC REMOVAL.   Patient would benefit from ongoing management of lymphedema at lymphedema clinic or wound care center.   Lymphedema  Resources (updated 09/2021) Each site requires a referral from your primary care MD [facility] [address] [place], [place]  [phone] (Upper extremities)  [address] [place], [place] [phone] (Lower extremities, PATIENT CAN NOT HAVE A WOUND)  [facility] [address] [place], [place] [address] [phone]  [facility] [address] Office [address]  [place], [place] [phone]  [facility] [address] [place], [place] [address] [phone]  [facility]  (only  treatment for lymphedema related to cancer diagnosis) [address]  [place], [place] [address] [phone]    [facility] [address] [place], [place] [address] [phone] [facility] ([facility] [id number] [place], [place] [address] [phone]    POC discussed with bedside nurse. WOC team will not follow. Re-consult if further needs arise.   Thank you,    Ronni Colace MSN, RN-BC, [employer] [phone]

## 2024-03-25 NOTE — TOC Initial Note (Signed)
 Transition of Care [facility]) - Initial/Assessment Note    Patient Details  Name: Jody Shaw MRN: [id number] Date of Birth: 07/15/1941  Transition of Care [facility]) CM/SW Contact:    Bari Leys, RN Phone Number: 03/25/2024, 2:13 PM  Clinical Narrative:   Met with patient and pt's daughter, Mia Adam, at bedside to introduce role of TOC/NCM and review for dc planning, Glynis Lass confirmed patient resides alone in a private residence, Mia Adam reports she lives in [place] and visits patient at least twice weekly to assist with appointments, chores, prepare meals, etc. Mia Adam reports she has a sister that lives in the [place] area. Mia Adam reports patient has been independent in the past but now family feels patient may need placement in an ALF or LTC, would like patient to go to short term rehab following current hospitalization, reports her sister is looking at an ALF in the [place] area. PT eval pending, await recommendation. Mia Adam reports patient has a RW, wheelchair and potty chair at home. Referral sent to [facility] for assist with placement resources. TOC will continue to follow, await PT recommendation.                 Expected Discharge Plan: Skilled Nursing Facility Barriers to Discharge: Continued Medical Work up   Patient Goals and CMS Choice Patient states their goals for this hospitalization and ongoing recovery are:: short term rehab CMS Medicare.gov Compare Post Acute Care list provided to:: Patient Represenative (must comment) (Daughter: Mia Adam) Choice offered to / list presented to : Adult Children      Expected Discharge Plan and Services       Living arrangements for the past 2 months: Single Family Home                                      Prior Living Arrangements/Services Living arrangements for the past 2 months: Single Family Home Lives with:: Self Patient language and need for interpreter reviewed:: Yes        Need for Family  Participation in Patient Care: Yes (Comment) Care giver support system in place?: Yes (comment) Current home services: DME (wheelchair, walker, potty chair) Criminal Activity/Legal Involvement Pertinent to Current Situation/Hospitalization: No - Comment as needed  Activities of Daily Living   ADL Screening (condition at time of admission) Independently performs ADLs?: No Does the patient have a NEW difficulty with bathing/dressing/toileting/self-feeding that is expected to last >3 days?: Yes (Initiates electronic notice to provider for possible OT consult) Does the patient have a NEW difficulty with getting in/out of bed, walking, or climbing stairs that is expected to last >3 days?: Yes (Initiates electronic notice to provider for possible PT consult) Does the patient have a NEW difficulty with communication that is expected to last >3 days?: Yes (Initiates electronic notice to provider for possible SLP consult) Is the patient deaf or have difficulty hearing?: No Does the patient have difficulty seeing, even when wearing glasses/contacts?: No Does the patient have difficulty concentrating, remembering, or making decisions?: No  Permission Sought/Granted                  Emotional Assessment Appearance:: Appears stated age Attitude/Demeanor/Rapport: Gracious Affect (typically observed): Accepting Orientation: : Oriented to Self, Oriented to Place, Oriented to  Time, Oriented to Situation Alcohol / Substance Use: Not Applicable Psych Involvement: No (comment)  Admission diagnosis:  Acute cystitis with hematuria [N30.01] AKI (acute kidney  injury) (HCC) [N17.9] Cellulitis of lower extremity, unspecified laterality [L03.119] Patient Active Problem List   Diagnosis Date Noted   AKI (acute kidney injury) (HCC) 03/24/2024   Leukocytosis 03/24/2024   Lymphedema 03/24/2024   Anxiety 03/24/2024   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 [place] [address] Phone: [phone] Fax: [phone]     Social Drivers of Health (SDOH) Social History: SDOH Screenings   Food Insecurity: No Food Insecurity (03/24/2024)  Housing: Low Risk  (03/24/2024)  Transportation Needs: No Transportation Needs (03/24/2024)  Utilities: Not At Risk (03/24/2024)  Financial Resource Strain: Low Risk  (10/16/2023)   Received from [facility]  Tobacco Use: Medium Risk (03/25/2024)   SDOH Interventions:     Readmission Risk Interventions    03/25/2024    2:11 PM  Readmission Risk Prevention Plan  Transportation Screening Complete  PCP or Specialist Appt within 5-7 Days Complete  Home Care Screening Complete  Medication Review (RN CM) Complete

## 2024-03-25 NOTE — Plan of Care (Signed)

## 2024-03-25 NOTE — Progress Notes (Signed)
 Physical Therapy Evaluation Patient Details Name: Jody Shaw MRN: [id number] DOB: 10-Feb-1941 Today's Date: 03/25/2024  History of Present Illness  83 yo female presents to therapy following hospital admission on 03/24/2024 due to mechanical fall in which pt struck head and reportedly on floor for 2-3hrs. Pt indicates progressive weakness, poor PO intake, SOB and cough. Pt has chronic LE lymphedema. Pt was found to have AKI with elevated CK and troponin levels. Head CT [facility]. 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  Clinical Impression    Pt admitted with above diagnosis.  Pt currently with functional limitations due to the deficits listed below (see PT Problem List). Pt in bed when PT arrived, daughter present. Pt required encouragement for participation with therapy. Pt required max A for supine to sit, heightened sensitivity to B LE with light touch impacting pt able to transition to EOB, sitting balance EOB F, mod A x 2 for sit to supine and total A to reposition in bed. Pt Bp semi reclined in bed 96/59 and PR 66 with notes of soft Bp in am. Pt denied dizziness seated EOB. Pt limited with progression to transfers and gait due to fatigued and pain. Pt left in bed, all needs met and daughter present.  Patient will benefit from continued inpatient follow up therapy, <3 hours/day. Pt will benefit from acute skilled PT to increase their independence and safety with mobility to allow discharge.         If plan is discharge home, recommend the following: Two people to help with walking and/or transfers;Two people to help with bathing/dressing/bathroom;Assistance with cooking/housework;Assist for transportation;Help with stairs or ramp for entrance   Can travel by private vehicle        Equipment Recommendations None recommended by PT  Recommendations for Other Services       Functional Status Assessment Patient has had a  recent decline in their functional status and demonstrates the ability to make significant improvements in function in a reasonable and predictable amount of time.     Precautions / Restrictions Precautions Precautions: Fall Restrictions Weight Bearing Restrictions Per Provider Order: No      Mobility  Bed Mobility Overal bed mobility: Needs Assistance Bed Mobility: Rolling, Supine to Sit, Sit to Supine Rolling: Mod assist   Supine to sit: Max assist, HOB elevated, Used rails Sit to supine: Mod assist, +2 for physical assistance, +2 for safety/equipment   General bed mobility comments: increased time and cues for encouragement for supine to sit, increased pain with light tough to B LE R >L and with pressure to posterior distal B LE    Transfers                   General transfer comment: NT pt declined    Ambulation/Gait               General Gait Details: NT  Stairs            Wheelchair Mobility     Tilt Bed    Modified Rankin (Stroke Patients Only)       Balance Overall balance assessment: History of Falls, Needs assistance Sitting-balance support: Feet unsupported, Single extremity supported Sitting balance-Leahy Scale: Fair  Pertinent Vitals/Pain Pain Assessment Pain Assessment: 0-10 Pain Score: 4  Pain Location: B LE (chronic) Pain Descriptors / Indicators: Aching, Constant, Grimacing, Guarding Pain Intervention(s): Limited activity within patient's tolerance, Monitored during session, Repositioned, Premedicated before session    Home Living Family/patient expects to be discharged to:: Private residence Living Arrangements: Alone Available Help at Discharge: Family;Available PRN/intermittently Type of Home: House Home Access: Ramped entrance     Alternate Level Stairs-Number of Steps: she sleeps in a chair on the main level of the home, as she does not access the upper level  of the home; 1/2 bath only on main leve. full bath and bedrooms upstairs Home Layout: Two level;1/2 bath on main level Home Equipment: Rollator (4 wheels);BSC/3in1;Wheelchair - manual      Prior Function Prior Level of Function : Needs assist             Mobility Comments: She used a rollator inside the home and manual wheelchair for community mobility. ADLs Comments: Her neighbor got her mail for her and took at the trash, she used a BSC for toileting at night and ambulated to the bathroom with a rollator during the day, she took spongebaths, and she mostly stayed in her pajamas or robe. Her daughter takes her to appointments, gets her grocery, and assists with cleaning.     Extremity/Trunk Assessment   Upper Extremity Assessment Upper Extremity Assessment: Defer to OT evaluation    Lower Extremity Assessment Lower Extremity Assessment: Generalized weakness RLE Deficits / Details: hypersensitivity to touch, due to lymphedema LLE Deficits / Details: hypersensitivity to touch, due to lymphedema    Cervical / Trunk Assessment Cervical / Trunk Assessment: Kyphotic  Communication   Communication Communication: Impaired Factors Affecting Communication: Hearing impaired    Cognition Arousal: Alert Behavior During Therapy: WFL for tasks assessed/performed                             Following commands: Intact       Cueing       General Comments General comments (skin integrity, edema, etc.): B LE edema, open areas B LE and increased sensitivity to light touch, pt c/o back itching and noted some blotchy discoloration and nursing aware    Exercises General Exercises - Lower Extremity Ankle Circles/Pumps: AROM, Both, 10 reps (pt encouraged to perform B ankle pumps)   Assessment/Plan    PT Assessment Patient needs continued PT services  PT Problem List Decreased strength;Decreased activity tolerance;Decreased balance;Decreased mobility;Decreased  coordination;Pain;Decreased skin integrity       PT Treatment Interventions DME instruction;Gait training;Functional mobility training;Therapeutic activities;Therapeutic exercise;Balance training;Neuromuscular re-education;Patient/family education    PT Goals (Current goals can be found in the Care Plan section)  Acute Rehab PT Goals Patient Stated Goal: to be stronger and go home PT Goal Formulation: With patient/family Time For Goal Achievement: 04/08/24 Potential to Achieve Goals: Fair    Frequency Min 2X/week     Co-evaluation               AM-PAC PT "6 Clicks" Mobility  Outcome Measure Help needed turning from your back to your side while in a flat bed without using bedrails?: A Lot Help needed moving from lying on your back to sitting on the side of a flat bed without using bedrails?: A Lot Help needed moving to and from a bed to a chair (including a wheelchair)?: Total Help needed standing up from a chair using your arms (  e.g., wheelchair or bedside chair)?: Total Help needed to walk in hospital room?: Total Help needed climbing 3-5 steps with a railing? : Total 6 Click Score: 8    End of Session   Activity Tolerance: Patient limited by pain;Patient limited by fatigue Patient left: in bed;with call bell/phone within reach;with family/visitor present Nurse Communication: Mobility status PT Visit Diagnosis: Unsteadiness on feet (R26.81);Other abnormalities of gait and mobility (R26.89);Muscle weakness (generalized) (M62.81);History of falling (Z91.81);Difficulty in walking, not elsewhere classified (R26.2);Pain Pain - Right/Left:  (B) Pain - part of body: Leg;Ankle and joints of foot    Time: 9147-8295 PT Time Calculation (min) (ACUTE ONLY): 38 min   Charges:   PT Evaluation $PT Eval Low Complexity: 1 Low PT Treatments $Therapeutic Activity: 23-37 mins PT General Charges $$ ACUTE PT VISIT: 1 Visit         Cary Clarks, PT Acute Rehab   Annalee Kiang 03/25/2024, 6:48 PM

## 2024-03-25 NOTE — Evaluation (Signed)
 Occupational Therapy Evaluation Patient Details Name: Jody Shaw MRN: [id number] DOB: 01-11-1941 Today's Date: 03/25/2024   History of Present Illness   Jody Shaw is a 83 yr old female brought to the hospital after a fall; she was reportedly down for ~3 hours. Pt was found to have AKI. PMH: chronic lymphedema, CAD, HLD, vision loss L eye     Clinical Impressions The pt is currently presenting with the below listed deficits (see OT problem list). As such, she requires assistance for tasks, including dressing, bathing and toileting. She was further noted to be with general deconditioning and B LE edema and hypersensitivity to touch, given her history of lymphedema. OT anticipates it would be difficult for the pt to manage her self-care needs in the home environment at current, given her functional limitations and considering she lives alone. Short-term SNF rehab is therefore recommended, to help the pt maximize her functional abilities and to decrease the risk for further restricted ADL participation.      If plan is discharge home, recommend the following:   A lot of help with walking and/or transfers;A lot of help with bathing/dressing/bathroom;Assist for transportation;Assistance with cooking/housework;Help with stairs or ramp for entrance     Functional Status Assessment   Patient has had a recent decline in their functional status and demonstrates the ability to make significant improvements in function in a reasonable and predictable amount of time.     Equipment Recommendations   Other (comment) (defer to next level of care)     Recommendations for Other Services         Precautions/Restrictions   Precautions Precautions: Fall     Mobility Bed Mobility Overal bed mobility: Needs Assistance Bed Mobility: Rolling Rolling: Mod assist                      ADL either performed or assessed with clinical judgement   ADL Overall ADL's : Needs  assistance/impaired Eating/Feeding: Set up;Bed level   Grooming: Set up;Bed level           Upper Body Dressing : Minimal assistance;Bed level   Lower Body Dressing: Bed level;Total assistance                       Vision   Additional Comments: vision impairment of L eye (chronic impairment)            Pertinent Vitals/Pain Pain Assessment Pain Assessment: 0-10 Pain Score: 3  Pain Location: B LE (chronic) Pain Intervention(s): Monitored during session, Repositioned     Extremity/Trunk Assessment Upper Extremity Assessment Upper Extremity Assessment: Left hand dominant;Overall WFL for tasks assessed   Lower Extremity Assessment Lower Extremity Assessment: Generalized weakness;RLE deficits/detail;LLE deficits/detail RLE Deficits / Details: hypersensitivity to touch, due to lymphedema LLE Deficits / Details: hypersensitivity to touch, due to lymphedema       Communication Communication Factors Affecting Communication: Hearing impaired   Cognition Arousal: Alert Behavior During Therapy: WFL for tasks assessed/performed               OT - Cognition Comments: Oriented x4                 Following commands: Intact                  Home Living Family/patient expects to be discharged to:: Private residence Living Arrangements: Alone Available Help at Discharge: Family;Available PRN/intermittently Type of Home: House Home Access: Ramped entrance  Home Layout: Two level;1/2 bath on main level Alternate Level Stairs-Number of Steps: she sleeps in a chair on the main level of the home, as she does not access the upper level of the home; 1/2 bath only on main leve. full bath and bedrooms upstairs             Home Equipment: Rollator (4 wheels);BSC/3in1;Wheelchair - manual          Prior Functioning/Environment Prior Level of Function : Needs assist             Mobility Comments: She used a rollator inside the home and  manual wheelchair for community mobility. ADLs Comments: Her neighbor got her mail for her and took at the trash, she used a BSC for toileting at night and ambulated to the bathroom with a rollator during the day, she took spongebaths, and she mostly stayed in her pajamas or robe. Her daughter takes her to appointments, gets her grocery, and assists with cleaning.    OT Problem List: Decreased strength;Decreased activity tolerance;Impaired balance (sitting and/or standing);Decreased knowledge of use of DME or AE;Increased edema;Pain   OT Treatment/Interventions: Splinting;Therapeutic activities;Therapeutic exercise;Energy conservation;DME and/or AE instruction;Patient/family education;Balance training      OT Goals(Current goals can be found in the care plan section)   Acute Rehab OT Goals OT Goal Formulation: With patient Time For Goal Achievement: 04/08/24 Potential to Achieve Goals: Good ADL Goals Pt Will Perform Grooming: with set-up;sitting Pt Will Perform Upper Body Dressing: with set-up;sitting Pt Will Transfer to Toilet: with supervision;ambulating Pt Will Perform Toileting - Clothing Manipulation and hygiene: with supervision;sit to/from stand   OT Frequency:  Min 2X/week       AM-PAC OT "6 Clicks" Daily Activity     Outcome Measure Help from another person eating meals?: A Little Help from another person taking care of personal grooming?: A Little Help from another person toileting, which includes using toliet, bedpan, or urinal?: A Lot Help from another person bathing (including washing, rinsing, drying)?: A Lot Help from another person to put on and taking off regular upper body clothing?: A Little Help from another person to put on and taking off regular lower body clothing?: Total 6 Click Score: 14   End of Session Equipment Utilized During Treatment: Other (comment) (N/A) Nurse Communication: Mobility status  Activity Tolerance: Other (comment) (fair  tolerance) Patient left: in bed;with call bell/phone within reach;with family/visitor present;with nursing/sitter in room  OT Visit Diagnosis: Muscle weakness (generalized) (M62.81);History of falling (Z91.81);Pain                Time: 6578-4696 OT Time Calculation (min): 20 min Charges:  OT General Charges $OT Visit: 1 Visit OT Evaluation $OT Eval Moderate Complexity: 1 Mod    Crescentia Boutwell L Cristin Penaflor, OTR/L 03/25/2024, 5:47 PM

## 2024-03-25 NOTE — Progress Notes (Signed)
 ABI exam has been completed.    Results can be found under chart review under CV PROC. 03/25/2024 5:11 PM Reilyn Nelson RVT, RDMS

## 2024-03-25 NOTE — Plan of Care (Signed)
  Problem: Coping: Goal: Level of anxiety will decrease Outcome: Progressing   Problem: Nutrition: Goal: Adequate nutrition will be maintained Outcome: Progressing   Problem: Pain Managment: Goal: General experience of comfort will improve and/or be controlled Outcome: Progressing   Problem: Safety: Goal: Ability to remain free from injury will improve Outcome: Progressing

## 2024-03-25 NOTE — Progress Notes (Signed)
 PROGRESS NOTE  Jody Shaw  ZOX:[id number] DOB: 1940/12/01 DOA: 03/24/2024 PCP: Rosslyn Coons, MD  Consultants  Brief Narrative: 83 y.o. female with known CKD stage III, chronic bilateral lymphedema, giant cell arteritis, hypothyroidism who presents with worsening lymphedema and leg pain as well as increasing weakness and decreasing appetite over the past week.  Did have a fall at home attributed to leaking of her lymphedema causing her some ox to become wet and sleeping.  Was on the ground for about 3 hours if not a bit longer.  Brought to the ER for the same.  Assessment & Plan: * AKI (acute kidney injury) [facility]) -Nephrology consulted by admitting physician.  Appreciate input. - Baseline GFR seems to be in the 50s.  Creatinine was 4 and downtrending to 3.5 in setting of poor p.o. intake.  Plus hypotension and resultant volume depletion from poor p.o. intake plus ongoing usage of home ARB and loop diuretics.   -Continue IV fluids and oral hydration.  Trend creatinine   Anxiety Chronic, continue with sertraline   Lymphedema At this time patient is felt to be dehydrated.  Hold torsemide  -Chronic.  Greatly appreciate wound input. - Redness is greatly abated from admitting picture. - Question of possible cellulitis she is on antibiotics. -ABIs pending.  Does have some history of rest pain   Leukocytosis -Question of lower extremity cellulitis versus UTI.  Plan to continue antibiotics for now and trend white blood cells.   Giant cell arteritis (HCC) -Chronic prednisone . - Started on stress dose hydrocortisone due to hypotension on admission. - Not symptomatic.  Continue to follow vitals.   Hypothyroidism Continue with Synthroid  -TSH in a.m.   GERD (gastroesophageal reflux disease) Continue with Tums   CAD (coronary artery disease) C/w aspirin . Hold tenormin  and imdur  due to BP. C.w. crestor .   Essential hypertension Hold atenolol  due to soft BP as above. Hold imdur . Hold  losartan   DVT prophylaxis:  heparin injection 5,000 Units Start: 03/25/24 0600 SCDs Start: 03/24/24 1856  Code Status:   Code Status: Do not attempt resuscitation (DNR) PRE-ARREST INTERVENTIONS DESIRED Family Communication: Daughter at bedside Level of care: Med-Surg Status is: Inpatient Dispo: Pending resolution of AKI.   Consults called: Nephrology  Subjective: Patient awake and alert this morning.  She is in relatively good spirits.  Feels that her legs have greatly improved since coming to the hospital.  Otherwise no concerns or complaints  Objective: Vitals:   03/25/24 0153 03/25/24 0500 03/25/24 0619 03/25/24 0814  BP: (!) 89/50  (!) 96/55 (!) 99/47  Pulse: 75  83 80  Resp: 18  16 16   Temp: 97.6 F (36.4 C)  97.6 F (36.4 C) 97.7 F (36.5 C)  TempSrc: Oral  Oral   SpO2: 97%  100% 100%  Weight:  90.6 kg    Height:        Intake/Output Summary (Last 24 hours) at 03/25/2024 1600 Last data filed at 03/25/2024 1155 Gross per 24 hour  Intake 3027.63 ml  Output 1750 ml  Net 1277.63 ml   Filed Weights   03/24/24 1900 03/25/24 0500  Weight: 93 kg 90.6 kg   Body mass index is 36.53 kg/m.  Gen: 83 y.o. female in no apparent distress.  Nontoxic Pulm: Non-labored breathing.  Clear to auscultation bilaterally.  CV: Regular rate and rhythm. No murmur, rub, or gallop. No JVD GI: Abdomen soft, non-tender, non-distended, obese Ext: Warm, with chronic lymphedema and chronic skin changes. Skin: Does have some mild erythema bilateral pretibial  shins Neuro: Alert and oriented. No focal neurological deficits. Psych: Calm  Judgement and insight appear normal. Mood & affect appropriate.     I have personally reviewed the following labs and images: CBC: Recent Labs  Lab 03/24/24 1404 03/25/24 0316 03/25/24 1246  WBC 19.7* 16.3* 13.8*  NEUTROABS 16.9*  --   --   HGB 9.6* 8.2* 8.3*  HCT 28.8* 25.4* 26.1*  MCV 86.2 90.1 89.7  PLT 345 291 301   BMP &GFR Recent Labs   Lab 03/24/24 1404 03/25/24 0039 03/25/24 0316 03/25/24 1246  NA 131* 132* 133* 137  K 5.5* 4.0 4.2 4.0  CL 102 106 108 111  CO2 11* 11* 13* 12*  GLUCOSE 109* 114* 126* 140*  BUN 169* 161* 163* 140*  CREATININE 4.48* 3.59* 3.27* 2.50*  CALCIUM  8.3* 7.7* 7.5* 7.8*   Estimated Creatinine Clearance: 17.2 mL/min (A) (by C-G formula based on SCr of 2.5 mg/dL (H)). Liver & Pancreas: Recent Labs  Lab 03/24/24 1404  AST 30  ALT 20  ALKPHOS 66  BILITOT 0.9  PROT 7.6  ALBUMIN 2.7*   No results for input(s): "LIPASE", "AMYLASE" in the last 168 hours. No results for input(s): "AMMONIA" in the last 168 hours. Diabetic: No results for input(s): "HGBA1C" in the last 72 hours. No results for input(s): "GLUCAP" in the last 168 hours. Cardiac Enzymes: Recent Labs  Lab 03/25/24 0039 03/25/24 1246  CKTOTAL 422* 268*   No results for input(s): "PROBNP" in the last 8760 hours. Coagulation Profile: Recent Labs  Lab 03/25/24 0316  INR 1.5*   Thyroid Function Tests: No results for input(s): "TSH", "T4TOTAL", "FREET4", "T3FREE", "THYROIDAB" in the last 72 hours. Lipid Profile: No results for input(s): "CHOL", "HDL", "LDLCALC", "TRIG", "CHOLHDL", "LDLDIRECT" in the last 72 hours. Anemia Panel: Recent Labs    03/25/24 1246  TIBC 176*  IRON 38   Urine analysis:    Component Value Date/Time   COLORURINE YELLOW 03/24/2024 1611   APPEARANCEUR CLOUDY (A) 03/24/2024 1611   LABSPEC 1.012 03/24/2024 1611   PHURINE 5.0 03/24/2024 1611   GLUCOSEU NEGATIVE 03/24/2024 1611   HGBUR MODERATE (A) 03/24/2024 1611   BILIRUBINUR NEGATIVE 03/24/2024 1611   KETONESUR NEGATIVE 03/24/2024 1611   PROTEINUR NEGATIVE 03/24/2024 1611   NITRITE NEGATIVE 03/24/2024 1611   LEUKOCYTESUR LARGE (A) 03/24/2024 1611   Sepsis Labs: Invalid input(s): "PROCALCITONIN", "LACTICIDVEN"  Microbiology: Recent Results (from the past 240 hours)  Resp panel by RT-PCR (RSV, Flu A&B, Covid) Anterior Nasal Swab      Status: None   Collection Time: 03/24/24  2:04 PM   Specimen: Anterior Nasal Swab  Result Value Ref Range Status   SARS Coronavirus 2 by RT PCR NEGATIVE NEGATIVE Final    Comment: (NOTE) SARS-CoV-2 target nucleic acids are NOT DETECTED.  The SARS-CoV-2 RNA is generally detectable in upper respiratory specimens during the acute phase of infection. The lowest concentration of SARS-CoV-2 viral copies this assay can detect is 138 copies/mL. A negative result does not preclude SARS-Cov-2 infection and should not be used as the sole basis for treatment or other patient management decisions. A negative result may occur with  improper specimen collection/handling, submission of specimen other than nasopharyngeal swab, presence of viral mutation(s) within the areas targeted by this assay, and inadequate number of viral copies(<138 copies/mL). A negative result must be combined with clinical observations, patient history, and epidemiological information. The expected result is Negative.  Fact Sheet for Patients:  BloggerCourse.com  Fact Sheet for Healthcare  Providers:  SeriousBroker.it  This test is no t yet approved or cleared by the [place]  FDA and  has been authorized for detection and/or diagnosis of SARS-CoV-2 by FDA under an Emergency Use Authorization (EUA). This EUA will remain  in effect (meaning this test can be used) for the duration of the COVID-19 declaration under Section 564(b)(1) of the Act, 21 U.S.C.section 360bbb-3(b)(1), unless the authorization is terminated  or revoked sooner.       Influenza A by PCR NEGATIVE NEGATIVE Final   Influenza B by PCR NEGATIVE NEGATIVE Final    Comment: (NOTE) The Xpert Xpress SARS-CoV-2/FLU/RSV plus assay is intended as an aid in the diagnosis of influenza from Nasopharyngeal swab specimens and should not be used as a sole basis for treatment. Nasal washings and aspirates are  unacceptable for Xpert Xpress SARS-CoV-2/FLU/RSV testing.  Fact Sheet for Patients: BloggerCourse.com  Fact Sheet for Healthcare Providers: SeriousBroker.it  This test is not yet approved or cleared by the [place]  FDA and has been authorized for detection and/or diagnosis of SARS-CoV-2 by FDA under an Emergency Use Authorization (EUA). This EUA will remain in effect (meaning this test can be used) for the duration of the COVID-19 declaration under Section 564(b)(1) of the Act, 21 U.S.C. section 360bbb-3(b)(1), unless the authorization is terminated or revoked.     Resp Syncytial Virus by PCR NEGATIVE NEGATIVE Final    Comment: (NOTE) Fact Sheet for Patients: BloggerCourse.com  Fact Sheet for Healthcare Providers: SeriousBroker.it  This test is not yet approved or cleared by the [place]  FDA and has been authorized for detection and/or diagnosis of SARS-CoV-2 by FDA under an Emergency Use Authorization (EUA). This EUA will remain in effect (meaning this test can be used) for the duration of the COVID-19 declaration under Section 564(b)(1) of the Act, 21 U.S.C. section 360bbb-3(b)(1), unless the authorization is terminated or revoked.  Performed at [facility], [address] [place], [place] [address]     Radiology Studies: US  RENAL Result Date: 03/24/2024 CLINICAL DATA:  [id number] AKI (acute kidney injury) (HCC) [id number] EXAM: RENAL / URINARY TRACT ULTRASOUND COMPLETE COMPARISON:  None Available. FINDINGS: Right Kidney: Renal measurements: 9.7 x 5.4 x 6.9 cm = volume: 190 mL. Mild cortical thinning with otherwise normal echogenicity. No mass. No hydronephrosis or nephrolithiasis. Left Kidney: Renal measurements: 9.9 x 5.3 x 5 cm = volume: 137 mL. Mild cortical thinning with otherwise normal echogenicity. No mass. No hydronephrosis or nephrolithiasis.  Bladder: Appears normal for degree of bladder distention. Other: None. IMPRESSION: No hydronephrosis or nephrolithiasis. Renal parenchymal changes suggestive of chronic medical renal disease. Electronically Signed   By: Rance Burrows M.D.   On: 03/24/2024 19:44    Scheduled Meds:  aspirin  EC  81 mg Oral Daily   azithromycin  500 mg Oral QHS   heparin  5,000 Units Subcutaneous Q8H   hydrocortisone sod succinate (SOLU-CORTEF) inj  50 mg Intravenous Q6H   levothyroxine   88 mcg Oral QAC breakfast   rosuvastatin   10 mg Oral Once per day on Tuesday Friday   sertraline  25 mg Oral Daily   sodium chloride  flush  3 mL Intravenous Q12H   Continuous Infusions:  cefTRIAXone  (ROCEPHIN )  IV 1 g (03/25/24 0113)   lactated ringers      sodium chloride        LOS: 1 day   35 minutes with more than 50% spent in reviewing records, counseling patient/family and coordinating care.  Trenton Frock, MD [facility]  [email or web address] 03/25/2024, 4:00 PM

## 2024-03-25 NOTE — Progress Notes (Addendum)
 BP soft at 89/50, patient asymptomatic, notified on-call provider (J.Daniels, NP), order received for LR bolus, infusing at this time, will recheck pressure upon completion.  On-call provider also notified of her elevated CK and troponin lab values, no further orders at this time.

## 2024-03-25 NOTE — Consult Note (Addendum)
 Renal Service Consult Note [facility]  BRICE SCHURZ 03/25/2024 Lynae Sandifer, MD Requesting Physician: Dr. Betsey Brow  Reason for Consult: renal faliure  HPI: The patient is a 83 y.o. year-old w/ PMH as below who presented to ED yesterday afternoon after mechanical fall.  Patient also had some vomiting recently no fever chest pain no abdominal pain.  The family was worried about possible UTI and/or dehydration.  Patient has history of chronic leg swelling due to lymphedema.  She has a history of temporal arteritis as well.  She had been getting more weak and weaker in a generalized manner over the last 1 to 2 weeks.  Poor appetite also.  No diarrhea.  Given her extremely poor functional status the daughter insisted that she come to the emergency room.  In ED labs were okay blood pressure was soft and BUN/creatinine were markedly elevated.  Patient endorsed not making much urine..  There is some question of whether she was treated with vancomycin recently for leg infection.  Patient was admitted, we are asked to see for renal failure.   Pt seen in hospital room.  Patient lives at home alone, her friend or family member at the bedside takes her to the grocery and comes and visits with her several times a week.  She gets around okay with a walker at home.  She has chronic lymphedema in her legs and occasionally gets cellulitis attacks.  Her family member at the bedside said she has become very weak and she has not been eating or drinking at home prior to admission here.  She is originally from [place]. Her husband of 62 years died 1 year ago.  They used to live in [place]  before moving to [place] .  ROS - denies CP, no joint pain, no HA, no blurry vision, no rash, no diarrhea, no nausea/ vomiting  PMH: CAD GERD Giant cell arteritis HL Hypothyroidism Vision loss left eye  Past Surgical History  Past Surgical History:  Procedure Laterality Date   ARTERY BIOPSY  Left 08/03/2017   Procedure: BIOPSY TEMPORAL ARTERY;  Surgeon: Dorrie Gaudier, Alphonso Aschoff, MD;  Location: [facility];  Service: General;  Laterality: Left;   Family History  Family History  Problem Relation Age of Onset   Heart disease Mother    Heart disease Father    Social History  reports that she has quit smoking. She has never used smokeless tobacco. She reports that she does not drink alcohol and does not use drugs. Allergies  Allergies  Allergen Reactions   Iodine    Septra [Sulfamethoxazole-Trimethoprim]    Sulfa Antibiotics    Watermelon [Citrullus Vulgaris]    Home medications Prior to Admission medications   Medication Sig Start Date End Date Taking? Authorizing Provider  aspirin  81 MG tablet Take 81 mg by mouth as needed.   Yes [provider]  atenolol  (TENORMIN ) 25 MG tablet TAKE 1 TABLET BY MOUTH EVERY DAY. 11/09/23  Yes [name], NP  calcium  carbonate (TUMS - DOSED IN MG ELEMENTAL CALCIUM ) 500 MG chewable tablet Chew 1 tablet by mouth as needed for indigestion or heartburn. Per patient taking every 2 days   Yes [provider]  ferrous sulfate 324 MG TBEC Take 324 mg by mouth daily.   Yes [provider]  isosorbide  mononitrate (IMDUR ) 30 MG 24 hr tablet TAKE 1 TABLET BY MOUTH EVERY EVENING 03/11/24  Yes [name], [name], DO  levothyroxine  (SYNTHROID , LEVOTHROID) 88 MCG tablet Take 88 mcg  by mouth daily before breakfast.   Yes [provider]  losartan  (COZAAR ) 50 MG tablet Take 1 tablet (50 mg total) by mouth daily. 11/09/23  Yes [name], NP  Multiple Vitamin (MULTI VITAMIN DAILY) TABS Take 2 tablets by mouth daily.    Yes [provider]  nitroGLYCERIN  (NITROSTAT ) 0.4 MG SL tablet Place 1 tablet (0.4 mg total) under the tongue every 5 (five) minutes as needed for chest pain. 10/19/22  Yes Conte, Tessa N, PA-C  predniSONE  (DELTASONE ) 5 MG tablet Take 5 mg by mouth daily.   Yes [provider]  rosuvastatin   (CRESTOR ) 10 MG tablet Take 1 tablet by mouth 2 times a week. 11/09/23  Yes [name], NP  sertraline (ZOLOFT) 25 MG tablet Take 25 mg by mouth daily. 02/15/24  Yes [provider]  sodium chloride  (MURO 128) 5 % ophthalmic solution Place 1 drop into the right eye every 4 (four) hours as needed for eye irritation.   Yes [provider]  torsemide  (DEMADEX ) 20 MG tablet Take 1 tablet (20 mg total) by mouth in the morning. 03/11/24  Yes [name], [name], DO  triamcinolone cream (KENALOG) 0.1 % Apply topically as needed.   Yes [provider]  potassium chloride  SA (K-DUR) 20 MEQ tablet Take 1 tablet (20 mEq total) by mouth daily. Patient not taking: Reported on 03/11/2024 05/09/19   Arty Binning, MD     Vitals:   03/25/24 0153 03/25/24 0500 03/25/24 0619 03/25/24 0814  BP: (!) 89/50  (!) 96/55 (!) 99/47  Pulse: 75  83 80  Resp: 18  16 16   Temp: 97.6 F (36.4 C)  97.6 F (36.4 C) 97.7 F (36.5 C)  TempSrc: Oral  Oral   SpO2: 97%  100% 100%  Weight:  90.6 kg    Height:       Exam Gen alert, no distress, obese, pleasant No rash, cyanosis or gangrene Sclera anicteric, throat red, slightly dry  No jvd or bruits, flat neck veins Chest clear bilat to bases, no rales/ wheezing RRR no MRG Abd obese, soft ntnd no mass or ascites +bs GU defer MS no joint effusions or deformity Ext chronic skin changes and 1+ edema below the knees bilat No hip edema, no other edema Neuro is alert, Ox 3 , nf, no asterixis    Renal-related home meds: Demadex  20 every morning  Losartan  50 daily Atenolol  25 daily Others: Imdur , SL NTG, ASA, Tums, Synthroid , MVI, prednisone , statin, Zoloft, Cater  Date   Creat  eGFR (ml/min) 2018   0.9- 1.21 2019   1.02- 1.59 July 2020  1.23 Oct 2023  0.96- 1.07 51- 58 ml/min 03/24/24  4.48 03/25/24  3.5 > 3.2 > 2.5     UA 5/12 -  large LE, neg prot, many bact, 0-5 rbc, 21-50 wbc/ epis  UNa < 10, UCr 118     Renal US   >> 9- 10 cm  kidneys w/o hydro    CXR - no acute disease    Assessment/ Plan: AKI on CKD 3a - b/l creat 0.9- 1.1 from dec 2024, eGFR 51-58. Creat here was 3.5 on admission in the setting of poor po intake, some vomiting and gen'd weakness for last 2 wks. She is also taking ARB and loop diuretics at home. UA showed pyuria, renal US  neg for obstruction. AKI due to severe vol depletion+ ARB effects+ hypotension. Cont to hold ARB/ diuretic. Improving w/ volume loading, but still looks dry  and is hypotensive.  Will bolus 2 L overnight and cont IVF at 125 cc/hr. Check am cortisol, TSH. Will follow.   ^WBC: possible UTI, UA w/ ^wbc's, vs PNA vs cellulitis. IV abx per pmd.  Chronic lymphedema bilat LE's - not a lot of edema Giant cell arteritis: taking daily po pred 5 mg /d  Hypothyroid BP: pt remains hypotensive, but not symptomatic. Holding ARB, BB and diuretic accordingly.       Larry Poag  MD [facility] 03/25/2024, 1:59 PM  Recent Labs  Lab 03/24/24 1404 03/25/24 0039 03/25/24 0316 03/25/24 1246  HGB 9.6*  --  8.2* 8.3*  ALBUMIN 2.7*  --   --   --   CALCIUM  8.3*   < > 7.5* 7.8*  CREATININE 4.48*   < > 3.27* 2.50*  K 5.5*   < > 4.2 4.0   < > = values in this interval not displayed.   Inpatient medications:  aspirin  EC  81 mg Oral Daily   azithromycin  500 mg Oral QHS   heparin  5,000 Units Subcutaneous Q8H   hydrocortisone sod succinate (SOLU-CORTEF) inj  50 mg Intravenous Q6H   levothyroxine   88 mcg Oral QAC breakfast   rosuvastatin   10 mg Oral Once per day on Tuesday Friday   sertraline  25 mg Oral Daily   sodium chloride  flush  3 mL Intravenous Q12H    cefTRIAXone  (ROCEPHIN )  IV 1 g (03/25/24 0113)   lactated ringers      acetaminophen  **OR** acetaminophen , calcium  carbonate, oxyCODONE, polyethylene glycol, sodium chloride 

## 2024-03-26 DIAGNOSIS — N179 Acute kidney failure, unspecified: Secondary | ICD-10-CM | POA: Diagnosis not present

## 2024-03-26 LAB — URINE CULTURE: Culture: >=100000 — AB

## 2024-03-26 LAB — CBC
HCT: 23.5 % — ABNORMAL LOW (ref 36.0–46.0)
Hemoglobin: 7.4 g/dL — ABNORMAL LOW (ref 12.0–15.0)
MCH: 28.2 pg (ref 26.0–34.0)
MCHC: 31.5 g/dL (ref 30.0–36.0)
MCV: 89.7 fL (ref 80.0–100.0)
Platelets: 287 10*3/uL (ref 150–400)
RBC: 2.62 MIL/uL — ABNORMAL LOW (ref 3.87–5.11)
RDW: 17.2 % — ABNORMAL HIGH (ref 11.5–15.5)
WBC: 11.9 10*3/uL — ABNORMAL HIGH (ref 4.0–10.5)
nRBC: 0 % (ref 0.0–0.2)

## 2024-03-26 LAB — TSH: TSH: 1.748 u[IU]/mL (ref 0.350–4.500)

## 2024-03-26 LAB — RENAL FUNCTION PANEL
Albumin: 2 g/dL — ABNORMAL LOW (ref 3.5–5.0)
Anion gap: 11 (ref 5–15)
BUN: 122 mg/dL — ABNORMAL HIGH (ref 8–23)
CO2: 13 mmol/L — ABNORMAL LOW (ref 22–32)
Calcium: 7.6 mg/dL — ABNORMAL LOW (ref 8.9–10.3)
Chloride: 117 mmol/L — ABNORMAL HIGH (ref 98–111)
Creatinine, Ser: 2.3 mg/dL — ABNORMAL HIGH (ref 0.44–1.00)
GFR, Estimated: 20 mL/min — ABNORMAL LOW (ref >60)
Glucose, Bld: 137 mg/dL — ABNORMAL HIGH (ref 70–99)
Phosphorus: 5.8 mg/dL — ABNORMAL HIGH (ref 2.5–4.6)
Potassium: 4.4 mmol/L (ref 3.5–5.1)
Sodium: 141 mmol/L (ref 135–145)

## 2024-03-26 LAB — CORTISOL-AM, BLOOD: Cortisol - AM: 62.9 ug/dL — ABNORMAL HIGH (ref 6.7–22.6)

## 2024-03-26 MED ORDER — MIDODRINE HCL 5 MG PO TABS
5.0000 mg | ORAL_TABLET | Freq: Three times a day (TID) | ORAL | Status: DC
  Administered 2024-03-26 – 2024-03-29 (×8): 5 mg via ORAL
  Filled 2024-03-26 (×10): qty 1

## 2024-03-26 MED ORDER — CEFADROXIL 500 MG PO CAPS
500.0000 mg | ORAL_CAPSULE | Freq: Every day | ORAL | Status: DC
  Administered 2024-03-26 – 2024-03-28 (×3): 500 mg via ORAL
  Filled 2024-03-26 (×3): qty 1

## 2024-03-26 MED ORDER — SODIUM BICARBONATE 650 MG PO TABS
650.0000 mg | ORAL_TABLET | Freq: Two times a day (BID) | ORAL | Status: DC
  Administered 2024-03-26 – 2024-03-29 (×7): 650 mg via ORAL
  Filled 2024-03-26 (×7): qty 1

## 2024-03-26 MED ORDER — STERILE WATER FOR INJECTION IV SOLN
INTRAVENOUS | Status: DC
  Filled 2024-03-26 (×2): qty 150

## 2024-03-26 MED ORDER — HYDROCORTISONE 1 % EX CREA
TOPICAL_CREAM | Freq: Two times a day (BID) | CUTANEOUS | Status: DC
  Administered 2024-03-26 – 2024-03-27 (×3): 1 via TOPICAL
  Filled 2024-03-26: qty 28

## 2024-03-26 NOTE — Progress Notes (Signed)
 PROGRESS NOTE  Jody Shaw  ZOX:[id number] DOB: 13-Aug-1941 DOA: 03/24/2024 PCP: Rosslyn Coons, MD  Consultants  Brief Narrative: 83 y.o. female with known CKD stage III, chronic bilateral lymphedema, giant cell arteritis, hypothyroidism who presents with worsening lymphedema and leg pain as well as increasing weakness and decreasing appetite over the past week.  Did have a fall at home attributed to leaking of her lymphedema causing her some ox to become wet and sleeping.  Was on the ground for about 3 hours if not a bit longer.  Brought to the ER for the same.  Assessment & Plan: * AKI (acute kidney injury) [facility]) -Nephrology consulted by admitting physician.  Appreciate input. -AKI secondary to severe volume depletion plus ARB plus hypotension. - Creatinine downtrending nicely  -Continue IV fluids and oral hydration.  Trend creatinine - IV fluids changed to bicarb at 65 cc/h today.  UTI: - E coli grown, resistant only to ampicillin - started on duracef, transitioned from CTX   Anxiety Chronic, continue with sertraline Actually much improved today   Lymphedema At this time patient is felt to be dehydrated.  Hold torsemide  -Chronic.  Greatly appreciate wound input. - Redness is greatly abated from admitting picture. - Question of possible cellulitis she is on antibiotics. -ABIs pending.  Does have some history of rest pain  Metabolic acidosis, non-anion gap: - noted, infection improving - Likely from chronic kidney disease    Leukocytosis -Question of lower extremity cellulitis versus UTI.  Plan to continue antibiotics for now and trend white blood cells.   Giant cell arteritis (HCC) -Chronic prednisone . - Started on stress dose hydrocortisone due to hypotension on admission. - Not symptomatic.  Continue to follow vitals.   Hypothyroidism Continue with Synthroid  -TSH in a.m.   GERD (gastroesophageal reflux disease) Continue with Tums   CAD (coronary artery  disease) C/w aspirin . Hold tenormin  and imdur  due to BP. C.w. crestor .   Essential hypertension Hold atenolol  due to soft BP as above. Hold imdur . Hold losartan   DVT prophylaxis:  heparin injection 5,000 Units Start: 03/25/24 0600 SCDs Start: 03/24/24 1856  Code Status:   Code Status: Do not attempt resuscitation (DNR) PRE-ARREST INTERVENTIONS DESIRED Family Communication: Daughter at bedside Level of care: Med-Surg Status is: Inpatient Dispo: Pending resolution of AKI.   Consults called: Nephrology  Subjective: Better spirits today than she has been.  No complaints.  Objective: Vitals:   03/25/24 0814 03/25/24 1508 03/25/24 2200 03/26/24 0617  BP: (!) 99/47  94/61 (!) 88/52  Pulse: 80  78 63  Resp: 16  16 18   Temp: 97.7 F (36.5 C)  98 F (36.7 C) (!) 97.5 F (36.4 C)  TempSrc:   Oral Oral  SpO2: 100%  98% 98%  Weight:  86.3 kg    Height:        Intake/Output Summary (Last 24 hours) at 03/26/2024 0755 Last data filed at 03/26/2024 0617 Gross per 24 hour  Intake 3151.01 ml  Output 1850 ml  Net 1301.01 ml   Filed Weights   03/24/24 1900 03/25/24 0500 03/25/24 1508  Weight: 93 kg 90.6 kg 86.3 kg   Body mass index is 34.8 kg/m.  Gen: 83 y.o. female in no apparent distress.  Nontoxic Pulm: Non-labored breathing.  Clear to auscultation bilaterally.  CV: Regular rate and rhythm. No murmur, rub, or gallop. No JVD GI: Abdomen soft, non-tender, non-distended, obese Ext: Warm, with chronic skin changes. Skin: Does have some mild erythema bilateral pretibial shins Neuro: Alert and  oriented. No focal neurological deficits. Psych: Calm  Judgement and insight appear normal. Mood & affect appropriate.     I have personally reviewed the following labs and images: CBC: Recent Labs  Lab 03/24/24 1404 03/25/24 0316 03/25/24 1246 03/26/24 0355  WBC 19.7* 16.3* 13.8* 11.9*  NEUTROABS 16.9*  --   --   --   HGB 9.6* 8.2* 8.3* 7.4*  HCT 28.8* 25.4* 26.1* 23.5*  MCV  86.2 90.1 89.7 89.7  PLT 345 291 301 287   BMP &GFR Recent Labs  Lab 03/24/24 1404 03/25/24 0039 03/25/24 0316 03/25/24 1246 03/26/24 0355  NA 131* 132* 133* 137 141  K 5.5* 4.0 4.2 4.0 4.4  CL 102 106 108 111 117*  CO2 11* 11* 13* 12* 13*  GLUCOSE 109* 114* 126* 140* 137*  BUN 169* 161* 163* 140* 122*  CREATININE 4.48* 3.59* 3.27* 2.50* 2.30*  CALCIUM  8.3* 7.7* 7.5* 7.8* 7.6*  PHOS  --   --   --   --  5.8*   Estimated Creatinine Clearance: 18.2 mL/min (A) (by C-G formula based on SCr of 2.3 mg/dL (H)). Liver & Pancreas: Recent Labs  Lab 03/24/24 1404 03/26/24 0355  AST 30  --   ALT 20  --   ALKPHOS 66  --   BILITOT 0.9  --   PROT 7.6  --   ALBUMIN 2.7* 2.0*   No results for input(s): "LIPASE", "AMYLASE" in the last 168 hours. No results for input(s): "AMMONIA" in the last 168 hours. Diabetic: No results for input(s): "HGBA1C" in the last 72 hours. No results for input(s): "GLUCAP" in the last 168 hours. Cardiac Enzymes: Recent Labs  Lab 03/25/24 0039 03/25/24 1246  CKTOTAL 422* 268*   No results for input(s): "PROBNP" in the last 8760 hours. Coagulation Profile: Recent Labs  Lab 03/25/24 0316  INR 1.5*   Thyroid Function Tests: Recent Labs    03/26/24 0355  TSH 1.748   Lipid Profile: No results for input(s): "CHOL", "HDL", "LDLCALC", "TRIG", "CHOLHDL", "LDLDIRECT" in the last 72 hours. Anemia Panel: Recent Labs    03/25/24 1246  TIBC 176*  IRON 38   Urine analysis:    Component Value Date/Time   COLORURINE YELLOW 03/24/2024 1611   APPEARANCEUR CLOUDY (A) 03/24/2024 1611   LABSPEC 1.012 03/24/2024 1611   PHURINE 5.0 03/24/2024 1611   GLUCOSEU NEGATIVE 03/24/2024 1611   HGBUR MODERATE (A) 03/24/2024 1611   BILIRUBINUR NEGATIVE 03/24/2024 1611   KETONESUR NEGATIVE 03/24/2024 1611   PROTEINUR NEGATIVE 03/24/2024 1611   NITRITE NEGATIVE 03/24/2024 1611   LEUKOCYTESUR LARGE (A) 03/24/2024 1611   Sepsis Labs: Invalid input(s):  "PROCALCITONIN", "LACTICIDVEN"  Microbiology: Recent Results (from the past 240 hours)  Resp panel by RT-PCR (RSV, Flu A&B, Covid) Anterior Nasal Swab     Status: None   Collection Time: 03/24/24  2:04 PM   Specimen: Anterior Nasal Swab  Result Value Ref Range Status   SARS Coronavirus 2 by RT PCR NEGATIVE NEGATIVE Final    Comment: (NOTE) SARS-CoV-2 target nucleic acids are NOT DETECTED.  The SARS-CoV-2 RNA is generally detectable in upper respiratory specimens during the acute phase of infection. The lowest concentration of SARS-CoV-2 viral copies this assay can detect is 138 copies/mL. A negative result does not preclude SARS-Cov-2 infection and should not be used as the sole basis for treatment or other patient management decisions. A negative result may occur with  improper specimen collection/handling, submission of specimen other than nasopharyngeal swab, presence of  viral mutation(s) within the areas targeted by this assay, and inadequate number of viral copies(<138 copies/mL). A negative result must be combined with clinical observations, patient history, and epidemiological information. The expected result is Negative.  Fact Sheet for Patients:  BloggerCourse.com  Fact Sheet for Healthcare Providers:  SeriousBroker.it  This test is no t yet approved or cleared by the [place]  FDA and  has been authorized for detection and/or diagnosis of SARS-CoV-2 by FDA under an Emergency Use Authorization (EUA). This EUA will remain  in effect (meaning this test can be used) for the duration of the COVID-19 declaration under Section 564(b)(1) of the Act, 21 U.S.C.section 360bbb-3(b)(1), unless the authorization is terminated  or revoked sooner.       Influenza A by PCR NEGATIVE NEGATIVE Final   Influenza B by PCR NEGATIVE NEGATIVE Final    Comment: (NOTE) The Xpert Xpress SARS-CoV-2/FLU/RSV plus assay is intended as an  aid in the diagnosis of influenza from Nasopharyngeal swab specimens and should not be used as a sole basis for treatment. Nasal washings and aspirates are unacceptable for Xpert Xpress SARS-CoV-2/FLU/RSV testing.  Fact Sheet for Patients: BloggerCourse.com  Fact Sheet for Healthcare Providers: SeriousBroker.it  This test is not yet approved or cleared by the [place]  FDA and has been authorized for detection and/or diagnosis of SARS-CoV-2 by FDA under an Emergency Use Authorization (EUA). This EUA will remain in effect (meaning this test can be used) for the duration of the COVID-19 declaration under Section 564(b)(1) of the Act, 21 U.S.C. section 360bbb-3(b)(1), unless the authorization is terminated or revoked.     Resp Syncytial Virus by PCR NEGATIVE NEGATIVE Final    Comment: (NOTE) Fact Sheet for Patients: BloggerCourse.com  Fact Sheet for Healthcare Providers: SeriousBroker.it  This test is not yet approved or cleared by the [place]  FDA and has been authorized for detection and/or diagnosis of SARS-CoV-2 by FDA under an Emergency Use Authorization (EUA). This EUA will remain in effect (meaning this test can be used) for the duration of the COVID-19 declaration under Section 564(b)(1) of the Act, 21 U.S.C. section 360bbb-3(b)(1), unless the authorization is terminated or revoked.  Performed at [facility], [address], [place], [place] [address]   Urine Culture (for pregnant, neutropenic or urologic patients or patients with an indwelling urinary catheter)     Status: Abnormal (Preliminary result)   Collection Time: 03/24/24  4:11 PM   Specimen: In/Out Cath Urine  Result Value Ref Range Status   Specimen Description   Final    IN/OUT CATH URINE Performed at [facility], [address] [place], [place] [address]     Special Requests   Final    NONE Performed at [facility], [address] [place], [place] [address]    Culture (A)  Final    >=100,000 COLONIES/mL GRAM NEGATIVE RODS IDENTIFICATION AND SUSCEPTIBILITIES TO FOLLOW Performed at [facility], [address] [place], [place] [address]    Report Status PENDING  Incomplete    Radiology Studies: VAS US  ABI WITH/WO TBI Result Date: 03/25/2024  LOWER EXTREMITY DOPPLER STUDY Patient Name:  EMMELYN LEIBROCK  Date of Exam:   03/25/2024 Medical Rec #: [id number]         Accession #:    [id number] Date of Birth: [birth year]         Patient Gender: F Patient Age:   32 years Exam Location:  [facility] Procedure:  VAS US  ABI WITH/WO TBI Referring Phys: --------------------------------------------------------------------------------  Indications: Rest pain. High Risk Factors: Hypertension, hyperlipidemia, past history of smoking,                    coronary artery disease. Other Factors: Lymphedema.  Limitations: Today's exam was limited due to an open wound and involuntary              patient movement (due to pain). Comparison Study: No previous exams Performing Technologist: Hill, Jody RVT, RDMS  Examination Guidelines: A complete evaluation includes at minimum, Doppler waveform signals and systolic blood pressure reading at the level of bilateral brachial, anterior tibial, and posterior tibial arteries, when vessel segments are accessible. Bilateral testing is considered an integral part of a complete examination. Photoelectric Plethysmograph (PPG) waveforms and toe systolic pressure readings are included as required and additional duplex testing as needed. Limited examinations for reoccurring indications may be performed as noted.  ABI Findings: +---------+------------------+-----+----------+----------+ Right    Rt Pressure (mmHg)IndexWaveform  Comment    +---------+------------------+-----+----------+----------+  Brachial 98                     triphasic            +---------+------------------+-----+----------+----------+ PTA                                       open wound +---------+------------------+-----+----------+----------+ DP       233               2.38 monophasic           +---------+------------------+-----+----------+----------+ Great Toe0                 0.00 Absent               +---------+------------------+-----+----------+----------+ +---------+------------------+-----+----------+-------+ Left     Lt Pressure (mmHg)IndexWaveform  Comment +---------+------------------+-----+----------+-------+ Brachial 95                     biphasic          +---------+------------------+-----+----------+-------+ PTA      109               1.11 monophasic        +---------+------------------+-----+----------+-------+ DP       254               2.59 monophasic        +---------+------------------+-----+----------+-------+ Great Toe0                 0.00 Absent            +---------+------------------+-----+----------+-------+  Arterial wall calcification precludes accurate ankle pressures and ABIs.  Summary: Right: Resting right ankle-brachial index indicates noncompressible right lower extremity arteries. The right toe-brachial index is absent. Arterial Doppler waveforms at the ankle suggest some component of arterial occlusive disease. Left: Resting left ankle-brachial index indicates noncompressible left lower extremity arteries. The left toe-brachial index is absent. Arterial Doppler waveforms at the ankle suggest some component of arterial occlusive disease. *See table(s) above for measurements and observations.     Preliminary     Scheduled Meds:  aspirin  EC  81 mg Oral Daily   azithromycin  500 mg Oral QHS   heparin  5,000 Units Subcutaneous Q8H   hydrocortisone sod succinate (SOLU-CORTEF) inj  50 mg Intravenous Q6H   levothyroxine   88 mcg Oral  QAC  breakfast   rosuvastatin   10 mg Oral Once per day on Tuesday Friday   sertraline  25 mg Oral Daily   sodium chloride  flush  3 mL Intravenous Q12H   Continuous Infusions:  cefTRIAXone  (ROCEPHIN )  IV 1 g (03/25/24 2031)   lactated ringers        LOS: 2 days   35 minutes with more than 50% spent in reviewing records, counseling patient/family and coordinating care.  Trenton Frock, MD [facility] [email or web address] 03/26/2024, 7:55 AM

## 2024-03-26 NOTE — Consult Note (Signed)
 WOC follow up on need for Unnas' boots, ABIs resulted would not use compression in this patient due to non compressible vessels and component of arterial disease present.   Updated orders accordingly.   Provided Lymphedema resources   Amar Sippel [facility], CNS, [place] [phone]

## 2024-03-26 NOTE — [place] FL2 (Signed)
 [place]  MEDICAID FL2 LEVEL OF CARE FORM     IDENTIFICATION  Patient Name: Jody Shaw Birthdate: 1941-07-01 Sex: female Admission Date (Current Location): 03/24/2024  [facility] and [place] Number:  Producer, television/film/video and Address:  [facility],  [address] 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   Difficulty swallowing 07/31/2017   Hypokalemia 07/31/2017   Giant cell arteritis (HCC) 07/31/2017   GERD (gastroesophageal reflux disease)    Hypothyroidism    Essential hypertension 11/24/2014   CAD (coronary artery disease) 11/24/2014   Hyperlipidemia 11/24/2014    Orientation RESPIRATION BLADDER Height & Weight     Self, Time, Situation, Place  Normal Incontinent, External catheter Weight: 86.3 kg Height:  5\' 2"  (157.5 cm)  BEHAVIORAL SYMPTOMS/MOOD NEUROLOGICAL BOWEL NUTRITION STATUS      Continent Diet (renal/carb modified)  AMBULATORY STATUS COMMUNICATION OF NEEDS Skin   Extensive Assist Verbally Other (Comment) (Eccyosis bilateral arms, Erythema bilat buttocks and groin, bilateral left edema/weeping)                       Personal Care Assistance Level of Assistance  Bathing, Feeding, Dressing Bathing Assistance: Limited assistance Feeding assistance: Limited assistance Dressing Assistance: Limited assistance     Functional Limitations Info  Sight, Hearing, Speech Sight  Info: Adequate Hearing Info: Impaired Speech Info: Adequate    SPECIAL CARE FACTORS FREQUENCY  PT (By licensed PT), OT (By licensed OT)     PT Frequency: 5x/wk OT Frequency: 5x/wk            Contractures Contractures Info: Not present    Additional Factors Info  Code Status, Allergies, Psychotropic Code Status Info: DNR Allergies Info: Iodine, Septra (Sulfamethoxazole-trimethoprim), Sulfa Antibiotics, Watermelon (Citrullus Vulgaris Psychotropic Info: Zoloft 25mg  po daily         Current Medications (03/26/2024):  This is the current hospital active medication list Current Facility-Administered Medications  Medication Dose Route Frequency Provider Last Rate Last Admin   acetaminophen  (TYLENOL ) tablet 650 mg  650 mg Oral Q6H PRN Bennie Brave, MD   650 mg at 03/25/24 5621   Or   acetaminophen  (TYLENOL ) suppository 650 mg  650 mg Rectal Q6H PRN Bennie Brave, MD       aspirin  EC tablet 81 mg  81 mg Oral Daily Goel, Hersh, MD   81 mg at 03/26/24 1008   azithromycin (ZITHROMAX) tablet 500 mg  500 mg Oral QHS Waylan Haggard, RPH   500 mg at 03/25/24 2203   calcium  carbonate (TUMS - dosed in mg elemental calcium ) chewable tablet 200 mg of elemental calcium   1 tablet Oral BID PRN Bennie Brave, MD       cefTRIAXone  (ROCEPHIN ) 1 g in sodium chloride  0.9 % 100 mL IVPB  1 g Intravenous Q24H Bennie Brave, MD 200 mL/hr at 03/25/24 2031 1 g at 03/25/24 2031   heparin injection  5,000 Units  5,000 Units Subcutaneous Q8H Goel, Hersh, MD   5,000 Units at 03/26/24 0443   hydrocortisone sodium succinate (SOLU-CORTEF) 100 MG injection 50 mg  50 mg Intravenous Q6H Trenton Frock, MD   50 mg at 03/26/24 0440   lactated ringers  bolus 1,000 mL  1,000 mL Intravenous Once Bennie Brave, MD       levothyroxine  (SYNTHROID ) tablet 88 mcg  88 mcg Oral QAC breakfast Bennie Brave, MD   88 mcg at 03/26/24 0440   oxyCODONE (Oxy IR/ROXICODONE) immediate release tablet 5 mg  5 mg Oral Q6H PRN Trenton Frock, MD   5  mg at 03/26/24 1143   polyethylene glycol (MIRALAX / GLYCOLAX) packet 17 g  17 g Oral Daily PRN Bennie Brave, MD       rosuvastatin  (CRESTOR ) tablet 10 mg  10 mg Oral Once per day on Tuesday Friday Bennie Brave, MD   10 mg at 03/25/24 1610   sertraline (ZOLOFT) tablet 25 mg  25 mg Oral Daily Goel, Hersh, MD   25 mg at 03/26/24 1008   sodium chloride  (MURO 128) 5 % ophthalmic solution 1 drop  1 drop Right Eye Q4H PRN Bennie Brave, MD       sodium chloride  flush (NS) 0.9 % injection 3 mL  3 mL Intravenous Q12H Bennie Brave, MD   3 mL at 03/26/24 1008     Discharge Medications: Please see discharge summary for a list of discharge medications.  Relevant Imaging Results:  Relevant Lab Results:   Additional Information SSN: [id number]  Bari Leys, RN

## 2024-03-26 NOTE — TOC Progression Note (Addendum)
 Transition of Care [facility]) - Progression Note    Patient Details  Name: Jody Shaw MRN: [id number] Date of Birth: 1941-07-04  Transition of [facility] CM/SW Contact  Bari Leys, RN Phone Number: 03/26/2024, 10:04 AM  Clinical Narrative:   PT eval completed, recommendation short term rehab/SNF, call to pt's dtr, Jody Shaw, no answer, vmml with NCM name and phone requesting call back.    -11:29am Call received from [facility], pt's dtr, reviewed PT recommendation for short term rehab, agreeable, prefers SNF in [place]. FL2 updated. Level 2 PASRR pending, faxed out for bed offers.     Expected Discharge Plan: Skilled Nursing Facility Barriers to Discharge: Continued Medical Work up  Expected Discharge Plan and Services       Living arrangements for the past 2 months: Single Family Home                                       Social Determinants of Health (SDOH) Interventions SDOH Screenings   Food Insecurity: No Food Insecurity (03/24/2024)  Housing: Low Risk  (03/24/2024)  Transportation Needs: No Transportation Needs (03/24/2024)  Utilities: Not At Risk (03/24/2024)  Financial Resource Strain: Low Risk  (10/16/2023)   Received from [facility]  Tobacco Use: Medium Risk (03/25/2024)    Readmission Risk Interventions    03/25/2024    2:11 PM  Readmission Risk Prevention Plan  Transportation Screening Complete  PCP or Specialist Appt within 5-7 Days Complete  Home Care Screening Complete  Medication Review (RN CM) Complete

## 2024-03-26 NOTE — Progress Notes (Addendum)
 [facility] Progress Note  Subjective:  5 L in yest and 2000 ml UOP Creat down to 2.3 today, BUN down to 122 Pt is alert, HOH, no c/o's  Vitals:   03/25/24 0814 03/25/24 1508 03/25/24 2200 03/26/24 0617  BP: (!) 99/47  94/61 (!) 88/52  Pulse: 80  78 63  Resp: 16  16 18   Temp: 97.7 F (36.5 C)  98 F (36.7 C) (!) 97.5 F (36.4 C)  TempSrc:   Oral Oral  SpO2: 100%  98% 98%  Weight:  86.3 kg    Height:        Exam: Gen alert, no distress, obese, pleasant No jvd or bruits, flat neck veins Chest clear bilat to bases RRR no MRG Abd obese, soft ntnd no mass or ascites +bs Ext chronic skin changes and 1+ chronic edema below the knees Neuro is alert, Ox 3 , nf, no asterixis      Renal-related home meds: Demadex  20 every morning  Losartan  50 daily Atenolol  25 daily Others: Imdur , SL NTG, ASA, Tums, Synthroid , MVI, prednisone , statin, Zoloft, Cater   Date                             Creat               eGFR (ml/min) 2018                            0.9- 1.21 2019                            1.02- 1.59 July 2020                     1.23 Oct 2023                     0.96- 1.07        51- 58 ml/min 03/24/24                        4.48 03/25/24                        3.5 > 3.2 > 2.5       UA 5/12 -  large LE, neg prot, many bact, 0-5 rbc, 21-50 wbc/ epis  UNa < 10, UCr 118     Renal US   >> 9- 10 cm kidneys w/o hydro    CXR - no acute disease       Assessment/ Plan: AKI on CKD 3a - b/l creat 0.9- 1.1 from dec 2024, eGFR 51-58. Creat here was 3.5 on admission in the setting of poor po intake, some vomiting and gen'd weakness for last 2 wks. She was on ARB and loop diuretics as well. UA showed pyuria, renal US  neg for obstruction. AKI due to severe vol depletion+ ARB effects+ hypotension. Continue to hold ARB/ diuretic. Gave 2 L bolus overnight. Cortisol/ TSH were wnl.  Changing IVF's to bicarb at 65 cc/ hr.  Creat down slightly today. Will follow.  Metabolic  acidosis: will add sod bicarb tabs 2 daily. IVF's changed to bicarb gtt.  ^WBC: possible UTI, UA w/ ^wbc's vs PNA vs cellulitis. IV abx per pmd.  Chronic lymphedema bilat LE's - not a lot of actual edema  Giant cell arteritis: taking daily po pred 5 mg /d  Hypothyroid BP: pt remains hypotensive, but not symptomatic. Holding ARB, BB and diuretic accordingly. Will add midodrine 5 tid.    Larry Poag MD  [facility] 03/26/2024, 1:25 PM  Recent Labs  Lab 03/24/24 1404 03/25/24 0039 03/25/24 1246 03/26/24 0355  HGB 9.6*   < > 8.3* 7.4*  ALBUMIN 2.7*  --   --  2.0*  CALCIUM  8.3*   < > 7.8* 7.6*  PHOS  --   --   --  5.8*  CREATININE 4.48*   < > 2.50* 2.30*  K 5.5*   < > 4.0 4.4   < > = values in this interval not displayed.   Recent Labs  Lab 03/25/24 1246  IRON 38  TIBC 176*   Inpatient medications:  aspirin  EC  81 mg Oral Daily   azithromycin  500 mg Oral QHS   heparin  5,000 Units Subcutaneous Q8H   hydrocortisone sod succinate (SOLU-CORTEF) inj  50 mg Intravenous Q6H   levothyroxine   88 mcg Oral QAC breakfast   rosuvastatin   10 mg Oral Once per day on Tuesday Friday   sertraline  25 mg Oral Daily   sodium chloride  flush  3 mL Intravenous Q12H    cefTRIAXone  (ROCEPHIN )  IV 1 g (03/25/24 2031)   lactated ringers      acetaminophen  **OR** acetaminophen , calcium  carbonate, oxyCODONE, polyethylene glycol, sodium chloride 

## 2024-03-26 NOTE — Plan of Care (Signed)
  Problem: Clinical Measurements: Goal: Will remain free from infection Outcome: Progressing Goal: Cardiovascular complication will be avoided Outcome: Progressing   Problem: Nutrition: Goal: Adequate nutrition will be maintained Outcome: Progressing

## 2024-03-26 NOTE — TOC PASRR Note (Signed)
 30 Day PASRR Note   Patient Details  Name: Jody Shaw Date of Birth: [birth year]   Transition of Care [facility]) CM/SW Contact:    Bari Leys, RN Phone Number: 03/26/2024, 2:22 PM  To Whom It May Concern:  Please be advised that this patient will require a short-term nursing home stay - anticipated 30 days or less for rehabilitation and strengthening.   The plan is for return home.

## 2024-03-26 NOTE — Progress Notes (Signed)
 Physical Therapy Treatment Patient Details Name: Jody Shaw MRN: [id number] DOB: 05-22-1941 Today's Date: 03/26/2024   History of Present Illness 83 yo female presents to therapy following hospital admission on 03/24/2024 due to mechanical fall in which pt struck head and reportedly on floor for 2-3hrs. Pt indicates progressive weakness, poor PO intake, SOB and cough. Pt has chronic LE lymphedema. Pt was found to have AKI with elevated CK and troponin levels. Head CT [facility]. 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    PT Comments   Pt admitted with above diagnosis.  Pt currently with functional limitations due to the deficits listed below (see PT Problem List). Pt in bed when therapist arrived. Pt agreeable to therapy intervention and indicated decreased B LE pain now that they are wrapped. Pt demonstrated increased IND and motivation to participate with PT today and progressed with transfer training tasks. Pt required mod A x 2 for supine to sit with use of bed pad to complete to EOB, mod A x 2 for sit to stand  from EOB with posterior LOB with initial trial, mod A x 2 with A for balance and RW management for SPT bed to recliner. Pt left in recliner, use of pillows to elevate B LE and all needs in place.  Patient will benefit from continued inpatient follow up therapy, <3 hours/day. Pt will benefit from acute skilled PT to increase their independence and safety with mobility to allow discharge.      If plan is discharge home, recommend the following: Two people to help with walking and/or transfers;Two people to help with bathing/dressing/bathroom;Assistance with cooking/housework;Assist for transportation;Help with stairs or ramp for entrance   Can travel by private vehicle        Equipment Recommendations  None recommended by PT    Recommendations for Other Services       Precautions / Restrictions  Precautions Precautions: Fall Restrictions Weight Bearing Restrictions Per Provider Order: No     Mobility  Bed Mobility Overal bed mobility: Needs Assistance Bed Mobility: Supine to Sit Rolling: Mod assist   Supine to sit: HOB elevated, Used rails, Mod assist     General bed mobility comments: increased time and cues improved IND and engagement with mobiltiy tasks today    Transfers Overall transfer level: Needs assistance Equipment used: Rolling walker (2 wheels) Transfers: Sit to/from Stand, Bed to chair/wheelchair/BSC Sit to Stand: Mod assist, +2 physical assistance, From elevated surface, +2 safety/equipment Stand pivot transfers: Mod assist, +2 physical assistance, +2 safety/equipment, From elevated surface         General transfer comment: min cues and pt pull to stand, A to manage RW for turns and pt reports increased B LE pain with WB    Ambulation/Gait               General Gait Details: NT   Stairs             Wheelchair Mobility     Tilt Bed    Modified Rankin (Stroke Patients Only)       Balance Overall balance assessment: History of Falls, Needs assistance Sitting-balance support: Feet unsupported Sitting balance-Leahy Scale: Fair     Standing balance support: Bilateral upper extremity supported, During functional activity, Reliant on assistive device for balance Standing balance-Leahy Scale: Poor Standing balance comment: min A for safety, cues and facilitation for extension posture and WB through anterior portion of  B feet. pt exhibited posterior LOB with initital standing and guided to return to EOB improved stabiltiy with second trial                            Communication Communication Communication: Impaired Factors Affecting Communication: Hearing impaired  Cognition Arousal: Alert Behavior During Therapy: WFL for tasks assessed/performed                             Following commands:  Intact      Cueing    Exercises      General Comments General comments (skin integrity, edema, etc.): B LE wrapped at time of intervention and noted decreased pain response      Pertinent Vitals/Pain Pain Assessment Pain Assessment: 0-10 Pain Score: 3  Pain Location: B LE (chronic) Pain Descriptors / Indicators: Aching, Constant, Grimacing, Guarding Pain Intervention(s): Limited activity within patient's tolerance, Monitored during session, Repositioned    Home Living                          Prior Function            PT Goals (current goals can now be found in the care plan section) Acute Rehab PT Goals Patient Stated Goal: to be stronger and go home PT Goal Formulation: With patient/family Time For Goal Achievement: 04/08/24 Potential to Achieve Goals: Fair Progress towards PT goals: Progressing toward goals    Frequency    Min 2X/week      PT Plan      Co-evaluation              AM-PAC PT "6 Clicks" Mobility   Outcome Measure  Help needed turning from your back to your side while in a flat bed without using bedrails?: A Lot Help needed moving from lying on your back to sitting on the side of a flat bed without using bedrails?: A Lot Help needed moving to and from a bed to a chair (including a wheelchair)?: A Lot Help needed standing up from a chair using your arms (e.g., wheelchair or bedside chair)?: A Lot Help needed to walk in hospital room?: Total Help needed climbing 3-5 steps with a railing? : Total 6 Click Score: 10    End of Session Equipment Utilized During Treatment: Gait belt Activity Tolerance: Patient limited by pain;Patient limited by fatigue Patient left: in chair;with call bell/phone within reach;with chair alarm set Nurse Communication: Mobility status (A x 2 with RW for transfers) PT Visit Diagnosis: Unsteadiness on feet (R26.81);Other abnormalities of gait and mobility (R26.89);Muscle weakness (generalized)  (M62.81);History of falling (Z91.81);Difficulty in walking, not elsewhere classified (R26.2);Pain Pain - Right/Left:  (B) Pain - part of body: Leg;Ankle and joints of foot     Time: 1459-1516 PT Time Calculation (min) (ACUTE ONLY): 17 min  Charges:    $Therapeutic Activity: 8-22 mins PT General Charges $$ ACUTE PT VISIT: 1 Visit                     Cary Clarks, PT Acute Rehab    Annalee Kiang 03/26/2024, 4:43 PM

## 2024-03-27 DIAGNOSIS — N179 Acute kidney failure, unspecified: Secondary | ICD-10-CM | POA: Diagnosis not present

## 2024-03-27 LAB — RENAL FUNCTION PANEL
Albumin: 2.2 g/dL — ABNORMAL LOW (ref 3.5–5.0)
Anion gap: 12 (ref 5–15)
BUN: 100 mg/dL — ABNORMAL HIGH (ref 8–23)
CO2: 15 mmol/L — ABNORMAL LOW (ref 22–32)
Calcium: 8 mg/dL — ABNORMAL LOW (ref 8.9–10.3)
Chloride: 113 mmol/L — ABNORMAL HIGH (ref 98–111)
Creatinine, Ser: 1.66 mg/dL — ABNORMAL HIGH (ref 0.44–1.00)
GFR, Estimated: 30 mL/min — ABNORMAL LOW (ref >60)
Glucose, Bld: 135 mg/dL — ABNORMAL HIGH (ref 70–99)
Phosphorus: 4.3 mg/dL (ref 2.5–4.6)
Potassium: 3.8 mmol/L (ref 3.5–5.1)
Sodium: 140 mmol/L (ref 135–145)

## 2024-03-27 LAB — CBC
HCT: 24.1 % — ABNORMAL LOW (ref 36.0–46.0)
Hemoglobin: 7.7 g/dL — ABNORMAL LOW (ref 12.0–15.0)
MCH: 28.5 pg (ref 26.0–34.0)
MCHC: 32 g/dL (ref 30.0–36.0)
MCV: 89.3 fL (ref 80.0–100.0)
Platelets: 316 10*3/uL (ref 150–400)
RBC: 2.7 MIL/uL — ABNORMAL LOW (ref 3.87–5.11)
RDW: 17.2 % — ABNORMAL HIGH (ref 11.5–15.5)
WBC: 12.8 10*3/uL — ABNORMAL HIGH (ref 4.0–10.5)
nRBC: 0.2 % (ref 0.0–0.2)

## 2024-03-27 MED ORDER — IPRATROPIUM-ALBUTEROL 0.5-2.5 (3) MG/3ML IN SOLN
3.0000 mL | Freq: Once | RESPIRATORY_TRACT | Status: AC
  Administered 2024-03-27: 3 mL via RESPIRATORY_TRACT
  Filled 2024-03-27: qty 3

## 2024-03-27 MED ORDER — IPRATROPIUM-ALBUTEROL 0.5-2.5 (3) MG/3ML IN SOLN: 3.0000 mL | Freq: Four times a day (QID) | RESPIRATORY_TRACT | Status: DC | PRN

## 2024-03-27 NOTE — Progress Notes (Signed)
 [facility] Progress Note  Subjective:  UOP 1150 yest  Creat down 1.6 today, bun down 100  Vitals:   03/27/24 0456 03/27/24 0500 03/27/24 1117 03/27/24 1143  BP: 95/80   (!) 147/82  Pulse: 75   89  Resp: 18   16  Temp: 98.1 F (36.7 C)   97.7 F (36.5 C)  TempSrc: Oral     SpO2: 97%  96% 96%  Weight:  92.4 kg    Height:        Exam: Gen alert, no distress, obese, pleasant No jvd or bruits, flat neck veins Chest clear bilat to bases RRR no MRG Abd obese, soft ntnd no mass or ascites +bs Ext chronic skin changes and 1+ chronic edema below the knees Neuro is alert, Ox 3 , nf, no asterixis      Renal-related home meds: Demadex  20 every morning  Losartan  50 daily Atenolol  25 daily Others: Imdur , SL NTG, ASA, Tums, Synthroid , MVI, prednisone , statin, Zoloft, Cater   Date                             Creat               eGFR (ml/min) 2018                            0.9- 1.21 2019                            1.02- 1.59 July 2020                     1.23 Oct 2023                     0.96- 1.07        51- 58 ml/min 03/24/24                        4.48 03/25/24                        3.5 > 3.2 > 2.5       UA 5/12 -  large LE, neg prot, many bact, 0-5 rbc, 21-50 wbc/ epis  UNa < 10, UCr 118     Renal US   >> 9- 10 cm kidneys w/o hydro    CXR - no acute disease       Assessment/ Plan: AKI on CKD 3a - b/l creat 0.9- 1.1 from dec 2024, eGFR 51-58. Creat here was 3.5 on admission in the setting of poor po intake, some vomiting and gen'd weakness for last 2 wks. She was on ARB and loop diuretics as well. UA showed pyuria, renal US  neg for obstruction. AKI due to severe vol depletion+ ARB effects+ hypotension. We have cont'd IVF"s and today labs improved to bun 100, creat 1.6. Pt okay for dc from renal standpoint. Recommend: - dc and avoid ARB/ ACEi in the future for this patient, given severe AKI  - any other BP classes are ok - would only take torsemide  "PRN" for  retaining water or wt gain - hopefully is not taking torsemide  for her lymphedema, which will not help - will have [facility] contact her for f/u appt in 3-5 weeks.  - will sign off  Metabolic acidosis:  would cont sod bicarb tabs for 1-2 weeks. Will dc IVF"s.  ^WBC: possible UTI, UA w/ ^wbc's vs PNA vs cellulitis. IV abx per pmd.  Chronic lymphedema bilat LE's - not a lot of actual edema Giant cell arteritis: taking daily po pred 5 mg /d. Getting IV hydrocortisone here due to hypotension.  Hypothyroid BP: low bp's , holding ARB, BB and diuretic accordingly. Getting midodrine 5 tid.    Larry Poag MD  [facility] 03/27/2024, 2:38 PM  Recent Labs  Lab 03/26/24 0355 03/27/24 0332  HGB 7.4* 7.7*  ALBUMIN 2.0* 2.2*  CALCIUM  7.6* 8.0*  PHOS 5.8* 4.3  CREATININE 2.30* 1.66*  K 4.4 3.8   Recent Labs  Lab 03/25/24 1246  IRON 38  TIBC 176*   Inpatient medications:  aspirin  EC  81 mg Oral Daily   cefadroxil  500 mg Oral q1800   heparin  5,000 Units Subcutaneous Q8H   hydrocortisone cream   Topical BID   hydrocortisone sod succinate (SOLU-CORTEF) inj  50 mg Intravenous Q6H   levothyroxine   88 mcg Oral QAC breakfast   midodrine  5 mg Oral TID WC   rosuvastatin   10 mg Oral Once per day on Tuesday Friday   sertraline  25 mg Oral Daily   sodium bicarbonate  650 mg Oral BID   sodium chloride  flush  3 mL Intravenous Q12H    lactated ringers      sodium bicarbonate 150 mEq in sterile water 1,150 mL infusion 65 mL/hr at 03/27/24 1208   acetaminophen  **OR** acetaminophen , calcium  carbonate, ipratropium-albuterol, oxyCODONE, polyethylene glycol, sodium chloride 

## 2024-03-27 NOTE — TOC Progression Note (Addendum)
 Transition of Care [facility]) - Progression Note    Patient Details  Name: Jody Shaw MRN: [id number] Date of Birth: Jul 13, 1941  Transition [facility] Care [facility]) CM/SW Contact  Bari Leys, RN Phone Number: 03/27/2024, 10:09 AM  Clinical Narrative:   Call received from [facility] , admit coord at [facility], extended short term bed offer to patient.  -11:14am [facility] emailed to pt's dtr, Mia Adam, at [email or web address], await facility choice.       Expected Discharge Plan: Skilled Nursing Facility Barriers to Discharge: Continued Medical Work up  Expected Discharge Plan and Services       Living arrangements for the past 2 months: Single Family Home                                       Social Determinants of Health (SDOH) Interventions SDOH Screenings   Food Insecurity: No Food Insecurity (03/24/2024)  Housing: Low Risk  (03/24/2024)  Transportation Needs: No Transportation Needs (03/24/2024)  Utilities: Not At Risk (03/24/2024)  Financial Resource Strain: Low Risk  (10/16/2023)   [facility] from [facility]  Tobacco Use: Medium Risk (03/25/2024)    Readmission Risk Interventions    03/25/2024    2:11 PM  Readmission Risk Prevention Plan  Transportation Screening Complete  PCP or Specialist Appt within 5-7 Days Complete  Home Care Screening Complete  Medication Review (RN CM) Complete

## 2024-03-27 NOTE — Progress Notes (Signed)
 Occupational Therapy Treatment Patient Details Name: Jody Shaw MRN: [id number] DOB: October 19, 1941 Today's Date: 03/27/2024   History of present illness 83 yo female presents to therapy following hospital admission on 03/24/2024 due to mechanical fall in which pt struck head and reportedly on floor for 2-3hrs. Pt indicates progressive weakness, poor PO intake, SOB and cough. Pt has chronic LE lymphedema. Pt was found to have AKI with elevated CK and troponin levels. Head CT [facility]. 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   OT comments  Pt making good progress towards mobility and ADL goals. MOD A +2 for bed mobility and able to direct therapy team to place pillows to reduce hypersensitivity in BLE. Very motivated to participate! Pt stands from elevated bed surface with CGA, step pivot to recliner with MIN A +1 (+2 for line mgmt) using RW, cues for sequencing. Pt left with BLE elevated and NT in room for linen change. Discharge recommendation appropriate, Patient will benefit from continued inpatient follow up therapy, <3 hours/day       If plan is discharge home, recommend the following:  A lot of help with walking and/or transfers;A lot of help with bathing/dressing/bathroom;Assist for transportation;Assistance with cooking/housework;Help with stairs or ramp for entrance   Equipment Recommendations  Other (comment)    Recommendations for Other Services      Precautions / Restrictions Precautions Precautions: Fall Restrictions Weight Bearing Restrictions Per Provider Order: No       Mobility Bed Mobility Overal bed mobility: Needs Assistance Bed Mobility: Supine to Sit Rolling: Mod assist   Supine to sit: HOB elevated, Used rails, Mod assist Sit to supine: Mod assist, +2 for physical assistance, +2 for safety/equipment   General bed mobility comments: increased time, pt able to direct therapy team to place  pillows to ease discomfor and hypersensitivities in BLEs. +2 assist due to BLE mgmt    Transfers Overall transfer level: Needs assistance Equipment used: Rolling walker (2 wheels) Transfers: Sit to/from Stand, Bed to chair/wheelchair/BSC Sit to Stand: Contact guard assist, From elevated surface     Step pivot transfers: Min assist, +2 safety/equipment     General transfer comment: Pt with improved mobility, stands with CGA +1 from elevated bed. +2 for IV pole mgmt, transfers with step pivot using RW and MIN A +1.     Balance Overall balance assessment: History of Falls, Needs assistance Sitting-balance support: Feet unsupported Sitting balance-Leahy Scale: Fair     Standing balance support: Bilateral upper extremity supported, During functional activity, Reliant on assistive device for balance Standing balance-Leahy Scale: Poor Standing balance comment: fatigues quickly, very motivated                           ADL either performed or assessed with clinical judgement   ADL Overall ADL's : Needs assistance/impaired                                     Functional mobility during ADLs: Minimal assistance;Rolling walker (2 wheels) General ADL Comments: Session focused on functional mobility and transfer training     Communication Communication Communication: Impaired Factors Affecting Communication: Hearing impaired   Cognition Arousal: Alert Behavior During Therapy: WFL for tasks assessed/performed Cognition: No apparent impairments  Following commands: Intact        Cueing   Cueing Techniques: Verbal cues, Tactile cues        General Comments On RA, SpO2 98% and HR 99 after transfer. BLE wrapped.    Pertinent Vitals/ Pain       Pain Assessment Pain Assessment: Faces Faces Pain Scale: Hurts even more Pain Location: B LE (chronic) Pain Descriptors / Indicators: Aching, Constant, Grimacing,  Guarding Pain Intervention(s): Limited activity within patient's tolerance, Monitored during session, Repositioned   Frequency  Min 2X/week        Progress Toward Goals  OT Goals(current goals can now be found in the care plan section)  Progress towards OT goals: Progressing toward goals  Acute Rehab OT Goals OT Goal Formulation: With patient Time For Goal Achievement: 04/08/24 Potential to Achieve Goals: Good ADL Goals Pt Will Perform Grooming: with set-up;sitting Pt Will Perform Upper Body Dressing: with set-up;sitting Pt Will Transfer to Toilet: with supervision;ambulating Pt Will Perform Toileting - Clothing Manipulation and hygiene: with supervision;sit to/from stand  Plan         AM-PAC OT "6 Clicks" Daily Activity     Outcome Measure   Help from another person eating meals?: A Little Help from another person taking care of personal grooming?: A Little Help from another person toileting, which includes using toliet, bedpan, or urinal?: A Lot Help from another person bathing (including washing, rinsing, drying)?: A Lot Help from another person to put on and taking off regular upper body clothing?: A Little Help from another person to put on and taking off regular lower body clothing?: Total 6 Click Score: 14    End of Session Equipment Utilized During Treatment: Gait belt;Rolling walker (2 wheels)  OT Visit Diagnosis: Muscle weakness (generalized) (M62.81);History of falling (Z91.81);Pain Pain - part of body: Leg;Knee   Activity Tolerance Patient tolerated treatment well   Patient Left in chair;with call bell/phone within reach;with chair alarm set;with nursing/sitter in room   Nurse Communication Mobility status        Time: 1610-9604 OT Time Calculation (min): 13 min  Charges: OT General Charges $OT Visit: 1 Visit OT Treatments $Therapeutic Activity: 8-22 mins  Modene Andy L. Tunisha Ruland, OTR/L  03/27/24, 2:15 PM

## 2024-03-27 NOTE — Progress Notes (Addendum)
 PROGRESS NOTE  Jody Shaw  MWU:[id number] DOB: [birth year] DOA: 03/24/2024 PCP: Rosslyn Coons, MD  Consultants  Brief Narrative: 83 y.o. female with known CKD stage III, chronic bilateral lymphedema, giant cell arteritis, hypothyroidism who presents with worsening lymphedema and leg pain as well as increasing weakness and decreasing appetite over the past week.  Did have a fall at home attributed to leaking of her lymphedema causing her some ox to become wet and sleeping.  Was on the ground for about 3 hours if not a bit longer.  Brought to the ER for the same.  Assessment & Plan: * AKI (acute kidney injury) [facility]) -Nephrology consulted by admitting physician.  Appreciate input. -AKI secondary to severe volume depletion plus ARB plus hypotension. - Will plan input and discharge summary to avoid ARB/ACE inhibitors in the future given severity of her AKI. - Creatinine downtrending nicely, BUN 100 today. -Hold torsemide  except for documented weight gain  UTI: - E coli grown, resistant only to ampicillin - started on duracef, transitioned from CTX -She overall feels very well today.   Anxiety Chronic, continue with sertraline Actually much improved today.  Feels that she has a Ship broker" outlook on life   Lymphedema At this time patient is felt to be dehydrated.  -Chronic.  Greatly appreciate wound input. - Redness is greatly abated from admitting picture. - Question of possible cellulitis she is on antibiotics, greatly improved regardless. - ABIs unable to be read with noncompressible arteries.   - will get OOB.  Follow-up with vascular surgery outpt  Metabolic acidosis, non-anion gap: - noted, infection improving - Likely from chronic kidney disease  -On oral bicarb currently. - Trending upwards   Leukocytosis -Question of lower extremity cellulitis versus UTI.  Plan to continue antibiotics for now and trend white blood cells. -Most likely etiology for persistent elevation  is ongoing steroid administration.  Wheezing: - Heard on exam today. - Has documented 30-pack-year history of cigarette use.  Would not be surprised if she has some degree of COPD. - DuoNebs today   Giant cell arteritis (HCC) -Chronic prednisone . - Started on stress dose hydrocortisone due to hypotension on admission. - Not symptomatic.  Continue to follow vitals.  These are improving. - Plan will be to start taper off hydrocortisone   Hypothyroidism Continue with Synthroid  TSH normal   GERD (gastroesophageal reflux disease) Continue with Tums   CAD (coronary artery disease) C/w aspirin . Hold tenormin  and imdur  due to BP. C.w. crestor .   Essential hypertension -BPs have started to creep back upwards. - We will need to see at least 1 more normal/elevated blood pressure before restarting any of her home meds.  We will hold her ARB/ACE from here on out.  DVT prophylaxis:  heparin injection 5,000 Units Start: 03/25/24 0600 SCDs Start: 03/24/24 1856  Code Status:   Code Status: Do not attempt resuscitation (DNR) PRE-ARREST INTERVENTIONS DESIRED Family Communication: Daughter at bedside Level of care: Med-Surg Status is: Inpatient Dispo: Pending resolution of AKI and overall improvement.  PT/OT consulted today.   Consults called: Nephrology  Subjective: Better spirits today than she has been.  No complaints.  Objective: Vitals:   03/27/24 0456 03/27/24 0500 03/27/24 1117 03/27/24 1143  BP: 95/80   (!) 147/82  Pulse: 75   89  Resp: 18   16  Temp: 98.1 F (36.7 C)   97.7 F (36.5 C)  TempSrc: Oral     SpO2: 97%  96% 96%  Weight:  92.4 kg  Height:        Intake/Output Summary (Last 24 hours) at 03/27/2024 1453 Last data filed at 03/27/2024 1412 Gross per 24 hour  Intake 1500 ml  Output 1850 ml  Net -350 ml   Filed Weights   03/25/24 0500 03/25/24 1508 03/27/24 0500  Weight: 90.6 kg 86.3 kg 92.4 kg   Body mass index is 37.26 kg/m.  Gen: 83 y.o. female in  no apparent distress.  Nontoxic.  Pleasant and cheerful today.  Somewhat hard of hearing. Pulm: Non-labored breathing.  Does have some fairly notable wheezing on exam today. CV: Regular rate and rhythm. No murmur, rub, or gallop. No JVD GI: Abdomen soft, non-tender, non-distended, obese Ext: Warm, with chronic skin changes bilaterally. Skin: Does have some mild erythema bilateral pretibial shins Neuro: Alert and oriented. No focal neurological deficits. Psych: Calm  Judgement and insight appear normal. Mood & affect appropriate.     I have personally reviewed the following labs and images: CBC: Recent Labs  Lab 03/24/24 1404 03/25/24 0316 03/25/24 1246 03/26/24 0355 03/27/24 0332  WBC 19.7* 16.3* 13.8* 11.9* 12.8*  NEUTROABS 16.9*  --   --   --   --   HGB 9.6* 8.2* 8.3* 7.4* 7.7*  HCT 28.8* 25.4* 26.1* 23.5* 24.1*  MCV 86.2 90.1 89.7 89.7 89.3  PLT 345 291 301 287 316   BMP &GFR Recent Labs  Lab 03/25/24 0039 03/25/24 0316 03/25/24 1246 03/26/24 0355 03/27/24 0332  NA 132* 133* 137 141 140  K 4.0 4.2 4.0 4.4 3.8  CL 106 108 111 117* 113*  CO2 11* 13* 12* 13* 15*  GLUCOSE 114* 126* 140* 137* 135*  BUN 161* 163* 140* 122* 100*  CREATININE 3.59* 3.27* 2.50* 2.30* 1.66*  CALCIUM  7.7* 7.5* 7.8* 7.6* 8.0*  PHOS  --   --   --  5.8* 4.3   Estimated Creatinine Clearance: 26.2 mL/min (A) (by C-G formula based on SCr of 1.66 mg/dL (H)). Liver & Pancreas: Recent Labs  Lab 03/24/24 1404 03/26/24 0355 03/27/24 0332  AST 30  --   --   ALT 20  --   --   ALKPHOS 66  --   --   BILITOT 0.9  --   --   PROT 7.6  --   --   ALBUMIN 2.7* 2.0* 2.2*   No results for input(s): "LIPASE", "AMYLASE" in the last 168 hours. No results for input(s): "AMMONIA" in the last 168 hours. Diabetic: No results for input(s): "HGBA1C" in the last 72 hours. No results for input(s): "GLUCAP" in the last 168 hours. Cardiac Enzymes: Recent Labs  Lab 03/25/24 0039 03/25/24 1246  CKTOTAL 422* 268*    No results for input(s): "PROBNP" in the last 8760 hours. Coagulation Profile: Recent Labs  Lab 03/25/24 0316  INR 1.5*   Thyroid Function Tests: Recent Labs    03/26/24 0355  TSH 1.748   Lipid Profile: No results for input(s): "CHOL", "HDL", "LDLCALC", "TRIG", "CHOLHDL", "LDLDIRECT" in the last 72 hours. Anemia Panel: Recent Labs    03/25/24 1246  TIBC 176*  IRON 38   Urine analysis:    Component Value Date/Time   COLORURINE YELLOW 03/24/2024 1611   APPEARANCEUR CLOUDY (A) 03/24/2024 1611   LABSPEC 1.012 03/24/2024 1611   PHURINE 5.0 03/24/2024 1611   GLUCOSEU NEGATIVE 03/24/2024 1611   HGBUR MODERATE (A) 03/24/2024 1611   BILIRUBINUR NEGATIVE 03/24/2024 1611   KETONESUR NEGATIVE 03/24/2024 1611   PROTEINUR NEGATIVE 03/24/2024 1611  NITRITE NEGATIVE 03/24/2024 1611   LEUKOCYTESUR LARGE (A) 03/24/2024 1611   Sepsis Labs: Invalid input(s): "PROCALCITONIN", "LACTICIDVEN"  Microbiology: Recent Results (from the past 240 hours)  Resp panel by RT-PCR (RSV, Flu A&B, Covid) Anterior Nasal Swab     Status: None   Collection Time: 03/24/24  2:04 PM   Specimen: Anterior Nasal Swab  Result Value Ref Range Status   SARS Coronavirus 2 by RT PCR NEGATIVE NEGATIVE Final    Comment: (NOTE) SARS-CoV-2 target nucleic acids are NOT DETECTED.  The SARS-CoV-2 RNA is generally detectable in upper respiratory specimens during the acute phase of infection. The lowest concentration of SARS-CoV-2 viral copies this assay can detect is 138 copies/mL. A negative result does not preclude SARS-Cov-2 infection and should not be used as the sole basis for treatment or other patient management decisions. A negative result may occur with  improper specimen collection/handling, submission of specimen other than nasopharyngeal swab, presence of viral mutation(s) within the areas targeted by this assay, and inadequate number of viral copies(<138 copies/mL). A negative result must be  combined with clinical observations, patient history, and epidemiological information. The expected result is Negative.  Fact Sheet for Patients:  BloggerCourse.com  Fact Sheet for Healthcare Providers:  SeriousBroker.it  This test is no t yet approved or cleared by the [place]  FDA and  has been authorized for detection and/or diagnosis of SARS-CoV-2 by FDA under an Emergency Use Authorization (EUA). This EUA will remain  in effect (meaning this test can be used) for the duration of the COVID-19 declaration under Section 564(b)(1) of the Act, 21 U.S.C.section 360bbb-3(b)(1), unless the authorization is terminated  or revoked sooner.       Influenza A by PCR NEGATIVE NEGATIVE Final   Influenza B by PCR NEGATIVE NEGATIVE Final    Comment: (NOTE) The Xpert Xpress SARS-CoV-2/FLU/RSV plus assay is intended as an aid in the diagnosis of influenza from Nasopharyngeal swab specimens and should not be used as a sole basis for treatment. Nasal washings and aspirates are unacceptable for Xpert Xpress SARS-CoV-2/FLU/RSV testing.  Fact Sheet for Patients: BloggerCourse.com  Fact Sheet for Healthcare Providers: SeriousBroker.it  This test is not yet approved or cleared by the [place]  FDA and has been authorized for detection and/or diagnosis of SARS-CoV-2 by FDA under an Emergency Use Authorization (EUA). This EUA will remain in effect (meaning this test can be used) for the duration of the COVID-19 declaration under Section 564(b)(1) of the Act, 21 U.S.C. section 360bbb-3(b)(1), unless the authorization is terminated or revoked.     Resp Syncytial Virus by PCR NEGATIVE NEGATIVE Final    Comment: (NOTE) Fact Sheet for Patients: BloggerCourse.com  Fact Sheet for Healthcare Providers: SeriousBroker.it  This test is not yet  approved or cleared by the [place]  FDA and has been authorized for detection and/or diagnosis of SARS-CoV-2 by FDA under an Emergency Use Authorization (EUA). This EUA will remain in effect (meaning this test can be used) for the duration of the COVID-19 declaration under Section 564(b)(1) of the Act, 21 U.S.C. section 360bbb-3(b)(1), unless the authorization is terminated or revoked.  Performed at [facility], [address], [place], [place] [address]   Urine Culture (for pregnant, neutropenic or urologic patients or patients with an indwelling urinary catheter)     Status: Abnormal   Collection Time: 03/24/24  4:11 PM   Specimen: In/Out Cath Urine  Result Value Ref Range Status   Specimen Description   Final  IN/OUT CATH URINE Performed at [facility], [address] [place], [place] [address]    Special Requests   Final    NONE Performed at [facility], [address], [place], [place] [address]    Culture >=100,000 COLONIES/mL ESCHERICHIA COLI (A)  Final   Report Status 03/26/2024 FINAL  Final   Organism ID, Bacteria ESCHERICHIA COLI (A)  Final      Susceptibility   Escherichia coli - MIC*    AMPICILLIN 16 INTERMEDIATE Intermediate     CEFAZOLIN  <=4 SENSITIVE Sensitive     CEFEPIME <=0.12 SENSITIVE Sensitive     CEFTRIAXONE  <=0.25 SENSITIVE Sensitive     CIPROFLOXACIN <=0.25 SENSITIVE Sensitive     GENTAMICIN <=1 SENSITIVE Sensitive     IMIPENEM <=0.25 SENSITIVE Sensitive     NITROFURANTOIN <=16 SENSITIVE Sensitive     TRIMETH/SULFA <=20 SENSITIVE Sensitive     AMPICILLIN/SULBACTAM 4 SENSITIVE Sensitive     PIP/TAZO <=4 SENSITIVE Sensitive ug/mL    * >=100,000 COLONIES/mL ESCHERICHIA COLI  Culture, blood (Routine X 2) w Reflex to ID Panel     Status: None (Preliminary result)   Collection Time: 03/25/24 12:39 AM   Specimen: BLOOD  Result Value Ref Range Status   Specimen Description   Final    BLOOD  BLOOD LEFT ARM Performed at [facility], [address], [place], [place] [address]    Special Requests   Final    BOTTLES DRAWN AEROBIC AND ANAEROBIC Blood Culture results may not be optimal due to an inadequate volume of blood received in culture bottles Performed at [facility], [address] [place], [place] [address]    Culture   Final    NO GROWTH 2 DAYS Performed at [facility], [address] [place], [place] [address]    Report Status PENDING  Incomplete  Culture, blood (Routine X 2) w Reflex to ID Panel     Status: None (Preliminary result)   Collection Time: 03/25/24 12:39 AM   Specimen: BLOOD  Result Value Ref Range Status   Specimen Description   Final    BLOOD BLOOD LEFT HAND Performed at [facility], [address], [place], [place] [address]    Special Requests   Final    BOTTLES DRAWN AEROBIC AND ANAEROBIC Blood Culture results may not be optimal due to an inadequate volume of blood received in culture bottles Performed at [facility], [address] [place], [place] [address]    Culture   Final    NO GROWTH 2 DAYS Performed at [facility], [address] [place], [place] [address]    Report Status PENDING  Incomplete    Radiology Studies: No results found.   Scheduled Meds:  aspirin  EC  81 mg Oral Daily   cefadroxil  500 mg Oral q1800   heparin  5,000 Units Subcutaneous Q8H   hydrocortisone cream   Topical BID   hydrocortisone sod succinate (SOLU-CORTEF) inj  50 mg Intravenous Q6H   levothyroxine   88 mcg Oral QAC breakfast   midodrine  5 mg Oral TID WC   rosuvastatin   10 mg Oral Once per day on Tuesday Friday   sertraline  25 mg Oral Daily   sodium bicarbonate  650 mg Oral BID   sodium chloride  flush  3 mL Intravenous Q12H   Continuous Infusions:  lactated ringers        LOS: 3 days  35 minutes with more than 50% spent in reviewing records,  counseling patient/family and coordinating care.  Trenton Frock, MD [facility] [email or web address] 03/27/2024, 2:53 PM

## 2024-03-28 DIAGNOSIS — N179 Acute kidney failure, unspecified: Secondary | ICD-10-CM | POA: Diagnosis not present

## 2024-03-28 LAB — RENAL FUNCTION PANEL
Albumin: 2.3 g/dL — ABNORMAL LOW (ref 3.5–5.0)
Anion gap: 11 (ref 5–15)
BUN: 83 mg/dL — ABNORMAL HIGH (ref 8–23)
CO2: 21 mmol/L — ABNORMAL LOW (ref 22–32)
Calcium: 7.8 mg/dL — ABNORMAL LOW (ref 8.9–10.3)
Chloride: 111 mmol/L (ref 98–111)
Creatinine, Ser: 1.67 mg/dL — ABNORMAL HIGH (ref 0.44–1.00)
GFR, Estimated: 30 mL/min — ABNORMAL LOW (ref >60)
Glucose, Bld: 137 mg/dL — ABNORMAL HIGH (ref 70–99)
Phosphorus: 3.3 mg/dL (ref 2.5–4.6)
Potassium: 3.7 mmol/L (ref 3.5–5.1)
Sodium: 143 mmol/L (ref 135–145)

## 2024-03-28 LAB — CBC
HCT: 25.1 % — ABNORMAL LOW (ref 36.0–46.0)
Hemoglobin: 8.1 g/dL — ABNORMAL LOW (ref 12.0–15.0)
MCH: 28.8 pg (ref 26.0–34.0)
MCHC: 32.3 g/dL (ref 30.0–36.0)
MCV: 89.3 fL (ref 80.0–100.0)
Platelets: 283 10*3/uL (ref 150–400)
RBC: 2.81 MIL/uL — ABNORMAL LOW (ref 3.87–5.11)
RDW: 17.2 % — ABNORMAL HIGH (ref 11.5–15.5)
WBC: 14 10*3/uL — ABNORMAL HIGH (ref 4.0–10.5)
nRBC: 0.5 % — ABNORMAL HIGH (ref 0.0–0.2)

## 2024-03-28 MED ORDER — HYDROCORTISONE 5 MG PO TABS: 15.0000 mg | ORAL_TABLET | Freq: Two times a day (BID) | ORAL | Status: DC

## 2024-03-28 MED ORDER — HYDROCORTISONE 5 MG PO TABS
15.0000 mg | ORAL_TABLET | Freq: Three times a day (TID) | ORAL | Status: DC
  Administered 2024-03-28 (×2): 15 mg via ORAL
  Filled 2024-03-28 (×3): qty 1

## 2024-03-28 NOTE — Progress Notes (Signed)
 PROGRESS NOTE  Jody Shaw  WGN:[id number] DOB: 02-Apr-1941 DOA: 03/24/2024 PCP: Rosslyn Coons, MD  Consultants  Brief Narrative: 83 y.o. female with known CKD stage III, chronic bilateral lymphedema, giant cell arteritis, hypothyroidism who presents with worsening lymphedema and leg pain as well as increasing weakness and decreasing appetite over the past week.  Did have a fall at home attributed to leaking of her lymphedema causing her some ox to become wet and sleeping.  Was on the ground for about 3 hours if not a bit longer.  Brought to the ER for the same.  Assessment & Plan: * AKI (acute kidney injury) (HCC) -AKI secondary to severe volume depletion plus ARB plus hypotension. - Will plan input and discharge summary to avoid ARB/ACE inhibitors in the future given severity of her AKI. - Creatinine downtrending nicely, BUN 83 today. -Hold torsemide  except for documented weight gain - likely close to her home creatinine, unclear what it was prior to admission  UTI: - E coli grown, resistant only to ampicillin - started on duracef, transitioned from CTX -She overall feels very well today.   Anxiety Chronic, continue with sertraline Actually much improved today.  Continues to improve from overall mood standpoint   Lymphedema At this time patient is felt to be dehydrated.  -Chronic.  Greatly appreciate wound input. - Redness is greatly abated from admitting picture. - Question of possible cellulitis she is on antibiotics, greatly improved regardless. - ABIs unable to be read with noncompressible arteries.   - will get OOB.  Follow-up with vascular surgery outpt  Metabolic acidosis, non-anion gap: - noted, infection improving - Likely from chronic kidney disease  -On oral bicarb currently. - resolving   Leukocytosis -Question of lower extremity cellulitis versus UTI.  Plan to continue antibiotics for now and trend white blood cells. -Most likely etiology for persistent  elevation is ongoing steroid administration.  Wheezing: - Heard on exam today. - Has documented 30-pack-year history of cigarette use.  Would not be surprised if she has some degree of COPD. - DuoNebs today   Giant cell arteritis (HCC) -Chronic prednisone . - Started on stress dose hydrocortisone due to hypotension on admission. - Not symptomatic.  Continue to follow vitals.  These are improving. - Starting taper off stress dose steroids.    Hypothyroidism Continue with Synthroid  TSH normal   GERD (gastroesophageal reflux disease) Continue with Tums   CAD (coronary artery disease) C/w aspirin . Hold tenormin  and imdur  due to BP. C.w. crestor .   Essential hypertension -BPs have started to creep back upwards. - titrating off hydrocortisone.  Watch BPs  DVT prophylaxis:  heparin injection 5,000 Units Start: 03/25/24 0600 SCDs Start: 03/24/24 1856  Code Status:   Code Status: Do not attempt resuscitation (DNR) PRE-ARREST INTERVENTIONS DESIRED Family Communication: Daughter at bedside Level of care: Med-Surg Status is: Inpatient Dispo: Pending resolution of AKI and overall improvement.  PT/OT consulted today.   Consults called: Nephrology  Subjective: Better spirits today than she has been.  No complaints.  Objective: Vitals:   03/27/24 2134 03/28/24 0500 03/28/24 0558 03/28/24 1244  BP: (!) 154/67  139/60 (!) 150/69  Pulse: (!) 57  63 (!) 57  Resp: 17  17 18   Temp: 98.1 F (36.7 C)  98 F (36.7 C) 98.4 F (36.9 C)  TempSrc:   Oral Oral  SpO2: 100%  97% 97%  Weight:  91.6 kg    Height:        Intake/Output Summary (Last 24 hours) at  03/28/2024 1554 Last data filed at 03/28/2024 1416 Gross per 24 hour  Intake 603 ml  Output 1450 ml  Net -847 ml   Filed Weights   03/25/24 1508 03/27/24 0500 03/28/24 0500  Weight: 86.3 kg 92.4 kg 91.6 kg   Body mass index is 36.94 kg/m.  Gen: 83 y.o. female in no apparent distress.  Nontoxic.  Pleasant and cheerful today.   Somewhat hard of hearing. Pulm: Non-labored breathing.  Does have some fairly notable wheezing on exam today. CV: Regular rate and rhythm. No murmur, rub, or gallop. No JVD GI: Abdomen soft, non-tender, non-distended, obese Ext: Warm, with chronic skin changes bilaterally. Skin: Does have some mild erythema bilateral pretibial shins Neuro: Alert and oriented. No focal neurological deficits. Psych: Calm  Judgement and insight appear normal. Mood & affect appropriate.     I have personally reviewed the following labs and images: CBC: Recent Labs  Lab 03/24/24 1404 03/25/24 0316 03/25/24 1246 03/26/24 0355 03/27/24 0332 03/28/24 0329  WBC 19.7* 16.3* 13.8* 11.9* 12.8* 14.0*  NEUTROABS 16.9*  --   --   --   --   --   HGB 9.6* 8.2* 8.3* 7.4* 7.7* 8.1*  HCT 28.8* 25.4* 26.1* 23.5* 24.1* 25.1*  MCV 86.2 90.1 89.7 89.7 89.3 89.3  PLT 345 291 301 287 316 283   BMP &GFR Recent Labs  Lab 03/25/24 0316 03/25/24 1246 03/26/24 0355 03/27/24 0332 03/28/24 0329  NA 133* 137 141 140 143  K 4.2 4.0 4.4 3.8 3.7  CL 108 111 117* 113* 111  CO2 13* 12* 13* 15* 21*  GLUCOSE 126* 140* 137* 135* 137*  BUN 163* 140* 122* 100* 83*  CREATININE 3.27* 2.50* 2.30* 1.66* 1.67*  CALCIUM  7.5* 7.8* 7.6* 8.0* 7.8*  PHOS  --   --  5.8* 4.3 3.3   Estimated Creatinine Clearance: 25.9 mL/min (A) (by C-G formula based on SCr of 1.67 mg/dL (H)). Liver & Pancreas: Recent Labs  Lab 03/24/24 1404 03/26/24 0355 03/27/24 0332 03/28/24 0329  AST 30  --   --   --   ALT 20  --   --   --   ALKPHOS 66  --   --   --   BILITOT 0.9  --   --   --   PROT 7.6  --   --   --   ALBUMIN 2.7* 2.0* 2.2* 2.3*   No results for input(s): "LIPASE", "AMYLASE" in the last 168 hours. No results for input(s): "AMMONIA" in the last 168 hours. Diabetic: No results for input(s): "HGBA1C" in the last 72 hours. No results for input(s): "GLUCAP" in the last 168 hours. Cardiac Enzymes: Recent Labs  Lab 03/25/24 0039  03/25/24 1246  CKTOTAL 422* 268*   No results for input(s): "PROBNP" in the last 8760 hours. Coagulation Profile: Recent Labs  Lab 03/25/24 0316  INR 1.5*   Thyroid Function Tests: Recent Labs    03/26/24 0355  TSH 1.748   Lipid Profile: No results for input(s): "CHOL", "HDL", "LDLCALC", "TRIG", "CHOLHDL", "LDLDIRECT" in the last 72 hours. Anemia Panel: No results for input(s): "VITAMINB12", "FOLATE", "FERRITIN", "TIBC", "IRON", "RETICCTPCT" in the last 72 hours.  Urine analysis:    Component Value Date/Time   COLORURINE YELLOW 03/24/2024 1611   APPEARANCEUR CLOUDY (A) 03/24/2024 1611   LABSPEC 1.012 03/24/2024 1611   PHURINE 5.0 03/24/2024 1611   GLUCOSEU NEGATIVE 03/24/2024 1611   HGBUR MODERATE (A) 03/24/2024 1611   BILIRUBINUR NEGATIVE 03/24/2024 1611  KETONESUR NEGATIVE 03/24/2024 1611   PROTEINUR NEGATIVE 03/24/2024 1611   NITRITE NEGATIVE 03/24/2024 1611   LEUKOCYTESUR LARGE (A) 03/24/2024 1611   Sepsis Labs: Invalid input(s): "PROCALCITONIN", "LACTICIDVEN"  Microbiology: Recent Results (from the past 240 hours)  Resp panel by RT-PCR (RSV, Flu A&B, Covid) Anterior Nasal Swab     Status: None   Collection Time: 03/24/24  2:04 PM   Specimen: Anterior Nasal Swab  Result Value Ref Range Status   SARS Coronavirus 2 by RT PCR NEGATIVE NEGATIVE Final    Comment: (NOTE) SARS-CoV-2 target nucleic acids are NOT DETECTED.  The SARS-CoV-2 RNA is generally detectable in upper respiratory specimens during the acute phase of infection. The lowest concentration of SARS-CoV-2 viral copies this assay can detect is 138 copies/mL. A negative result does not preclude SARS-Cov-2 infection and should not be used as the sole basis for treatment or other patient management decisions. A negative result may occur with  improper specimen collection/handling, submission of specimen other than nasopharyngeal swab, presence of viral mutation(s) within the areas targeted by this  assay, and inadequate number of viral copies(<138 copies/mL). A negative result must be combined with clinical observations, patient history, and epidemiological information. The expected result is Negative.  Fact Sheet for Patients:  BloggerCourse.com  Fact Sheet for Healthcare Providers:  SeriousBroker.it  This test is no t yet approved or cleared by the [place]  FDA and  has been authorized for detection and/or diagnosis of SARS-CoV-2 by FDA under an Emergency Use Authorization (EUA). This EUA will remain  in effect (meaning this test can be used) for the duration of the COVID-19 declaration under Section 564(b)(1) of the Act, 21 U.S.C.section 360bbb-3(b)(1), unless the authorization is terminated  or revoked sooner.       Influenza A by PCR NEGATIVE NEGATIVE Final   Influenza B by PCR NEGATIVE NEGATIVE Final    Comment: (NOTE) The Xpert Xpress SARS-CoV-2/FLU/RSV plus assay is intended as an aid in the diagnosis of influenza from Nasopharyngeal swab specimens and should not be used as a sole basis for treatment. Nasal washings and aspirates are unacceptable for Xpert Xpress SARS-CoV-2/FLU/RSV testing.  Fact Sheet for Patients: BloggerCourse.com  Fact Sheet for Healthcare Providers: SeriousBroker.it  This test is not yet approved or cleared by the [place]  FDA and has been authorized for detection and/or diagnosis of SARS-CoV-2 by FDA under an Emergency Use Authorization (EUA). This EUA will remain in effect (meaning this test can be used) for the duration of the COVID-19 declaration under Section 564(b)(1) of the Act, 21 U.S.C. section 360bbb-3(b)(1), unless the authorization is terminated or revoked.     Resp Syncytial Virus by PCR NEGATIVE NEGATIVE Final    Comment: (NOTE) Fact Sheet for Patients: BloggerCourse.com  Fact Sheet for  Healthcare Providers: SeriousBroker.it  This test is not yet approved or cleared by the [place]  FDA and has been authorized for detection and/or diagnosis of SARS-CoV-2 by FDA under an Emergency Use Authorization (EUA). This EUA will remain in effect (meaning this test can be used) for the duration of the COVID-19 declaration under Section 564(b)(1) of the Act, 21 U.S.C. section 360bbb-3(b)(1), unless the authorization is terminated or revoked.  Performed at [facility], [address], [place], [place] [address]   Urine Culture (for pregnant, neutropenic or urologic patients or patients with an indwelling urinary catheter)     Status: Abnormal   Collection Time: 03/24/24  4:11 PM   Specimen: In/Out Cath Urine  Result Value  Ref Range Status   Specimen Description   Final    IN/OUT CATH URINE Performed at [facility], [address], [place], [place] [address]    Special Requests   Final    NONE Performed at [facility], [address], [place], [place] [address]    Culture >=100,000 COLONIES/mL ESCHERICHIA COLI (A)  Final   Report Status 03/26/2024 FINAL  Final   Organism ID, Bacteria ESCHERICHIA COLI (A)  Final      Susceptibility   Escherichia coli - MIC*    AMPICILLIN 16 INTERMEDIATE Intermediate     CEFAZOLIN  <=4 SENSITIVE Sensitive     CEFEPIME <=0.12 SENSITIVE Sensitive     CEFTRIAXONE  <=0.25 SENSITIVE Sensitive     CIPROFLOXACIN <=0.25 SENSITIVE Sensitive     GENTAMICIN <=1 SENSITIVE Sensitive     IMIPENEM <=0.25 SENSITIVE Sensitive     NITROFURANTOIN <=16 SENSITIVE Sensitive     TRIMETH/SULFA <=20 SENSITIVE Sensitive     AMPICILLIN/SULBACTAM 4 SENSITIVE Sensitive     PIP/TAZO <=4 SENSITIVE Sensitive ug/mL    * >=100,000 COLONIES/mL ESCHERICHIA COLI  Culture, blood (Routine X 2) w Reflex to ID Panel     Status: None (Preliminary result)   Collection Time: 03/25/24 12:39 AM    Specimen: BLOOD  Result Value Ref Range Status   Specimen Description   Final    BLOOD BLOOD LEFT ARM Performed at [facility], [address] [place], [place] [address]    Special Requests   Final    BOTTLES DRAWN AEROBIC AND ANAEROBIC Blood Culture results may not be optimal due to an inadequate volume of blood received in culture bottles Performed at [facility], [address] [place], [place] [address]    Culture   Final    NO GROWTH 3 DAYS Performed at [facility], [address] [place], [place] [address]    Report Status PENDING  Incomplete  Culture, blood (Routine X 2) w Reflex to ID Panel     Status: None (Preliminary result)   Collection Time: 03/25/24 12:39 AM   Specimen: BLOOD  Result Value Ref Range Status   Specimen Description   Final    BLOOD BLOOD LEFT HAND Performed at [facility], [address] [place], [place] [address]    Special Requests   Final    BOTTLES DRAWN AEROBIC AND ANAEROBIC Blood Culture results may not be optimal due to an inadequate volume of blood received in culture bottles Performed at [facility], [address], [place], [place] [address]    Culture   Final    NO GROWTH 3 DAYS Performed at [facility], [address] [place], [place] [address]    Report Status PENDING  Incomplete    Radiology Studies: No results found.   Scheduled Meds:  aspirin  EC  81 mg Oral Daily   cefadroxil  500 mg Oral q1800   heparin  5,000 Units Subcutaneous Q8H   hydrocortisone  15 mg Oral TID   hydrocortisone cream   Topical BID   levothyroxine   88 mcg Oral QAC breakfast   midodrine  5 mg Oral TID WC   rosuvastatin   10 mg Oral Once per day on Tuesday Friday   sertraline  25 mg Oral Daily   sodium bicarbonate  650 mg Oral BID   sodium chloride  flush  3 mL Intravenous Q12H   Continuous Infusions:  lactated ringers   LOS: 4 days   35 minutes  with more than 50% spent in reviewing records, counseling patient/family and coordinating care.  Trenton Frock, MD [facility] [email or web address] 03/28/2024, 3:54 PM

## 2024-03-28 NOTE — Progress Notes (Signed)
 Physical Therapy Treatment Patient Details Name: Jody Shaw MRN: [id number] DOB: 10-May-1941 Today's Date: 03/28/2024   History of Present Illness 83 yo female presents to therapy following hospital admission on 03/24/2024 due to mechanical fall in which pt struck head and reportedly on floor for 2-3hrs. Pt indicates progressive weakness, poor PO intake, SOB and cough. Pt has chronic LE lymphedema. Pt was found to have AKI with elevated CK and troponin levels. Head [facility]. 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    PT Comments  PT - Cognition Comments: AxO x 3 pleasant Lady who lives home alone and sleeps in a recliner. Assisted OOB was difficult.  General bed mobility comments: increased time and HOB elevated with use of rails, Pt was able to move her own legs to side of bed but did need Min Assist for upper body as well as scooting to EOB.  Pt admits to sleeping in a recliner chair "easier". General transfer comment: increased time and assist to rise from bed at [facility] Assist but required increased asisst from lower toilet height.  Unsteady with turns and caution with back steps. General Gait Details: Pt required + 2 MinMod Asisst with limited amb distance 18 feet due to weakness and increased dyspnea.  Unsteady esp with turns and back steps to chair.  HIGH FALL RISK. Pt will need ST Rehab at SNF to address mobility and functional decline prior to safely returning home.    If plan is discharge home, recommend the following: Two people to help with walking and/or transfers;Two people to help with bathing/dressing/bathroom;Assistance with cooking/housework;Assist for transportation;Help with stairs or ramp for entrance   Can travel by private vehicle     No  Equipment Recommendations  None recommended by PT    Recommendations for Other Services       Precautions / Restrictions Precautions Precautions:  Fall Restrictions Weight Bearing Restrictions Per Provider Order: No     Mobility  Bed Mobility Overal bed mobility: Needs Assistance Bed Mobility: Supine to Sit     Supine to sit: Min assist     General bed mobility comments: increased time and HOB elevated with use of rails, Pt was able to move her own legs to side of bed but did need Min Assist for upper body as well as scooting to EOB.  Pt admits to sleeping in a recliner chair "easier".    Transfers Overall transfer level: Needs assistance Equipment used: Rolling walker (2 wheels) Transfers: Sit to/from Stand, Bed to chair/wheelchair/BSC Sit to Stand: Min assist, Contact guard assist           General transfer comment: increased time and assist to rise from bed at Contact Guard Assist but required increased asisst from lower toilet height.  Unsteady with turns and caution with back steps.    Ambulation/Gait Ambulation/Gait assistance: Min assist, Mod assist, +2 safety/equipment Gait Distance (Feet): 18 Feet Assistive device: Rolling walker (2 wheels) Gait Pattern/deviations: Step-to pattern, Decreased step length - left, Decreased step length - right, Trunk flexed, Shuffle Gait velocity: decreased     General Gait Details: Pt required + 2 MinMod Asisst with limited amb distance 18 feet due to weakness and increased dyspnea.  Unsteady esp with turns and back steps to chair.  HIGH FALL RISK.   Stairs             Wheelchair Mobility     Tilt Bed  Modified Rankin (Stroke Patients Only)       Balance                                            Communication Communication Factors Affecting Communication: Hearing impaired  Cognition Arousal: Alert Behavior During Therapy: WFL for tasks assessed/performed                           PT - Cognition Comments: AxO x 3 pleasant Lady who lives home alone and sleeps in a recliner. Following commands: Intact      Cueing  Cueing Techniques: Verbal cues  Exercises      General Comments        Pertinent Vitals/Pain Pain Assessment Pain Assessment: Faces Faces Pain Scale: Hurts a little bit Pain Location: B LE (chronic) Lymphodema wrapped Pain Descriptors / Indicators: Aching, Constant, Grimacing, Guarding Pain Intervention(s): Monitored during session, Repositioned    Home Living                          Prior Function            PT Goals (current goals can now be found in the care plan section) Progress towards PT goals: Progressing toward goals    Frequency    Min 2X/week      PT Plan      Co-evaluation              AM-PAC PT "6 Clicks" Mobility   Outcome Measure  Help needed turning from your back to your side while in a flat bed without using bedrails?: A Lot Help needed moving from lying on your back to sitting on the side of a flat bed without using bedrails?: A Lot Help needed moving to and from a bed to a chair (including a wheelchair)?: A Lot Help needed standing up from a chair using your arms (e.g., wheelchair or bedside chair)?: A Lot Help needed to walk in hospital room?: A Lot Help needed climbing 3-5 steps with a railing? : Total 6 Click Score: 11    End of Session Equipment Utilized During Treatment: Gait belt Activity Tolerance: Patient limited by fatigue Patient left: in chair;with call bell/phone within reach;with chair alarm set Nurse Communication: Mobility status PT Visit Diagnosis: Unsteadiness on feet (R26.81);Other abnormalities of gait and mobility (R26.89);Muscle weakness (generalized) (M62.81);History of falling (Z91.81);Difficulty in walking, not elsewhere classified (R26.2);Pain Pain - Right/Left: Right Pain - part of body: Leg;Ankle and joints of foot     Time: 1130-1155 PT Time Calculation (min) (ACUTE ONLY): 25 min  Charges:    $Gait Training: 8-22 mins $Therapeutic Activity: 8-22 mins PT General Charges $$ ACUTE PT  VISIT: 1 Visit                     Bess Broody  PTA Acute  Rehabilitation Services Office M-F          [phone]

## 2024-03-28 NOTE — TOC Progression Note (Addendum)
 Transition of Care [facility]) - Progression Note    Patient Details  Name: Jody Shaw MRN: [id number] Date of Birth: 09-26-1941  Transition of [facility] CM/SW Contact  Bari Leys, RN Phone Number: 03/28/2024, 11:09 AM  Clinical Narrative:  Level 2 PASRR [id number]   -2:41pm Spoke with patient's dtr at 11:30am during her lunch break, she reports she could not open the email NCM sent her yesterday with bed offers . NCM text short term rehab bed offers to patient's daughter for review. NCM sent text message to pt's dtr at 1:40pm requesting facility choice asap to initiate insurance auth, no response from pt's dtr at this time. Will continue outreach to pt's dtr for facility decision.     Expected Discharge Plan: Skilled Nursing Facility Barriers to Discharge: Continued Medical Work up  Expected Discharge Plan and Services       Living arrangements for the past 2 months: Single Family Home                                       Social Determinants of Health (SDOH) Interventions SDOH Screenings   Food Insecurity: No Food Insecurity (03/24/2024)  Housing: Low Risk  (03/24/2024)  Transportation Needs: No Transportation Needs (03/24/2024)  Utilities: Not At Risk (03/24/2024)  Financial Resource Strain: Low Risk  (10/16/2023)   Received from [facility]  Tobacco Use: Medium Risk (03/25/2024)    Readmission Risk Interventions    03/25/2024    2:11 PM  Readmission Risk Prevention Plan  Transportation Screening Complete  PCP or Specialist Appt within 5-7 Days Complete  Home Care Screening Complete  Medication Review (RN CM) Complete

## 2024-03-28 NOTE — Plan of Care (Signed)
   Problem: Activity: Goal: Risk for activity intolerance will decrease Outcome: Progressing   Problem: Nutrition: Goal: Adequate nutrition will be maintained Outcome: Progressing   Problem: Pain Managment: Goal: General experience of comfort will improve and/or be controlled Outcome: Progressing   Problem: Safety: Goal: Ability to remain free from injury will improve Outcome: Progressing

## 2024-03-29 DIAGNOSIS — N179 Acute kidney failure, unspecified: Secondary | ICD-10-CM | POA: Diagnosis not present

## 2024-03-29 MED ORDER — CEFADROXIL 500 MG PO CAPS: 500.0000 mg | ORAL_CAPSULE | Freq: Every day | ORAL | 0 refills | Status: DC

## 2024-03-29 MED ORDER — PREDNISONE 20 MG PO TABS: ORAL_TABLET | ORAL | 0 refills | Status: AC

## 2024-03-29 MED ORDER — HYDROCORTISONE 10 MG PO TABS
10.0000 mg | ORAL_TABLET | Freq: Two times a day (BID) | ORAL | Status: DC
  Administered 2024-03-29: 10 mg via ORAL
  Filled 2024-03-29 (×2): qty 1

## 2024-03-29 MED ORDER — CEFADROXIL 500 MG PO CAPS: 500.0000 mg | ORAL_CAPSULE | Freq: Every day | ORAL | 0 refills | Status: AC

## 2024-03-29 NOTE — Discharge Summary (Signed)
 Physician Discharge Summary   Patient: Jody Shaw MRN: [id number] DOB: 1941-01-24  Admit date:     03/24/2024  Discharge date: 03/29/24  Discharge Physician: Trenton Frock   PCP: Rosslyn Coons, MD   Recommendations at discharge:   Patient received stress dose steroids while in house.  She was discharged home on a 10-day course of prednisone .  This was 20 mg for 5 days followed by 10 mg for 5 days followed by resumption of her daily home 5 mg dose which she has been on for giant cell arteritis. She should follow-up with her PCP in the next 1 to 2 weeks to have her creatinine rechecked and ensure it remains within what seems to be her normal. She was recommended for SNF at discharge but both patient and her daughter preferred to take her back home.  She has been set up with home health physical therapy/OT/home health aide. We have held the patient's torsemide  at discharge.  She should not take this for lymphedema but should only take this for true documented weight gain as it likely contributed to her volume depletion. She was recommended to follow-up with wound care outpatient for her chronic lymphedema. She was discharged home with 3 more days of cefadroxil  for UTI diagnosed while in house.  Discharge Diagnoses: Principal Problem:   AKI (acute kidney injury) (HCC) Active Problems:   Leukocytosis   Lymphedema   Anxiety  Resolved Problems:   * No resolved hospital problems. *  Hospital Course: 83 y.o. female with known CKD stage III, chronic bilateral lymphedema, giant cell arteritis, hypothyroidism who presents with worsening lymphedema and leg pain as well as increasing weakness and decreasing appetite over the past week.  Did have a fall at home attributed to leaking of her lymphedema causing her some ox to become wet and sleeping.  Was on the ground for about 3 hours if not a bit longer.  Brought to the ER for the same.  Found to have profound AKI deemed secondary to her  severe volume depletion plus ARB usage plus hypotension.  Initial creatinine was greater than 4 and trended down to 1.6 for several days prior to discharge after rehydration.  She also became adrenally insufficient and we put her on hydrocortisone  stress dose steroids.  She was discharged home on prednisone  continue taper as above.  Recommendation to discharge by nephrology was no further ACE inhibitor's nor ARB's in the future.  Also hold torsemide  except for documented weight gain.   Assessment & Plan: * AKI (acute kidney injury) (HCC) -AKI secondary to severe volume depletion plus ARB plus hypotension. - Will plan input and discharge summary to avoid ARB/ACE inhibitors in the future given severity of her AKI. - Creatinine downtrending nicely, BUN 83 today. -Hold torsemide  except for documented weight gain - likely close to her home creatinine, unclear what it was prior to admission   UTI: - E coli grown, resistant only to ampicillin - started on duracef, transitioned from CTX - Discharged home on 3 more days to end on 03/29/2024.   Anxiety Chronic, continue with sertraline  Actually much improved today.  Continues to improve from overall mood standpoint   Lymphedema At this time patient is felt to be dehydrated.  -Chronic.  Greatly appreciate wound input. - Recommended for outpatient wound care clinic   Metabolic acidosis, non-anion gap: - noted, infection improving - Likely from chronic kidney disease  -On oral bicarb currently. - resolving   Leukocytosis -Question of lower extremity cellulitis  versus UTI.  Plan to continue antibiotics for now and trend white blood cells. -Most likely etiology for persistent elevation is ongoing steroid administration.   Giant cell arteritis (HCC) -Chronic prednisone . - Discharged home on prednisone  taper as above.   Hypothyroidism Continue with Synthroid  TSH normal   GERD (gastroesophageal reflux disease) Continue with Tums   CAD  (coronary artery disease) C/w aspirin . Hold tenormin  and imdur  due to BP. C.w. crestor .   Essential hypertension -BPs have started to creep back upwards. - titrating off hydrocortisone .  Watch BPs       Consultants: Nephrology Procedures performed: None Disposition: Home Diet recommendation:  Discharge Diet Orders (From admission, onward)     Start     Ordered   03/29/24 0000  Diet - low sodium heart healthy        03/29/24 1238           Regular diet DISCHARGE MEDICATION: Allergies as of 03/29/2024       Reactions   Ace Inhibitors Other (See Comments)   Severe AKI from dehydration and ARB   Angiotensin Receptor Blockers Other (See Comments)   Severe AKI from dehydration and ARB   Iodine    Septra [sulfamethoxazole-trimethoprim]    Sulfa Antibiotics    Watermelon [citrullus Vulgaris]         Medication List     STOP taking these medications    losartan  50 MG tablet Commonly known as: COZAAR    torsemide  20 MG tablet Commonly known as: DEMADEX        TAKE these medications    aspirin  81 MG tablet Take 81 mg by mouth as needed.   atenolol  25 MG tablet Commonly known as: TENORMIN  TAKE 1 TABLET BY MOUTH EVERY DAY.   calcium  carbonate 500 MG chewable tablet Commonly known as: TUMS - dosed in mg elemental calcium  Chew 1 tablet by mouth as needed for indigestion or heartburn. Per patient taking every 2 days   cefadroxil  500 MG capsule Commonly known as: DURICEF Take 1 capsule (500 mg total) by mouth daily at 6 PM for 3 days.   ferrous sulfate 324 MG Tbec Take 324 mg by mouth daily.   isosorbide  mononitrate 30 MG 24 hr tablet Commonly known as: IMDUR  TAKE 1 TABLET BY MOUTH EVERY EVENING   levothyroxine  88 MCG tablet Commonly known as: SYNTHROID  Take 88 mcg by mouth daily before breakfast.   Multi Vitamin Daily Tabs Take 2 tablets by mouth daily.   nitroGLYCERIN  0.4 MG SL tablet Commonly known as: NITROSTAT  Place 1 tablet (0.4 mg total)  under the tongue every 5 (five) minutes as needed for chest pain.   potassium chloride  SA 20 MEQ tablet Commonly known as: KLOR-CON  M Take 1 tablet (20 mEq total) by mouth daily.   predniSONE  20 MG tablet Commonly known as: DELTASONE  Take 1 tablet (20 mg total) by mouth daily with breakfast for 5 days, THEN 0.5 tablets (10 mg total) daily with breakfast for 5 days. Start taking on: Mar 29, 2024 What changed:  medication strength See the new instructions.   rosuvastatin  10 MG tablet Commonly known as: CRESTOR  Take 1 tablet by mouth 2 times a week.   sertraline  25 MG tablet Commonly known as: ZOLOFT  Take 25 mg by mouth daily.   sodium chloride  5 % ophthalmic solution Commonly known as: MURO 128 Place 1 drop into the right eye every 4 (four) hours as needed for eye irritation.   triamcinolone cream 0.1 % Commonly known as: KENALOG Apply  topically as needed.               Durable Medical Equipment  (From admission, onward)           Start     Ordered   03/29/24 1235  DME Walker  Once       Question Answer Comment  Walker: Without Wheels   Patient needs a walker to treat with the following condition Lymphedema      03/29/24 1238   03/29/24 1235  DME 3-in-1  Once        03/29/24 1238              Discharge Care Instructions  (From admission, onward)           Start     Ordered   03/29/24 0000  Discharge wound care:       Comments: Keep legs wrapped and bandaged   03/29/24 1238            Follow-up Information     [facility], [facility] Follow up.   Specialty: Home Health Services Why: Gasper Karst will provide PT, OT, and aide in the home after discharge. Contact information: [address] [place] [address] [phone]                Discharge Exam: Filed Weights   03/25/24 1508 03/27/24 0500 03/28/24 0500  Weight: 86.3 kg 92.4 kg 91.6 kg   Gen: 83 y.o. female in no apparent distress.  Nontoxic.  Pleasant and  cheerful today.  Somewhat hard of hearing. Pulm: Non-labored breathing.  Does have some fairly notable wheezing on exam today. CV: Regular rate and rhythm. No murmur, rub, or gallop. No JVD GI: Abdomen soft, non-tender, non-distended, obese Ext: Warm, with chronic skin changes bilaterally. Skin: Does have some mild erythema bilateral pretibial shins Neuro: Alert and oriented. No focal neurological deficits. Psych: Calm  Judgement and insight appear normal. Mood & affect appropriate.    Condition at discharge: Good  The results of significant diagnostics from this hospitalization (including imaging, microbiology, ancillary and laboratory) are listed below for reference.   Imaging Studies: VAS US  ABI WITH/WO TBI Result Date: 03/26/2024  LOWER EXTREMITY DOPPLER STUDY Patient Name:  SHONTAE ROSILES  Date of Exam:   03/25/2024 Medical Rec #: [id number]         Accession #:    [id number] Date of Birth: June 09, 1941         Patient Gender: F Patient Age:   83 years Exam Location:  [facility] Procedure:      VAS US  ABI WITH/WO TBI Referring Phys: --------------------------------------------------------------------------------  Indications: Rest pain. High Risk Factors: Hypertension, hyperlipidemia, past history of smoking,                    coronary artery disease. Other Factors: Lymphedema.  Limitations: Today's exam was limited due to an open wound and involuntary              patient movement (due to pain). Comparison Study: No previous exams Performing Technologist: Hill, Jody RVT, RDMS  Examination Guidelines: A complete evaluation includes at minimum, Doppler waveform signals and systolic blood pressure reading at the level of bilateral brachial, anterior tibial, and posterior tibial arteries, when vessel segments are accessible. Bilateral testing is considered an integral part of a complete examination. Photoelectric Plethysmograph (PPG) waveforms and toe systolic pressure readings are included  as required and additional duplex testing as needed. Limited  examinations for reoccurring indications may be performed as noted.  ABI Findings: +---------+------------------+-----+----------+----------+ Right    Rt Pressure (mmHg)IndexWaveform  Comment    +---------+------------------+-----+----------+----------+ Brachial 98                     triphasic            +---------+------------------+-----+----------+----------+ PTA                                       open wound +---------+------------------+-----+----------+----------+ DP       233               2.38 monophasic           +---------+------------------+-----+----------+----------+ Great Toe0                 0.00 Absent               +---------+------------------+-----+----------+----------+ +---------+------------------+-----+----------+-------+ Left     Lt Pressure (mmHg)IndexWaveform  Comment +---------+------------------+-----+----------+-------+ Brachial 95                     biphasic          +---------+------------------+-----+----------+-------+ PTA      109               1.11 monophasic        +---------+------------------+-----+----------+-------+ DP       254               2.59 monophasic        +---------+------------------+-----+----------+-------+ Great Toe0                 0.00 Absent            +---------+------------------+-----+----------+-------+  Arterial wall calcification precludes accurate ankle pressures and ABIs.  Summary: Right: Resting right ankle-brachial index indicates noncompressible right lower extremity arteries. The right toe-brachial index is absent. Arterial Doppler waveforms at the ankle suggest some component of arterial occlusive disease. Left: Resting left ankle-brachial index indicates noncompressible left lower extremity arteries. The left toe-brachial index is absent. Arterial Doppler waveforms at the ankle suggest some component of arterial occlusive  disease. *See table(s) above for measurements and observations.  Electronically signed by Jimmye Moulds MD on 03/26/2024 at 10:22:55 AM.    Final    US  RENAL Result Date: 03/24/2024 CLINICAL DATA:  [id number] AKI (acute kidney injury) (HCC) [id number] EXAM: RENAL / URINARY TRACT ULTRASOUND COMPLETE COMPARISON:  None Available. FINDINGS: Right Kidney: Renal measurements: 9.7 x 5.4 x 6.9 cm = volume: 190 mL. Mild cortical thinning with otherwise normal echogenicity. No mass. No hydronephrosis or nephrolithiasis. Left Kidney: Renal measurements: 9.9 x 5.3 x 5 cm = volume: 137 mL. Mild cortical thinning with otherwise normal echogenicity. No mass. No hydronephrosis or nephrolithiasis. Bladder: Appears normal for degree of bladder distention. Other: None. IMPRESSION: No hydronephrosis or nephrolithiasis. Renal parenchymal changes suggestive of chronic medical renal disease. Electronically Signed   By: Rance Burrows M.D.   On: 03/24/2024 19:44   CT Head Wo Contrast Result Date: 03/24/2024 CLINICAL DATA:  Head trauma, minor (Age >= 65y) Trip and fall yesterday. EXAM: CT HEAD WITHOUT CONTRAST TECHNIQUE: Contiguous axial images were obtained from the base of the skull through the vertex without intravenous contrast. RADIATION DOSE REDUCTION: This exam was performed according to the departmental dose-optimization program which includes automated exposure control, adjustment of the mA and/or kV  according to patient size and/or use of iterative reconstruction technique. COMPARISON:  Head CT 07/31/2017 FINDINGS: Brain: No intracranial hemorrhage, mass effect, or midline shift. Brain volume is normal for age. No hydrocephalus. The basilar cisterns are patent. No evidence of territorial infarct or acute ischemia. No extra-axial or intracranial fluid collection. Vascular: Atherosclerosis of skullbase vasculature without hyperdense vessel or abnormal calcification. Skull: No fracture or focal lesion.  Chronic bifrontal  hyperostosis. Sinuses/Orbits: No acute finding. Partial chronic opacification of lower right mastoid air cells. Bilateral cataract resection. Other: None. IMPRESSION: No acute intracranial abnormality. No skull fracture. Electronically Signed   By: Chadwick Colonel M.D.   On: 03/24/2024 16:24   CT Cervical Spine Wo Contrast Result Date: 03/24/2024 CLINICAL DATA:  Neck trauma. Patient slipped and fell yesterday. Down for approximately 3 hours. Generalized soreness. EXAM: CT CERVICAL SPINE WITHOUT CONTRAST TECHNIQUE: Multidetector CT imaging of the cervical spine was performed without intravenous contrast. Multiplanar CT image reconstructions were also generated. RADIATION DOSE REDUCTION: This exam was performed according to the departmental dose-optimization program which includes automated exposure control, adjustment of the mA and/or kV according to patient size and/or use of iterative reconstruction technique. COMPARISON:  None Available. FINDINGS: Alignment: Straightening without focal angulation or listhesis. Skull base and vertebrae: No evidence of acute cervical spine fracture or traumatic subluxation. The bones appear mildly demineralized. There is mild multilevel spondylosis. Soft tissues and spinal canal: No prevertebral fluid or swelling. No visible canal hematoma. Disc levels: Mild multilevel spondylosis with disc space narrowing, uncinate spurring and facet hypertrophy. Focal posterior osteophyte versus ossification of the posterior longitudinal ligament posterior to the C2 vertebral body. No evidence of large disc herniation. Mild-to-moderate osseous foraminal narrowing at multiple levels. Upper chest: No acute findings. Linear scarring at the left lung apex. Other: No acute soft tissue findings identified in the neck. Bilateral carotid atherosclerosis. IMPRESSION: 1. No evidence of acute cervical spine fracture, traumatic subluxation or static signs of instability. 2. Mild multilevel cervical  spondylosis. Electronically Signed   By: Elmon Hagedorn M.D.   On: 03/24/2024 16:22   DG Tibia/Fibula Right Result Date: 03/24/2024 CLINICAL DATA:  Bilateral lower leg lymphedema with right leg pain. EXAM: RIGHT TIBIA AND FIBULA - 2 VIEW COMPARISON:  None Available. FINDINGS: No acute fracture. The knee and ankle joints appear anatomically aligned. Mild degenerative changes of the knee. Tibiotalar joint space narrowing. Diffuse nonspecific generalized subcutaneous edema of the right lower extremity. No soft tissue gas identified. IMPRESSION: 1. No acute osseous abnormality. 2. Mild degenerative changes of the right knee and ankle. 3. Diffuse nonspecific generalized subcutaneous edema of the right lower extremity. Electronically Signed   By: Mannie Seek M.D.   On: 03/24/2024 14:50   DG Chest 2 View Result Date: 03/24/2024 CLINICAL DATA:  Cough. EXAM: CHEST - 2 VIEW COMPARISON:  X-ray 02/17/2018 FINDINGS: Normal cardiopericardial silhouette. Calcified aorta. No pneumothorax, effusion or edema. No consolidation. There is some linear opacity at the bases likely scar or atelectasis. Degenerative changes along the spine. Films are under penetrated. IMPRESSION: Under penetrated radiographs. Basilar scar or atelectasis. Calcified aorta. Electronically Signed   By: Adrianna Horde M.D.   On: 03/24/2024 14:26    Microbiology: Results for orders placed or performed during the hospital encounter of 03/24/24  Resp panel by RT-PCR (RSV, Flu A&B, Covid) Anterior Nasal Swab     Status: None   Collection Time: 03/24/24  2:04 PM   Specimen: Anterior Nasal Swab  Result Value Ref Range Status  SARS Coronavirus 2 by RT PCR NEGATIVE NEGATIVE Final    Comment: (NOTE) SARS-CoV-2 target nucleic acids are NOT DETECTED.  The SARS-CoV-2 RNA is generally detectable in upper respiratory specimens during the acute phase of infection. The lowest concentration of SARS-CoV-2 viral copies this assay can detect is 138  copies/mL. A negative result does not preclude SARS-Cov-2 infection and should not be used as the sole basis for treatment or other patient management decisions. A negative result may occur with  improper specimen collection/handling, submission of specimen other than nasopharyngeal swab, presence of viral mutation(s) within the areas targeted by this assay, and inadequate number of viral copies(<138 copies/mL). A negative result must be combined with clinical observations, patient history, and epidemiological information. The expected result is Negative.  Fact Sheet for Patients:  BloggerCourse.com  Fact Sheet for Healthcare Providers:  SeriousBroker.it  This test is no t yet approved or cleared by the [place]  FDA and  has been authorized for detection and/or diagnosis of SARS-CoV-2 by FDA under an Emergency Use Authorization (EUA). This EUA will remain  in effect (meaning this test can be used) for the duration of the COVID-19 declaration under Section 564(b)(1) of the Act, 21 U.S.C.section 360bbb-3(b)(1), unless the authorization is terminated  or revoked sooner.       Influenza A by PCR NEGATIVE NEGATIVE Final   Influenza B by PCR NEGATIVE NEGATIVE Final    Comment: (NOTE) The Xpert Xpress SARS-CoV-2/FLU/RSV plus assay is intended as an aid in the diagnosis of influenza from Nasopharyngeal swab specimens and should not be used as a sole basis for treatment. Nasal washings and aspirates are unacceptable for Xpert Xpress SARS-CoV-2/FLU/RSV testing.  Fact Sheet for Patients: BloggerCourse.com  Fact Sheet for Healthcare Providers: SeriousBroker.it  This test is not yet approved or cleared by the [place]  FDA and has been authorized for detection and/or diagnosis of SARS-CoV-2 by FDA under an Emergency Use Authorization (EUA). This EUA will remain in effect (meaning  this test can be used) for the duration of the COVID-19 declaration under Section 564(b)(1) of the Act, 21 U.S.C. section 360bbb-3(b)(1), unless the authorization is terminated or revoked.     Resp Syncytial Virus by PCR NEGATIVE NEGATIVE Final    Comment: (NOTE) Fact Sheet for Patients: BloggerCourse.com  Fact Sheet for Healthcare Providers: SeriousBroker.it  This test is not yet approved or cleared by the [place]  FDA and has been authorized for detection and/or diagnosis of SARS-CoV-2 by FDA under an Emergency Use Authorization (EUA). This EUA will remain in effect (meaning this test can be used) for the duration of the COVID-19 declaration under Section 564(b)(1) of the Act, 21 U.S.C. section 360bbb-3(b)(1), unless the authorization is terminated or revoked.  Performed at [facility], [address], [place], [place] [address]   Urine Culture (for pregnant, neutropenic or urologic patients or patients with an indwelling urinary catheter)     Status: Abnormal   Collection Time: 03/24/24  4:11 PM   Specimen: In/Out Cath Urine  Result Value Ref Range Status   Specimen Description   Final    IN/OUT CATH URINE Performed at [facility], [address] [place], [place] [address]    Special Requests   Final    NONE Performed at [facility], [address] [place], [place] [address]    Culture >=100,000 COLONIES/mL ESCHERICHIA COLI (A)  Final   Report Status 03/26/2024 FINAL  Final   Organism ID, Bacteria ESCHERICHIA COLI (  A)  Final      Susceptibility   Escherichia coli - MIC*    AMPICILLIN 16 INTERMEDIATE Intermediate     CEFAZOLIN  <=4 SENSITIVE Sensitive     CEFEPIME <=0.12 SENSITIVE Sensitive     CEFTRIAXONE  <=0.25 SENSITIVE Sensitive     CIPROFLOXACIN <=0.25 SENSITIVE Sensitive     GENTAMICIN <=1 SENSITIVE Sensitive     IMIPENEM <=0.25 SENSITIVE Sensitive      NITROFURANTOIN <=16 SENSITIVE Sensitive     TRIMETH/SULFA <=20 SENSITIVE Sensitive     AMPICILLIN/SULBACTAM 4 SENSITIVE Sensitive     PIP/TAZO <=4 SENSITIVE Sensitive ug/mL    * >=100,000 COLONIES/mL ESCHERICHIA COLI  Culture, blood (Routine X 2) w Reflex to ID Panel     Status: None (Preliminary result)   Collection Time: 03/25/24 12:39 AM   Specimen: BLOOD  Result Value Ref Range Status   Specimen Description   Final    BLOOD BLOOD LEFT ARM Performed at [facility], [address], [place], [place] [address]    Special Requests   Final    BOTTLES DRAWN AEROBIC AND ANAEROBIC Blood Culture results may not be optimal due to an inadequate volume of blood received in culture bottles Performed at [facility], [address] [place], [place] [address]    Culture   Final    NO GROWTH 4 DAYS Performed at [facility], [address] [place], [place] [address]    Report Status PENDING  Incomplete  Culture, blood (Routine X 2) w Reflex to ID Panel     Status: None (Preliminary result)   Collection Time: 03/25/24 12:39 AM   Specimen: BLOOD  Result Value Ref Range Status   Specimen Description   Final    BLOOD BLOOD LEFT HAND Performed at [facility], [address] [place], [place] [address]    Special Requests   Final    BOTTLES DRAWN AEROBIC AND ANAEROBIC Blood Culture results may not be optimal due to an inadequate volume of blood received in culture bottles Performed at [facility], [address] [place], [place] [address]    Culture   Final    NO GROWTH 4 DAYS Performed at [facility], [address] [place], [place] [address]    Report Status PENDING  Incomplete    Labs: CBC: Recent Labs  Lab 03/24/24 1404 03/25/24 0316 03/25/24 1246 03/26/24 0355 03/27/24 0332 03/28/24 0329  WBC 19.7* 16.3* 13.8* 11.9* 12.8* 14.0*  NEUTROABS 16.9*  --   --   --   --   --   HGB 9.6*  8.2* 8.3* 7.4* 7.7* 8.1*  HCT 28.8* 25.4* 26.1* 23.5* 24.1* 25.1*  MCV 86.2 90.1 89.7 89.7 89.3 89.3  PLT 345 291 301 287 316 283   Basic Metabolic Panel: Recent Labs  Lab 03/25/24 0316 03/25/24 1246 03/26/24 0355 03/27/24 0332 03/28/24 0329  NA 133* 137 141 140 143  K 4.2 4.0 4.4 3.8 3.7  CL 108 111 117* 113* 111  CO2 13* 12* 13* 15* 21*  GLUCOSE 126* 140* 137* 135* 137*  BUN 163* 140* 122* 100* 83*  CREATININE 3.27* 2.50* 2.30* 1.66* 1.67*  CALCIUM  7.5* 7.8* 7.6* 8.0* 7.8*  PHOS  --   --  5.8* 4.3 3.3   Liver Function Tests: Recent Labs  Lab 03/24/24 1404 03/26/24 0355 03/27/24 0332 03/28/24 0329  AST 30  --   --   --   ALT 20  --   --   --  ALKPHOS 66  --   --   --   BILITOT 0.9  --   --   --   PROT 7.6  --   --   --   ALBUMIN 2.7* 2.0* 2.2* 2.3*   CBG: No results for input(s): "GLUCAP" in the last 168 hours.  Discharge time spent: Less than 30 minutes.  Signed: Trenton Frock, MD [facility] 03/29/2024

## 2024-03-29 NOTE — Progress Notes (Signed)
 This RN confirmed w/ pt's daughter that only prednisone  was sent to [facility], follow up w/ Dr Betsey Brow regarding cefadroxil  prescription via secure chat.  First AVS was printed prior to antibiotic being sent in. Dr Betsey Brow confirmed antibiotic had now been [facility] to [facility] on [address]. Pt's daughter updated via phone call from this RN. No other questions at this time.

## 2024-03-29 NOTE — TOC Transition Note (Signed)
 Transition of Care [facility]) - Discharge Note  Patient Details  Name: Jody Shaw MRN: [id number] Date of Birth: 1940/12/07  Transition of Care [facility]) CM/SW Contact:  Zenon Hilda, LCSW Phone Number: 03/29/2024, 1:19 PM  Clinical Narrative: Daughter, [name], now prefers to take patient home with [facility] services. Daughter does not have an agency preference. Daughter also confirmed patient has a rolling walker and BSC at home, so there are no DME needs at this time. CSW made [facility] (PT/OT/aide) referral to Cindie with [facility], which was accepted. CSW updated daughter. TOC signing off.  Final next level of care: Home w Home Health Services Barriers to Discharge: Barriers Resolved  Patient Goals and CMS Choice Patient states their goals for this hospitalization and ongoing recovery are:: Go home with [facility] CMS Medicare.gov Compare Post Acute Care list provided to:: Patient Represenative (must comment) (Daughter: Mia Adam) Choice offered to / list presented to : Adult Children  Discharge Plan and Services Additional resources added to the After Visit Summary for           DME Arranged: N/A DME Agency: NA HH Arranged: PT, OT, Nurse's Aide HH Agency: [facility] Date [facility] Agency Contacted: 03/29/24 Time HH Agency Contacted: 1252 Representative spoke with at [facility] Agency: Cindie  Social Drivers of Health (SDOH) Interventions SDOH Screenings   Food Insecurity: No Food Insecurity (03/24/2024)  Housing: Low Risk  (03/24/2024)  Transportation Needs: No Transportation Needs (03/24/2024)  Utilities: Not At Risk (03/24/2024)  Financial Resource Strain: Low Risk  (10/16/2023)   Received from [facility]  Tobacco Use: Medium Risk (03/25/2024)   Readmission Risk Interventions    03/25/2024    2:11 PM  Readmission Risk Prevention Plan  Transportation Screening Complete  PCP or Specialist Appt within 5-7 Days Complete  Home Care Screening Complete  Medication Review (RN CM) Complete

## 2024-03-29 NOTE — Progress Notes (Signed)
 AVS reviewed w/ patient who verbalized an understanding. PIV removed as noted- [facility] to [facility] being closed- meds transferred by [facility] drug, Pt dressed for d/c to home. No other questions at this time. Pt home w/ daughter

## 2024-03-29 NOTE — Plan of Care (Signed)

## 2024-03-30 LAB — CULTURE, BLOOD (ROUTINE X 2)
Culture: NO GROWTH
Culture: NO GROWTH

## 2024-04-10 ENCOUNTER — Other Ambulatory Visit (HOSPITAL_COMMUNITY)

## 2024-06-12 ENCOUNTER — Other Ambulatory Visit (HOSPITAL_COMMUNITY): Payer: Self-pay | Admitting: Endocrinology

## 2024-06-12 DIAGNOSIS — I5022 Chronic systolic (congestive) heart failure: Secondary | ICD-10-CM

## 2024-06-27 ENCOUNTER — Telehealth: Payer: Self-pay | Admitting: Cardiology

## 2024-06-27 NOTE — Telephone Encounter (Signed)
 Patient calling to see what blood thinners she should be on. Please advise

## 2024-06-27 NOTE — Telephone Encounter (Signed)
 Pt was calling to ask if she is supposed to be taking Losartan - informed that she is not supposed to be taking that medication. (Her pharmacy just wanted to make sure)   She is currently taking atenolol .   She has no further questions.

## 2024-10-29 ENCOUNTER — Emergency Department (HOSPITAL_COMMUNITY)

## 2024-10-29 ENCOUNTER — Encounter (HOSPITAL_COMMUNITY): Payer: Self-pay | Admitting: Radiology

## 2024-10-29 ENCOUNTER — Inpatient Hospital Stay (HOSPITAL_COMMUNITY): Admission: EM | Admit: 2024-10-29 | Discharge: 2024-11-13 | Disposition: E | Attending: Student | Admitting: Student

## 2024-10-29 DIAGNOSIS — A419 Sepsis, unspecified organism: Secondary | ICD-10-CM

## 2024-10-29 DIAGNOSIS — J189 Pneumonia, unspecified organism: Secondary | ICD-10-CM | POA: Diagnosis present

## 2024-10-29 DIAGNOSIS — K219 Gastro-esophageal reflux disease without esophagitis: Secondary | ICD-10-CM | POA: Diagnosis present

## 2024-10-29 DIAGNOSIS — M316 Other giant cell arteritis: Secondary | ICD-10-CM | POA: Diagnosis present

## 2024-10-29 DIAGNOSIS — L89896 Pressure-induced deep tissue damage of other site: Secondary | ICD-10-CM | POA: Diagnosis present

## 2024-10-29 DIAGNOSIS — Z66 Do not resuscitate: Secondary | ICD-10-CM | POA: Diagnosis present

## 2024-10-29 DIAGNOSIS — D631 Anemia in chronic kidney disease: Secondary | ICD-10-CM | POA: Diagnosis present

## 2024-10-29 DIAGNOSIS — Z1152 Encounter for screening for COVID-19: Secondary | ICD-10-CM | POA: Diagnosis not present

## 2024-10-29 DIAGNOSIS — R680 Hypothermia, not associated with low environmental temperature: Secondary | ICD-10-CM | POA: Diagnosis present

## 2024-10-29 DIAGNOSIS — I2510 Atherosclerotic heart disease of native coronary artery without angina pectoris: Secondary | ICD-10-CM | POA: Diagnosis present

## 2024-10-29 DIAGNOSIS — N1832 Acute kidney failure, unspecified: Secondary | ICD-10-CM

## 2024-10-29 DIAGNOSIS — I890 Lymphedema, not elsewhere classified: Secondary | ICD-10-CM | POA: Diagnosis not present

## 2024-10-29 DIAGNOSIS — E785 Hyperlipidemia, unspecified: Secondary | ICD-10-CM | POA: Diagnosis present

## 2024-10-29 DIAGNOSIS — E8721 Acute metabolic acidosis: Secondary | ICD-10-CM | POA: Diagnosis present

## 2024-10-29 DIAGNOSIS — F419 Anxiety disorder, unspecified: Secondary | ICD-10-CM | POA: Diagnosis not present

## 2024-10-29 DIAGNOSIS — N390 Urinary tract infection, site not specified: Secondary | ICD-10-CM | POA: Diagnosis present

## 2024-10-29 DIAGNOSIS — F32A Depression, unspecified: Secondary | ICD-10-CM | POA: Diagnosis present

## 2024-10-29 DIAGNOSIS — I878 Other specified disorders of veins: Secondary | ICD-10-CM | POA: Diagnosis present

## 2024-10-29 DIAGNOSIS — Z7952 Long term (current) use of systemic steroids: Secondary | ICD-10-CM

## 2024-10-29 DIAGNOSIS — Z7982 Long term (current) use of aspirin: Secondary | ICD-10-CM

## 2024-10-29 DIAGNOSIS — R6521 Severe sepsis with septic shock: Secondary | ICD-10-CM | POA: Diagnosis present

## 2024-10-29 DIAGNOSIS — B9561 Methicillin susceptible Staphylococcus aureus infection as the cause of diseases classified elsewhere: Secondary | ICD-10-CM

## 2024-10-29 DIAGNOSIS — I129 Hypertensive chronic kidney disease with stage 1 through stage 4 chronic kidney disease, or unspecified chronic kidney disease: Secondary | ICD-10-CM | POA: Diagnosis present

## 2024-10-29 DIAGNOSIS — Z79890 Hormone replacement therapy (postmenopausal): Secondary | ICD-10-CM

## 2024-10-29 DIAGNOSIS — Z87891 Personal history of nicotine dependence: Secondary | ICD-10-CM

## 2024-10-29 DIAGNOSIS — Z888 Allergy status to other drugs, medicaments and biological substances status: Secondary | ICD-10-CM

## 2024-10-29 DIAGNOSIS — E039 Hypothyroidism, unspecified: Secondary | ICD-10-CM | POA: Diagnosis present

## 2024-10-29 DIAGNOSIS — Z887 Allergy status to serum and vaccine status: Secondary | ICD-10-CM

## 2024-10-29 DIAGNOSIS — Z91018 Allergy to other foods: Secondary | ICD-10-CM

## 2024-10-29 DIAGNOSIS — F410 Panic disorder [episodic paroxysmal anxiety] without agoraphobia: Secondary | ICD-10-CM | POA: Diagnosis present

## 2024-10-29 DIAGNOSIS — Z6837 Body mass index (BMI) 37.0-37.9, adult: Secondary | ICD-10-CM | POA: Diagnosis not present

## 2024-10-29 DIAGNOSIS — T68XXXD Hypothermia, subsequent encounter: Secondary | ICD-10-CM | POA: Diagnosis not present

## 2024-10-29 DIAGNOSIS — R531 Weakness: Secondary | ICD-10-CM | POA: Diagnosis present

## 2024-10-29 DIAGNOSIS — L89323 Pressure ulcer of left buttock, stage 3: Secondary | ICD-10-CM | POA: Diagnosis present

## 2024-10-29 DIAGNOSIS — E66812 Obesity, class 2: Secondary | ICD-10-CM | POA: Diagnosis present

## 2024-10-29 DIAGNOSIS — Z882 Allergy status to sulfonamides status: Secondary | ICD-10-CM

## 2024-10-29 DIAGNOSIS — I10 Essential (primary) hypertension: Secondary | ICD-10-CM | POA: Diagnosis not present

## 2024-10-29 DIAGNOSIS — N179 Acute kidney failure, unspecified: Secondary | ICD-10-CM | POA: Diagnosis present

## 2024-10-29 DIAGNOSIS — Z79899 Other long term (current) drug therapy: Secondary | ICD-10-CM

## 2024-10-29 DIAGNOSIS — Z7189 Other specified counseling: Secondary | ICD-10-CM | POA: Diagnosis not present

## 2024-10-29 DIAGNOSIS — Z515 Encounter for palliative care: Secondary | ICD-10-CM

## 2024-10-29 DIAGNOSIS — Z91041 Radiographic dye allergy status: Secondary | ICD-10-CM

## 2024-10-29 DIAGNOSIS — T68XXXA Hypothermia, initial encounter: Principal | ICD-10-CM

## 2024-10-29 DIAGNOSIS — I959 Hypotension, unspecified: Secondary | ICD-10-CM | POA: Diagnosis present

## 2024-10-29 DIAGNOSIS — L8990 Pressure ulcer of unspecified site, unspecified stage: Secondary | ICD-10-CM | POA: Insufficient documentation

## 2024-10-29 DIAGNOSIS — A4101 Sepsis due to Methicillin susceptible Staphylococcus aureus: Principal | ICD-10-CM | POA: Diagnosis present

## 2024-10-29 DIAGNOSIS — R7881 Bacteremia: Secondary | ICD-10-CM | POA: Diagnosis not present

## 2024-10-29 DIAGNOSIS — H5702 Anisocoria: Secondary | ICD-10-CM | POA: Diagnosis present

## 2024-10-29 DIAGNOSIS — K449 Diaphragmatic hernia without obstruction or gangrene: Secondary | ICD-10-CM | POA: Diagnosis present

## 2024-10-29 DIAGNOSIS — F418 Other specified anxiety disorders: Secondary | ICD-10-CM | POA: Diagnosis not present

## 2024-10-29 DIAGNOSIS — N184 Chronic kidney disease, stage 4 (severe): Secondary | ICD-10-CM | POA: Diagnosis present

## 2024-10-29 DIAGNOSIS — R4182 Altered mental status, unspecified: Secondary | ICD-10-CM | POA: Diagnosis not present

## 2024-10-29 DIAGNOSIS — Z8249 Family history of ischemic heart disease and other diseases of the circulatory system: Secondary | ICD-10-CM

## 2024-10-29 DIAGNOSIS — E872 Acidosis, unspecified: Secondary | ICD-10-CM | POA: Diagnosis not present

## 2024-10-29 DIAGNOSIS — R6520 Severe sepsis without septic shock: Secondary | ICD-10-CM | POA: Diagnosis not present

## 2024-10-29 DIAGNOSIS — R5381 Other malaise: Secondary | ICD-10-CM | POA: Diagnosis present

## 2024-10-29 DIAGNOSIS — H5462 Unqualified visual loss, left eye, normal vision right eye: Secondary | ICD-10-CM | POA: Diagnosis present

## 2024-10-29 LAB — CBC WITH DIFFERENTIAL/PLATELET
Abs Immature Granulocytes: 0.43 K/uL — ABNORMAL HIGH (ref 0.00–0.07)
Basophils Absolute: 0 K/uL (ref 0.0–0.1)
Basophils Relative: 0 %
Eosinophils Absolute: 0 K/uL (ref 0.0–0.5)
Eosinophils Relative: 0 %
HCT: 29.8 % — ABNORMAL LOW (ref 36.0–46.0)
Hemoglobin: 8.9 g/dL — ABNORMAL LOW (ref 12.0–15.0)
Immature Granulocytes: 3 %
Lymphocytes Relative: 1 %
Lymphs Abs: 0.1 K/uL — ABNORMAL LOW (ref 0.7–4.0)
MCH: 25.8 pg — ABNORMAL LOW (ref 26.0–34.0)
MCHC: 29.9 g/dL — ABNORMAL LOW (ref 30.0–36.0)
MCV: 86.4 fL (ref 80.0–100.0)
Monocytes Absolute: 1.6 K/uL — ABNORMAL HIGH (ref 0.1–1.0)
Monocytes Relative: 12 %
Neutro Abs: 11.1 K/uL — ABNORMAL HIGH (ref 1.7–7.7)
Neutrophils Relative %: 84 %
Platelets: 264 K/uL (ref 150–400)
RBC: 3.45 MIL/uL — ABNORMAL LOW (ref 3.87–5.11)
RDW: 19.5 % — ABNORMAL HIGH (ref 11.5–15.5)
WBC: 13.3 K/uL — ABNORMAL HIGH (ref 4.0–10.5)
nRBC: 0.2 % (ref 0.0–0.2)

## 2024-10-29 LAB — RESP PANEL BY RT-PCR (RSV, FLU A&B, COVID)  RVPGX2
Influenza A by PCR: NEGATIVE
Influenza B by PCR: NEGATIVE
Resp Syncytial Virus by PCR: NEGATIVE
SARS Coronavirus 2 by RT PCR: NEGATIVE

## 2024-10-29 LAB — COMPREHENSIVE METABOLIC PANEL WITH GFR
ALT: 24 U/L (ref 0–44)
AST: 20 U/L (ref 15–41)
Albumin: 3.2 g/dL — ABNORMAL LOW (ref 3.5–5.0)
Alkaline Phosphatase: 124 U/L (ref 38–126)
Anion gap: 17 — ABNORMAL HIGH (ref 5–15)
BUN: 61 mg/dL — ABNORMAL HIGH (ref 8–23)
CO2: 15 mmol/L — ABNORMAL LOW (ref 22–32)
Calcium: 8.5 mg/dL — ABNORMAL LOW (ref 8.9–10.3)
Chloride: 107 mmol/L (ref 98–111)
Creatinine, Ser: 2.76 mg/dL — ABNORMAL HIGH (ref 0.44–1.00)
GFR, Estimated: 16 mL/min — ABNORMAL LOW (ref >60)
Glucose, Bld: 113 mg/dL — ABNORMAL HIGH (ref 70–99)
Potassium: 5.1 mmol/L (ref 3.5–5.1)
Sodium: 139 mmol/L (ref 135–145)
Total Bilirubin: 0.2 mg/dL (ref 0.0–1.2)
Total Protein: 7 g/dL (ref 6.5–8.1)

## 2024-10-29 LAB — I-STAT CG4 LACTIC ACID, ED
Lactic Acid, Venous: 1.6 mmol/L (ref 0.5–1.9)
Lactic Acid, Venous: 1.2 mmol/L (ref 0.5–1.9)

## 2024-10-29 LAB — PROTIME-INR
INR: 1.2 (ref 0.8–1.2)
Prothrombin Time: 15.7 s — ABNORMAL HIGH (ref 11.4–15.2)

## 2024-10-29 MED ORDER — POLYVINYL ALCOHOL 1.4 % OP SOLN: 1.0000 [drp] | Freq: Three times a day (TID) | OPHTHALMIC | Status: DC | PRN

## 2024-10-29 MED ORDER — LACTATED RINGERS IV BOLUS (SEPSIS)
1000.0000 mL | Freq: Once | INTRAVENOUS | Status: AC
  Administered 2024-10-29: 16:00:00 1000 mL via INTRAVENOUS

## 2024-10-29 MED ORDER — SODIUM CHLORIDE 0.9 % IV SOLN
2.0000 g | Freq: Once | INTRAVENOUS | Status: AC
  Administered 2024-10-29: 15:00:00 2 g via INTRAVENOUS
  Filled 2024-10-29: qty 20

## 2024-10-29 MED ORDER — LEVOTHYROXINE SODIUM 88 MCG PO TABS
88.0000 ug | ORAL_TABLET | Freq: Every day | ORAL | Status: DC
  Administered 2024-10-30 – 2024-11-07 (×9): 88 ug via ORAL
  Filled 2024-10-29 (×9): qty 1

## 2024-10-29 MED ORDER — GUAIFENESIN 100 MG/5ML PO LIQD
5.0000 mL | ORAL | Status: DC | PRN
  Administered 2024-11-02 – 2024-11-07 (×10): 5 mL via ORAL
  Filled 2024-10-29 (×11): qty 10

## 2024-10-29 MED ORDER — ROSUVASTATIN CALCIUM 10 MG PO TABS
10.0000 mg | ORAL_TABLET | ORAL | Status: DC
  Administered 2024-10-29 – 2024-11-05 (×3): 10 mg via ORAL
  Filled 2024-10-29 (×3): qty 1

## 2024-10-29 MED ORDER — METOCLOPRAMIDE HCL 5 MG/ML IJ SOLN
5.0000 mg | Freq: Once | INTRAMUSCULAR | Status: AC
  Administered 2024-10-29: 16:00:00 5 mg via INTRAVENOUS
  Filled 2024-10-29: qty 2

## 2024-10-29 MED ORDER — ONDANSETRON HCL 4 MG PO TABS: 4.0000 mg | ORAL_TABLET | Freq: Four times a day (QID) | ORAL | Status: DC | PRN

## 2024-10-29 MED ORDER — LACTATED RINGERS IV SOLN: INTRAVENOUS | Status: AC

## 2024-10-29 MED ORDER — SERTRALINE HCL 25 MG PO TABS
25.0000 mg | ORAL_TABLET | Freq: Every day | ORAL | Status: DC
  Administered 2024-10-30 – 2024-11-08 (×10): 25 mg via ORAL
  Filled 2024-10-29 (×9): qty 1

## 2024-10-29 MED ORDER — ACETAMINOPHEN 650 MG RE SUPP: 650.0000 mg | Freq: Four times a day (QID) | RECTAL | Status: DC | PRN

## 2024-10-29 MED ORDER — POLYETHYL GLYC-PROPYL GLYC PF 0.4-0.3 % OP SOLN: 1.0000 [drp] | Freq: Three times a day (TID) | OPHTHALMIC | Status: DC | PRN

## 2024-10-29 MED ORDER — BISACODYL 5 MG PO TBEC: 5.0000 mg | DELAYED_RELEASE_TABLET | Freq: Every day | ORAL | Status: DC | PRN

## 2024-10-29 MED ORDER — POTASSIUM CHLORIDE CRYS ER 10 MEQ PO TBCR
10.0000 meq | EXTENDED_RELEASE_TABLET | Freq: Every day | ORAL | Status: DC
  Administered 2024-10-30 – 2024-10-31 (×2): 10 meq via ORAL
  Filled 2024-10-29 (×2): qty 1

## 2024-10-29 MED ORDER — ENOXAPARIN SODIUM 30 MG/0.3ML IJ SOSY
30.0000 mg | PREFILLED_SYRINGE | INTRAMUSCULAR | Status: DC
  Administered 2024-10-29: 20:00:00 30 mg via SUBCUTANEOUS
  Filled 2024-10-29: qty 0.3

## 2024-10-29 MED ORDER — ACETAMINOPHEN 325 MG PO TABS
650.0000 mg | ORAL_TABLET | Freq: Four times a day (QID) | ORAL | Status: DC | PRN
  Administered 2024-10-31: 650 mg via ORAL
  Filled 2024-10-29: qty 2

## 2024-10-29 MED ORDER — ONDANSETRON HCL 4 MG/2ML IJ SOLN
4.0000 mg | Freq: Four times a day (QID) | INTRAMUSCULAR | Status: DC | PRN
  Administered 2024-10-30: 05:00:00 4 mg via INTRAVENOUS
  Filled 2024-10-29 (×2): qty 2

## 2024-10-29 MED ORDER — SODIUM CHLORIDE 0.9 % IV SOLN
500.0000 mg | Freq: Once | INTRAVENOUS | Status: AC
  Administered 2024-10-29: 15:00:00 500 mg via INTRAVENOUS
  Filled 2024-10-29: qty 5

## 2024-10-29 MED ORDER — PREDNISONE 5 MG PO TABS
5.0000 mg | ORAL_TABLET | Freq: Every day | ORAL | Status: DC
  Administered 2024-10-30: 08:00:00 5 mg via ORAL
  Filled 2024-10-29: qty 1

## 2024-10-29 MED ORDER — SENNOSIDES-DOCUSATE SODIUM 8.6-50 MG PO TABS
1.0000 | ORAL_TABLET | Freq: Every evening | ORAL | Status: DC | PRN
  Administered 2024-10-31: 1 via ORAL
  Filled 2024-10-29: qty 1

## 2024-10-29 NOTE — ED Notes (Signed)
 US  PIV placed L upper arm, 20g 1.88

## 2024-10-29 NOTE — H&P (Signed)
 History and Physical  ADELIN VENTRELLA FMW:[id number] DOB: Sep 16, 1941 DOA: 10/29/2024  PCP: Nichole Senior, MD   Chief Complaint: Generalized weakness, cough  HPI: Jody Shaw is a 83 y.o. female with medical history significant for giant cell arteritis, hypothyroidism, CAD, GERD, lymphedema, HTN, HLD and anxiety who was sent by her PCP for evaluation and management of pneumonia and elevated creatinine. Per daughters, patient has had persistent cough over the last few weeks but has worsened over the last week and become more productive. She was seen by her PCP today for evaluation and blood work showed elevated creatinine, vitals showed hypothermia and a chest x-ray showed pneumonia. EMS was called to transport patient to the ER for further evaluation and management.  At the bedside, patient reports feeling significantly weak with dry mouth but no other complaints.  According to daughters, patient lost her husband 1 to 2 years ago and has been grieving with intermittent hallucinations.  She currently lives by herself but her daughters help with her ADLs.  She has difficulty with ambulation due to her lymphedema but ambulates with a walker.  ED Course: Initial vitals show temp 91.1, RR 17-23, HR 60-80s, BP 105-49, SpO2 100% on room air. Initial labs significant for BUNs/creatinine 61/2.76, bicarb 15, anion gap 17, WBC 13.3, Hgb 8.9, lactic acid 1.6-1.2, INR 1.2, negative flu, RSV and COVID test. CXR shows no active disease.  CT chest shows no acute intrathoracic pathology but shows a moderate size hiatal hernia.  Sepsis protocol was activated and patient received IV LR boluses, IV Rocephin , IV azithromycin  and IV Reglan . TRH was consulted for admission.   Review of Systems: Please see HPI for pertinent positives and negatives. A complete 10 system review of systems are otherwise negative.  Past Medical History:  Diagnosis Date   Coronary artery disease    s/p rotational atherectomy to the LAD  in 1996 // Nuc 9/13: no ischemia   Echocardiogram    Echo 9/19:  EF 55-60, normal wall motion, grade 1 diastolic dysfunction, MAC   GERD (gastroesophageal reflux disease)    Giant cell arteritis (HCC)    HLD (hyperlipidemia)    Hypothyroidism    Vision loss of left eye    Past Surgical History:  Procedure Laterality Date   ARTERY BIOPSY Left 08/03/2017   Procedure: BIOPSY TEMPORAL ARTERY;  Surgeon: Stevie, Herlene Righter, MD;  Location: [facility];  Service: General;  Laterality: Left;   Social History:  reports that she has quit smoking. She has never used smokeless tobacco. She reports that she does not drink alcohol  and does not use drugs.  Allergies[1]  Family History  Problem Relation Age of Onset   Heart disease Mother    Heart disease Father      Prior to Admission medications  Medication Sig Start Date End Date Taking? Authorizing Provider  aspirin  81 MG tablet Take 81 mg by mouth as needed.    [provider]  atenolol  (TENORMIN ) 25 MG tablet TAKE 1 TABLET BY MOUTH EVERY DAY. 11/09/23   Jerilynn Lamarr HERO, NP  calcium  carbonate (TUMS - DOSED IN MG ELEMENTAL CALCIUM ) 500 MG chewable tablet Chew 1 tablet by mouth as needed for indigestion or heartburn. Per patient taking every 2 days    [provider]  ferrous sulfate 324 MG TBEC Take 324 mg by mouth daily.    [provider]  isosorbide  mononitrate (IMDUR ) 30 MG 24 hr tablet TAKE 1 TABLET BY MOUTH EVERY EVENING 03/11/24  [name], [name], DO  levothyroxine  (SYNTHROID , LEVOTHROID) 88 MCG tablet Take 88 mcg by mouth daily before breakfast.    [provider]  Multiple Vitamin (MULTI VITAMIN DAILY) TABS Take 2 tablets by mouth daily.     [provider]  nitroGLYCERIN  (NITROSTAT ) 0.4 MG SL tablet Place 1 tablet (0.4 mg total) under the tongue every 5 (five) minutes as needed for chest pain. 10/19/22   Lucien Orren SAILOR, PA-C  potassium chloride  SA (K-DUR) 20 MEQ tablet Take 1 tablet (20 mEq  total) by mouth daily. Patient not taking: Reported on 03/11/2024 05/09/19   Claudene Victory ORN, MD  rosuvastatin  (CRESTOR ) 10 MG tablet Take 1 tablet by mouth 2 times a week. 11/09/23   Jerilynn Lamarr HERO, NP  sertraline  (ZOLOFT ) 25 MG tablet Take 25 mg by mouth daily. 02/15/24   [provider]  sodium chloride  (MURO 128) 5 % ophthalmic solution Place 1 drop into the right eye every 4 (four) hours as needed for eye irritation.    [provider]  triamcinolone cream (KENALOG) 0.1 % Apply topically as needed.    [provider]    Physical Exam: BP (!) 105/49 (BP Location: Left Arm)   Pulse 62   Temp (!) 93.1 F (33.9 C) (Rectal)   Resp 17   SpO2 100%  General: Pleasant, weak-appearing elderly woman laying in bed. No acute distress. HEENT: [place]/AT. Anicteric sclera.  Left eye blindness. CV: RRR. No murmurs, rubs, or gallops. Pulmonary: Lungs CTAB. Normal effort. No wheezing or rales. Decreased breath sounds throughout. Abdominal: Soft, nontender, nondistended. Normal bowel sounds. Extremities: Chronic lymphedema of the lower extremities with both legs wrapped with bandage. Mild tenderness to palpation of the legs. Skin: Warm and dry. No obvious rash or lesions. Neuro: A&Ox3. Moves all extremities. Normal sensation to light touch. No focal deficit. Psych: Slightly depressed          Labs on Admission:  Basic Metabolic Panel: Recent Labs  Lab 10/29/24 1441  NA 139  K 5.1  CL 107  CO2 15*  GLUCOSE 113*  BUN 61*  CREATININE 2.76*  CALCIUM  8.5*   Liver Function Tests: Recent Labs  Lab 10/29/24 1441  AST 20  ALT 24  ALKPHOS 124  BILITOT 0.2  PROT 7.0  ALBUMIN 3.2*   No results for input(s): LIPASE, AMYLASE in the last 168 hours. No results for input(s): AMMONIA in the last 168 hours. CBC: Recent Labs  Lab 10/29/24 1441  WBC 13.3*  NEUTROABS 11.1*  HGB 8.9*  HCT 29.8*  MCV 86.4  PLT 264   Cardiac Enzymes: No results for input(s):  CKTOTAL, CKMB, CKMBINDEX, TROPONINI in the last 168 hours. BNP (last 3 results) No results for input(s): BNP in the last 8760 hours.  ProBNP (last 3 results) No results for input(s): PROBNP in the last 8760 hours.  CBG: No results for input(s): GLUCAP in the last 168 hours.  Radiological Exams on Admission: CT Chest Wo Contrast Result Date: 10/29/2024 CLINICAL DATA:  Nontraumatic chest wall pain. Concern for inflammatory/infectious etiology. EXAM: CT CHEST WITHOUT CONTRAST TECHNIQUE: Multidetector CT imaging of the chest was performed following the standard protocol without IV contrast. RADIATION DOSE REDUCTION: This exam was performed according to the departmental dose-optimization program which includes automated exposure control, adjustment of the mA and/or kV according to patient size and/or use of iterative reconstruction technique. COMPARISON:  Chest radiograph dated 10/29/2024. FINDINGS: Evaluation of this exam is limited in the absence of intravenous contrast. Cardiovascular: There  is no cardiomegaly or pericardial effusion. There is coronary vascular calcification and calcification of the mitral annulus. Moderate atherosclerotic calcification of the thoracic aorta. No aneurysmal dilatation. The central pulmonary arteries are grossly unremarkable. Mediastinum/Nodes: No hilar or mediastinal adenopathy. Moderate size hiatal hernia. The esophagus is grossly unremarkable no mediastinal fluid collection. Lungs/Pleura: Bibasilar subpleural atelectasis. Biapical subpleural linear scarring. No consolidative changes. There is no pleural effusion pneumothorax. The central airways are patent. Upper Abdomen: No acute abnormality. Musculoskeletal: Osteopenia with degenerative changes of the spine. No acute osseous pathology. No fluid collection. IMPRESSION: 1. No acute intrathoracic pathology. 2. Moderate size hiatal hernia. 3.  Aortic Atherosclerosis (ICD10-I70.0). Electronically Signed   By:  Vanetta Chou M.D.   On: 10/29/2024 17:01   DG Chest Port 1 View Result Date: 10/29/2024 EXAM: 1 VIEW(S) XRAY OF THE CHEST 10/29/2024 02:46:00 PM COMPARISON: Comparison with 03/24/2024. CLINICAL HISTORY: Questionable sepsis - evaluate for abnormality. FINDINGS: LUNGS AND PLEURA: Shallow inspiration with elevation of the right hemidiaphragm. Mild linear atelectasis or scarring in the lung bases. No airspace disease or consolidation. No pleural effusion or pneumothorax. HEART AND MEDIASTINUM: Moderate-sized hiatal hernia behind the heart. Heart size and pulmonary vascularity are normal. Mediastinal contours appear intact. BONES AND SOFT TISSUES: Degenerative changes in the spine and shoulders. No acute osseous abnormality. IMPRESSION: 1. No acute cardiopulmonary abnormality. Electronically signed by: Elsie Gravely MD 10/29/2024 03:17 PM EST RP Workstation: [id number]   Independent interpretation of EKG: Sinus rhythm with RBBB and nonspecific T wave abnormalities  Assessment/Plan Jody Shaw is a 83 y.o. female with medical history significant for giant cell arteritis, hypothyroidism, CAD, GERD, lymphedema, HTN, HLD and anxiety who was sent by her PCP for evaluation and management of pneumonia and elevated creatinine and admitted for sepsis.  # Sepsis # Community-acquired pneumonia - Pt presented with with generalized weakness and productive cough - CX at PCP office showed pneumonia however repeat CXR and CT chest on admission with no acute abnormalities. - Met sepsis criteria with hypotension, hypothermia, leukocytosis and a respiratory infection - Continue IV Rocephin  and azithromycin  - Continue IV LR 150 cc/h - Follow-up blood culture and sputum culture - Check MRSA screen, full RVP, procalcitonin, urinary Legionella and strep pneumo - Trend CBC, fever curve  # AKI on CKD stage 3B - Patient seem to have had progression of her CKD over the last year, likely to stage IV - Recent  baseline creatinine around 1.6-1.7, creatinine elevated to 2.76 on admission - Continue IV hydration as above - Trend renal function and avoid nephrotoxic agents  # Hypothermia, improved - Patient found to have a temperature of 91.1 on admission  - Placed on Bair hugger with improvement to 97 - Closely monitor vitals  # HTN - BP remains soft with SBP in the 90-1 100s - Hold BP meds in the setting of hypotension, resume as able  # HLD - Continue rosuvastatin   # Chronic lymphedema - Patient with chronic lower extremity edema due to lymphedema - Lower extremity completely wrapped with bandage - Wound care consulted for possible Unna boots  # Hx of giant cell arteritis - Has loss of vision in the left eye due to this - Continue prednisone   # Hypothyroidism - Continue Synthroid   # Anxiety and depression - Continue sertraline   # Generalized weakness - In the setting of acute illness - PT/OT eval and treat - Fall precautions  # Grief - Reports difficulty adjusting after losing her husband of 66 years last year - Chaplain  consulted for spiritual/emotional support  DVT prophylaxis: Lovenox      Code Status: Full Code  Consults called: None  Family Communication: Discussed admission with daughters at bedside  Severity of Illness: The appropriate patient status for this patient is INPATIENT. Inpatient status is judged to be reasonable and necessary in order to provide the required intensity of service to ensure the patient's safety. The patient's presenting symptoms, physical exam findings, and initial radiographic and laboratory data in the context of their chronic comorbidities is felt to place them at high risk for further clinical deterioration. Furthermore, it is not anticipated that the patient will be medically stable for discharge from the hospital within 2 midnights of admission.   * I certify that at the point of admission it is my clinical judgment that the patient  will require inpatient hospital care spanning beyond 2 midnights from the point of admission due to high intensity of service, high risk for further deterioration and high frequency of surveillance required.*  Level of care: Progressive    Lou Claretta HERO, MD 10/29/2024, 8:41 PM [facility] Pager: [phone] Isaiah 41:10   If 7PM-7AM, please contact night-coverage [email or web address] Password [email or web address]     [1]  Allergies Allergen Reactions   Ace Inhibitors Other (See Comments)    Severe AKI from dehydration and ARB   Angiotensin Receptor Blockers Other (See Comments)    Severe AKI from dehydration and ARB   Iodine     Other Reaction(s): Unknown   Sulfa Antibiotics     Other Reaction(s): Unknown   Sulfamethoxazole-Trimethoprim     Other Reaction(s): Unknown   Tetanus Toxoid, Adsorbed     Other Reaction(s): Unknown   Watermelon [Citrullus Vulgaris]

## 2024-10-29 NOTE — ED Notes (Signed)
 RN to room , pt here for the below complaint, RN completed triage intake, IV done, labs at bedside, tech is placing pt on cardiac monitor, bp cuff  and pulse ox, pt a/o x4 , waiting on provider, bed low and locked, rails x2 ,call light in reach, pt placed on purewick. While putting pt on purewick is she noted to have a sore, area of redness on her left buttocks, pt reports she doesn't move often, RN cleaned area and placed a sacral patch, tech aware 2 hour turning will be initiated   . Chief Complaint  Patient presents with   Weakness    Pt dx with PNA today @ PCP,  pt coming from PCP with c/o weakness- generalized, malaise, hallucination, abnormal lab with creatinine of 3. Pt reports illness x 1 week, a/o x4.

## 2024-10-29 NOTE — Progress Notes (Signed)
 Elink following for sepsis protocol.

## 2024-10-29 NOTE — ED Provider Notes (Signed)
 [place] EMERGENCY DEPARTMENT AT [facility] Provider Note   CSN: [id number] Arrival date & time: 10/29/24  1344     Patient presents with: Weakness (Pt dx with PNA today @ PCP,  pt coming from PCP with c/o weakness- generalized, malaise, hallucination, abnormal lab with creatinine of 3. Pt reports illness x 1 week, a/o x4.  )   Jody Shaw is a 83 y.o. female.   83 year old who presents from her physician's office has been diagnosed with bilateral pneumonia as well as possible sepsis.  According to patient and daughter patient has been sick for several days.  Has had increased cough congestion.  No reported vomiting or diarrhea.  According to the office records, patient had a chest x-ray which showed pneumonia.  She is also found to have acute kidney injury with a creatinine over 2.  Was also found to be hypothermic.  Blood pressure was stable at that time.  Was sent to for further management       Prior to Admission medications  Medication Sig Start Date End Date Taking? Authorizing Provider  aspirin  81 MG tablet Take 81 mg by mouth as needed.    [provider]  atenolol  (TENORMIN ) 25 MG tablet TAKE 1 TABLET BY MOUTH EVERY DAY. 11/09/23   Jerilynn Lamarr HERO, NP  calcium  carbonate (TUMS - DOSED IN MG ELEMENTAL CALCIUM ) 500 MG chewable tablet Chew 1 tablet by mouth as needed for indigestion or heartburn. Per patient taking every 2 days    [provider]  ferrous sulfate 324 MG TBEC Take 324 mg by mouth daily.    [provider]  isosorbide  mononitrate (IMDUR ) 30 MG 24 hr tablet TAKE 1 TABLET BY MOUTH EVERY EVENING 03/11/24   [name], [name], DO  levothyroxine  (SYNTHROID , LEVOTHROID) 88 MCG tablet Take 88 mcg by mouth daily before breakfast.    [provider]  Multiple Vitamin (MULTI VITAMIN DAILY) TABS Take 2 tablets by mouth daily.     [provider]  nitroGLYCERIN  (NITROSTAT ) 0.4 MG SL tablet Place 1 tablet (0.4 mg total)  under the tongue every 5 (five) minutes as needed for chest pain. 10/19/22   Lucien Orren SAILOR, PA-C  potassium chloride  SA (K-DUR) 20 MEQ tablet Take 1 tablet (20 mEq total) by mouth daily. Patient not taking: Reported on 03/11/2024 05/09/19   Claudene Victory ORN, MD  rosuvastatin  (CRESTOR ) 10 MG tablet Take 1 tablet by mouth 2 times a week. 11/09/23   Jerilynn Lamarr HERO, NP  sertraline  (ZOLOFT ) 25 MG tablet Take 25 mg by mouth daily. 02/15/24   [provider]  sodium chloride  (MURO 128) 5 % ophthalmic solution Place 1 drop into the right eye every 4 (four) hours as needed for eye irritation.    [provider]  triamcinolone cream (KENALOG) 0.1 % Apply topically as needed.    [provider]    Allergies: Ace inhibitors, Angiotensin receptor blockers, Iodine, Septra [sulfamethoxazole-trimethoprim], Sulfa antibiotics, and Watermelon [citrullus vulgaris]    Review of Systems  All other systems reviewed and are negative.   Updated Vital Signs BP (!) 105/49 (BP Location: Left Arm)   Pulse 62   Temp (!) 91.1 F (32.8 C) (Rectal)   Resp 17   SpO2 100%   Physical Exam Vitals and nursing note reviewed.  Constitutional:      General: She is not in acute distress.    Appearance: Normal appearance. She is well-developed. She is not toxic-appearing.  HENT:  Head: Normocephalic and atraumatic.  Eyes:     General: Lids are normal.     Conjunctiva/sclera: Conjunctivae normal.     Pupils: Pupils are equal, round, and reactive to light.  Neck:     Thyroid: No thyroid mass.     Trachea: No tracheal deviation.  Cardiovascular:     Rate and Rhythm: Normal rate and regular rhythm.     Heart sounds: Normal heart sounds. No murmur heard.    No gallop.  Pulmonary:     Effort: Pulmonary effort is normal. No respiratory distress.     Breath sounds: No stridor. Examination of the right-lower field reveals decreased breath sounds. Examination of the left-lower field reveals  decreased breath sounds. Decreased breath sounds present. No wheezing, rhonchi or rales.  Abdominal:     General: There is no distension.     Palpations: Abdomen is soft.     Tenderness: There is no abdominal tenderness. There is no rebound.  Musculoskeletal:        General: No tenderness. Normal range of motion.     Cervical back: Normal range of motion and neck supple.  Skin:    General: Skin is warm and dry.     Findings: No abrasion or rash.  Neurological:     Mental Status: She is alert and oriented to person, place, and time. Mental status is at baseline.     GCS: GCS eye subscore is 4. GCS verbal subscore is 5. GCS motor subscore is 6.     Cranial Nerves: No cranial nerve deficit.     Sensory: No sensory deficit.     Motor: Motor function is intact.  Psychiatric:        Attention and Perception: Attention normal.        Speech: Speech normal.        Behavior: Behavior normal.     (all labs ordered are listed, but only abnormal results are displayed) Labs Reviewed  RESP PANEL BY RT-PCR (RSV, FLU A&B, COVID)  [id number]  CULTURE, BLOOD (ROUTINE X 2)  CULTURE, BLOOD (ROUTINE X 2)  COMPREHENSIVE METABOLIC PANEL WITH GFR  CBC WITH DIFFERENTIAL/PLATELET  PROTIME-INR  URINALYSIS, W/ REFLEX TO CULTURE (INFECTION SUSPECTED)  I-STAT CG4 LACTIC ACID, ED    EKG: None  Radiology: No results found.   Procedures   Medications Ordered in the ED  lactated ringers  infusion (has no administration in time range)  cefTRIAXone  (ROCEPHIN ) 2 g in sodium chloride  0.9 % 100 mL IVPB (has no administration in time range)  azithromycin  (ZITHROMAX ) 500 mg in sodium chloride  0.9 % 250 mL IVPB (has no administration in time range)  lactated ringers  bolus 1,000 mL (has no administration in time range)    And  lactated ringers  bolus 1,000 mL (has no administration in time range)    And  lactated ringers  bolus 1,000 mL (has no administration in time range)                                     Medical Decision Making Amount and/or Complexity of Data Reviewed Labs: ordered. Radiology: ordered.  Risk Prescription drug management.   Patient placed on Bair hugger due to being hypothermic.  Cold sepsis protocol started.  Patient given 30 cc/kg bolus of fluid along with antibiotics for commune acquired pneumonia based on her doctor's note that came from the office.  Chest x-ray did not show any pneumonia  here.  CT chest ordered for that.  Patient's lactate is normal.  Electrolytes show that patient has chronic kidney disease.  Does have leukocytosis on her CBC.  Will consult hospitalist for admission  CRITICAL CARE Performed by: Curtistine ONEIDA Dawn Total critical care time: 45 minutes Critical care time was exclusive of separately billable procedures and treating other patients. Critical care was necessary to treat or prevent imminent or life-threatening deterioration. Critical care was time spent personally by me on the following activities: development of treatment plan with patient and/or surrogate as well as nursing, discussions with consultants, evaluation of patient's response to treatment, examination of patient, obtaining history from patient or surrogate, ordering and performing treatments and interventions, ordering and review of laboratory studies, ordering and review of radiographic studies, pulse oximetry and re-evaluation of patient's condition.      Final diagnoses:  None    ED Discharge Orders     None          Dawn Curtistine, MD 10/29/24 1626

## 2024-10-30 DIAGNOSIS — R4182 Altered mental status, unspecified: Secondary | ICD-10-CM

## 2024-10-30 DIAGNOSIS — B9561 Methicillin susceptible Staphylococcus aureus infection as the cause of diseases classified elsewhere: Secondary | ICD-10-CM | POA: Diagnosis not present

## 2024-10-30 DIAGNOSIS — I959 Hypotension, unspecified: Secondary | ICD-10-CM | POA: Diagnosis present

## 2024-10-30 DIAGNOSIS — A419 Sepsis, unspecified organism: Secondary | ICD-10-CM | POA: Diagnosis not present

## 2024-10-30 DIAGNOSIS — R7881 Bacteremia: Secondary | ICD-10-CM

## 2024-10-30 DIAGNOSIS — R6520 Severe sepsis without septic shock: Secondary | ICD-10-CM | POA: Diagnosis not present

## 2024-10-30 LAB — RESPIRATORY PANEL BY PCR

## 2024-10-30 LAB — BLOOD CULTURE ID PANEL (REFLEXED) - BCID2

## 2024-10-30 LAB — URINALYSIS, W/ REFLEX TO CULTURE (INFECTION SUSPECTED)
Bilirubin Urine: NEGATIVE
Glucose, UA: NEGATIVE mg/dL
Hgb urine dipstick: NEGATIVE
Ketones, ur: NEGATIVE mg/dL
Nitrite: NEGATIVE
Protein, ur: NEGATIVE mg/dL
Specific Gravity, Urine: 1.012 (ref 1.005–1.030)
pH: 5 (ref 5.0–8.0)

## 2024-10-30 LAB — ABO/RH: ABO/RH(D): A POS

## 2024-10-30 LAB — BASIC METABOLIC PANEL WITH GFR
Anion gap: 15 (ref 5–15)
BUN: 49 mg/dL — ABNORMAL HIGH (ref 8–23)
CO2: 15 mmol/L — ABNORMAL LOW (ref 22–32)
Calcium: 7.3 mg/dL — ABNORMAL LOW (ref 8.9–10.3)
Chloride: 111 mmol/L (ref 98–111)
Creatinine, Ser: 2.36 mg/dL — ABNORMAL HIGH (ref 0.44–1.00)
GFR, Estimated: 20 mL/min — ABNORMAL LOW (ref >60)
Glucose, Bld: 80 mg/dL (ref 70–99)
Potassium: 4.6 mmol/L (ref 3.5–5.1)
Sodium: 140 mmol/L (ref 135–145)

## 2024-10-30 LAB — PROCALCITONIN: Procalcitonin: 0.28 ng/mL

## 2024-10-30 LAB — HEMOGLOBIN AND HEMATOCRIT, BLOOD
HCT: 28.5 % — ABNORMAL LOW (ref 36.0–46.0)
Hemoglobin: 9 g/dL — ABNORMAL LOW (ref 12.0–15.0)

## 2024-10-30 LAB — CBC
HCT: 20.7 % — ABNORMAL LOW (ref 36.0–46.0)
Hemoglobin: 6.4 g/dL — CL (ref 12.0–15.0)
MCH: 26.3 pg (ref 26.0–34.0)
MCHC: 30.9 g/dL (ref 30.0–36.0)
MCV: 85.2 fL (ref 80.0–100.0)
Platelets: 213 K/uL (ref 150–400)
RBC: 2.43 MIL/uL — ABNORMAL LOW (ref 3.87–5.11)
RDW: 19.9 % — ABNORMAL HIGH (ref 11.5–15.5)
WBC: 12.2 K/uL — ABNORMAL HIGH (ref 4.0–10.5)
nRBC: 0.2 % (ref 0.0–0.2)

## 2024-10-30 LAB — C DIFFICILE QUICK SCREEN W PCR REFLEX
C Diff antigen: NEGATIVE
C Diff interpretation: NOT DETECTED
C Diff toxin: NEGATIVE

## 2024-10-30 LAB — LACTIC ACID, PLASMA
Lactic Acid, Venous: 3.5 mmol/L (ref 0.5–1.9)
Lactic Acid, Venous: 2.9 mmol/L (ref 0.5–1.9)

## 2024-10-30 LAB — PREPARE RBC (CROSSMATCH)

## 2024-10-30 LAB — STREP PNEUMONIAE URINARY ANTIGEN: Strep Pneumo Urinary Antigen: NEGATIVE

## 2024-10-30 LAB — MRSA NEXT GEN BY PCR, NASAL: MRSA by PCR Next Gen: NOT DETECTED

## 2024-10-30 MED ORDER — SODIUM CHLORIDE 0.9 % IV SOLN: 2.0000 g | INTRAVENOUS | Status: DC

## 2024-10-30 MED ORDER — SODIUM CHLORIDE 0.9% IV SOLUTION: Freq: Once | INTRAVENOUS | Status: AC

## 2024-10-30 MED ORDER — MIDODRINE HCL 5 MG PO TABS
10.0000 mg | ORAL_TABLET | Freq: Once | ORAL | Status: AC
  Administered 2024-10-30: 08:00:00 10 mg via ORAL
  Filled 2024-10-30: qty 2

## 2024-10-30 MED ORDER — AZITHROMYCIN 500 MG IV SOLR: 500.0000 mg | INTRAVENOUS | Status: DC

## 2024-10-30 MED ORDER — MIDODRINE HCL 5 MG PO TABS
5.0000 mg | ORAL_TABLET | ORAL | Status: AC
  Administered 2024-10-30: 03:00:00 5 mg via ORAL
  Filled 2024-10-30: qty 1

## 2024-10-30 MED ORDER — CHLORHEXIDINE GLUCONATE CLOTH 2 % EX PADS
6.0000 | MEDICATED_PAD | Freq: Every day | CUTANEOUS | Status: DC
  Administered 2024-10-30 – 2024-11-08 (×10): 6 via TOPICAL

## 2024-10-30 MED ORDER — LACTATED RINGERS IV BOLUS
500.0000 mL | Freq: Once | INTRAVENOUS | Status: AC
  Administered 2024-10-30: 03:00:00 500 mL via INTRAVENOUS

## 2024-10-30 MED ORDER — CEFAZOLIN SODIUM-DEXTROSE 2-4 GM/100ML-% IV SOLN
2.0000 g | Freq: Two times a day (BID) | INTRAVENOUS | Status: DC
  Administered 2024-10-30 – 2024-11-03 (×9): 2 g via INTRAVENOUS
  Filled 2024-10-30 (×10): qty 100

## 2024-10-30 MED ORDER — CEFAZOLIN SODIUM-DEXTROSE 2-4 GM/100ML-% IV SOLN
2.0000 g | Freq: Three times a day (TID) | INTRAVENOUS | Status: DC
  Filled 2024-10-30: qty 100

## 2024-10-30 MED ORDER — SODIUM CHLORIDE 0.9 % IV SOLN: INTRAVENOUS | Status: DC

## 2024-10-30 MED ORDER — ORAL CARE MOUTH RINSE: 15.0000 mL | OROMUCOSAL | Status: DC | PRN

## 2024-10-30 NOTE — Consult Note (Signed)
 Palliative Care Consult Note                                  Date: 10/30/2024   Patient Name: Jody Shaw  DOB: Oct 13, 1941  MRN: [id number]  Age / Sex: 83 y.o., female  PCP: Nichole Senior, MD Referring Physician: Cheryle Page, MD  Reason for Consultation: Establishing goals of care  HPI/Patient Profile: 83 y.o. female  with past medical history of CAD, lymphedema, CKD stage IIIb, anemia, GERD, hypothyroidism, giant cell arteritis, hypothyroidism, HTN, and HLD who was sent to the ED by her PCP on 10/29/2024 for evaluation of possible pneumonia and elevated creatinine.  Chest x-ray at PCPs office showed pneumonia however repeat chest x-ray and CT chest on admission with no acute abnormalities. Patient is admitted with severe sepsis secondary to UTI, concern for pneumonia, lactic acidosis, and AKI on CKD.  Palliative Medicine has been consulted for goals of care discussions. Patient and family are faced with anticipatory care needs and complex medical decision making.   Clinical Assessment and Goals of Care:   Extensive chart review has been completed prior to meeting with patient/family including labs, vital signs, imaging, progress/consult notes, orders, medications and available advance directive documents.    I met with *** to discuss diagnosis, prognosis, GOC, EOL wishes, disposition, and options.  I introduced Palliative Medicine as specialized medical care for people living with serious illness. It focuses on providing relief from the symptoms and stress of a serious illness.   Created space and opportunity for patient and family to express thoughts and feelings regarding current medical situation. Values and goals of care were attempted to be elicited.  A discussion was had today regarding advanced directives. We discussed code status and scopes of care. We discussed the difference between full scope versus limited  interventions versus comfort care. The MOST form was introduced and discussed.   Life Review: ***  Functional Status: ***  Patient/Family Understanding of Illness: ***  Discussion: *** We discussed patient's current illness and what it means in the larger context of his/her ongoing co-morbidities. Current clinical status was reviewed. Natural disease trajectory of *** was discussed.  Discussed the importance of continued conversation with the medical team regarding overall plan of care and treatment options, ensuring decisions are within the context of the patients values and GOCs.  Questions and concerns addressed. Patient/family encouraged to call with questions or concerns.   Review of Systems  Gastrointestinal:  Positive for diarrhea.    Objective:   Primary Diagnoses: Present on Admission:  Severe sepsis without septic shock (HCC)  Anxiety and depression  Hypotension   Physical Exam Vitals reviewed.  Constitutional:      General: She is not in acute distress.    Appearance: She is ill-appearing.  Cardiovascular:     Rate and Rhythm: Normal rate.  Pulmonary:     Effort: Pulmonary effort is normal.  Neurological:     Mental Status: She is alert and oriented to person, place, and time.     Vital Signs:  BP (!) 100/46   Pulse (!) 57   Temp 98.2 F (36.8 C) (Oral)   Resp 14   SpO2 100%   Palliative Assessment/Data: ***     Assessment & Plan:   SUMMARY OF RECOMMENDATIONS   Code status changed to DNR/DNI Goal is for medical stabilization and recovery to the extent possible  Primary Decision Maker: {Primary  Decision Fjxzm:78612}  Existing Vynca/ACP Documentation:   Code Status/Advance Care Planning: {Palliative Code status:23503}  Symptom Management:  ***  Prognosis:  {Palliative Care Prognosis:23504}  Discharge Planning:  {Palliative dispostion:23505}   Discussed with: ***    Thank you for allowing us  to participate in the care of  Terisa A Gura   Time Total: ***  Greater than 50%  of this time was spent counseling and coordinating care related to the above assessment and plan.  Signed by: Recardo Loll, NP Palliative Medicine Team  Team Phone # [phone]  For individual providers, please see AMION

## 2024-10-30 NOTE — Consult Note (Signed)
 [facility]    Date of Admission:  10/29/2024     Reason for Consult: mssa bacteremia    Referring Provider: barbera Sophie Mao     Lines:  Peripheral iv's  Abx: 12/18-c cefazolin   12/17-18 ceftriaxone jennell        Assessment: 83 yo female cad, hypothyroidism, hx giant cell arteritis, gerd, chronic venous stasis, sent 12/17 from pcp for cough and acute on chronic cr elevation, found to have mssa bacteremia and ams  Chest ct clear  12/17 respiratory viral pcr negative 12/17 bcx returned quickly with mssa 12/18 ucx in process  Cr unclear baseline given how much variation this year but 2.8 from 1.7 (back in 03/2024)  Currently altered and will need to further evaluate that along with sites of metastatic involvement once mentation improved  No cardiac device on imaging and no obvious prosthetics otherwise Bilateral venous stasis ?portal of entry   This seems to be all mssa sepsis and will adjust abx to reflect such   Plan: Change abx to cefazolin  Repeat bcx ordered and echo ordered Will also need tee -- have asked cardiology to place on schedule If worsening confusion will get brain mri Maintain standard isolation precaution Discussed with primary team     ------------------------------------------------ Principal Problem:   Sepsis (HCC) Active Problems:   Chronic acquired lymphedema   Anxiety and depression   Hypothermia   Community acquired pneumonia   Generalized weakness   Acute renal failure superimposed on stage 3b chronic kidney disease (HCC)   Hypotension    HPI: Jody Shaw is a 83 y.o. female cad, hypothyroidism, hx giant cell arteritis, gerd, chronic venous stasis, sent 12/17 from pcp for cough and acute on chronic cr elevation, found to have mssa bacteremia and ams  Hx via chart review; patient currently mildly confused  I don't know what baseline mentation is. Chart mentioned that 1 day  prior to admission was seen in pcp clinic and found to have elevated creatinine and patient was hypothermic  Chart also mentioned a few week cough  Brought to ed by ems  Was 91.1 on initial vitals; sbp 100s; satting 100% room air Cxr and chest ct no acute cardiopulm process Started initially on CAP coverage Admission bcx returned within 24 hours mssa so changed abx to cefazolin       Family History  Problem Relation Age of Onset   Heart disease Mother    Heart disease Father     Social History[1]  Allergies[2]  Review of Systems: ROS All Other ROS was negative, except mentioned above   Past Medical History:  Diagnosis Date   Coronary artery disease    s/p rotational atherectomy to the LAD in 1996 // Nuc 9/13: no ischemia   Echocardiogram    Echo 9/19:  EF 55-60, normal wall motion, grade 1 diastolic dysfunction, MAC   GERD (gastroesophageal reflux disease)    Giant cell arteritis (HCC)    HLD (hyperlipidemia)    Hypothyroidism    Vision loss of left eye        Scheduled Meds:  Chlorhexidine  Gluconate Cloth  6 each Topical Daily   levothyroxine   88 mcg Oral Q0600   potassium chloride   10 mEq Oral Daily   predniSONE   5 mg Oral Q breakfast   rosuvastatin   10 mg Oral Once per day on Sunday Wednesday   sertraline   25 mg Oral Daily   Continuous Infusions:  sodium chloride   100 mL/hr at 10/30/24 1513    ceFAZolin  (ANCEF ) IV 2 g (10/30/24 1512)   PRN Meds:.acetaminophen  **OR** acetaminophen , artificial tears, bisacodyl , guaiFENesin , ondansetron  **OR** ondansetron  (ZOFRAN ) IV, mouth rinse, senna-docusate   OBJECTIVE: Blood pressure (!) 104/59, pulse 70, temperature (!) 97.5 F (36.4 C), temperature source Oral, resp. rate 18, SpO2 99%.  Physical Exam  General/constitutional: obese, appears mildly confused, tracking but no full conversation HEENT: Normocephalic, PER, Conj Clear, EOMI, Oropharynx clear Neck supple CV: rrr no mrg Lungs: clear to auscultation,  normal respiratory effort Abd: Soft, Nontender Ext/skin: bilateral lower ext in compression wrap for chronic edema, beyond the dressing is slight erythema but no fluctuance/tenderness or blister Neuro: confused; generalized weakness MSK: no peripheral joint swelling/tenderness/warmth    Lab Results Lab Results  Component Value Date   WBC 12.2 (H) 10/30/2024   HGB 6.4 (LL) 10/30/2024   HCT 20.7 (L) 10/30/2024   MCV 85.2 10/30/2024   PLT 213 10/30/2024    Lab Results  Component Value Date   CREATININE 2.36 (H) 10/30/2024   BUN 49 (H) 10/30/2024   NA 140 10/30/2024   K 4.6 10/30/2024   CL 111 10/30/2024   CO2 15 (L) 10/30/2024    Lab Results  Component Value Date   ALT 24 10/29/2024   AST 20 10/29/2024   ALKPHOS 124 10/29/2024   BILITOT 0.2 10/29/2024      Microbiology: Recent Results (from the past 240 hours)  Resp panel by RT-PCR (RSV, Flu A&B, Covid)     Status: None   Collection Time: 10/29/24  2:31 PM   Specimen: Nasal Swab  Result Value Ref Range Status   SARS Coronavirus 2 by RT PCR NEGATIVE NEGATIVE Final    Comment: (NOTE) SARS-CoV-2 target nucleic acids are NOT DETECTED.  The SARS-CoV-2 RNA is generally detectable in upper respiratory specimens during the acute phase of infection. The lowest concentration of SARS-CoV-2 viral copies this assay can detect is 138 copies/mL. A negative result does not preclude SARS-Cov-2 infection and should not be used as the sole basis for treatment or other patient management decisions. A negative result may occur with  improper specimen collection/handling, submission of specimen other than nasopharyngeal swab, presence of viral mutation(s) within the areas targeted by this assay, and inadequate number of viral copies(<138 copies/mL). A negative result must be combined with clinical observations, patient history, and epidemiological information. The expected result is Negative.  Fact Sheet for Patients:   bloggercourse.com  Fact Sheet for Healthcare Providers:  seriousbroker.it  This test is no t yet approved or cleared by the [place]  FDA and  has been authorized for detection and/or diagnosis of SARS-CoV-2 by FDA under an Emergency Use Authorization (EUA). This EUA will remain  in effect (meaning this test can be used) for the duration of the COVID-19 declaration under Section 564(b)(1) of the Act, 21 U.S.C.section 360bbb-3(b)(1), unless the authorization is terminated  or revoked sooner.       Influenza A by PCR NEGATIVE NEGATIVE Final   Influenza B by PCR NEGATIVE NEGATIVE Final    Comment: (NOTE) The Xpert Xpress SARS-CoV-2/FLU/RSV plus assay is intended as an aid in the diagnosis of influenza from Nasopharyngeal swab specimens and should not be used as a sole basis for treatment. Nasal washings and aspirates are unacceptable for Xpert Xpress SARS-CoV-2/FLU/RSV testing.  Fact Sheet for Patients: bloggercourse.com  Fact Sheet for Healthcare Providers: seriousbroker.it  This test is not yet approved or cleared by the [place]  FDA and has  been authorized for detection and/or diagnosis of SARS-CoV-2 by FDA under an Emergency Use Authorization (EUA). This EUA will remain in effect (meaning this test can be used) for the duration of the COVID-19 declaration under Section 564(b)(1) of the Act, 21 U.S.C. section 360bbb-3(b)(1), unless the authorization is terminated or revoked.     Resp Syncytial Virus by PCR NEGATIVE NEGATIVE Final    Comment: (NOTE) Fact Sheet for Patients: bloggercourse.com  Fact Sheet for Healthcare Providers: seriousbroker.it  This test is not yet approved or cleared by the [place]  FDA and has been authorized for detection and/or diagnosis of SARS-CoV-2 by FDA under an Emergency Use  Authorization (EUA). This EUA will remain in effect (meaning this test can be used) for the duration of the COVID-19 declaration under Section 564(b)(1) of the Act, 21 U.S.C. section 360bbb-3(b)(1), unless the authorization is terminated or revoked.  Performed at [facility], [address], [place], [place] [address]   Respiratory (~20 pathogens) panel by PCR     Status: None   Collection Time: 10/29/24  2:31 PM   Specimen: Nasopharyngeal Swab; Respiratory  Result Value Ref Range Status   Adenovirus NOT DETECTED NOT DETECTED Final   Coronavirus 229E NOT DETECTED NOT DETECTED Final    Comment: (NOTE) The Coronavirus on the Respiratory Panel, DOES NOT test for the novel  Coronavirus (2019 nCoV)    Coronavirus HKU1 NOT DETECTED NOT DETECTED Final   Coronavirus NL63 NOT DETECTED NOT DETECTED Final   Coronavirus OC43 NOT DETECTED NOT DETECTED Final   Metapneumovirus NOT DETECTED NOT DETECTED Final   Rhinovirus / Enterovirus NOT DETECTED NOT DETECTED Final   Influenza A NOT DETECTED NOT DETECTED Final   Influenza B NOT DETECTED NOT DETECTED Final   Parainfluenza Virus 1 NOT DETECTED NOT DETECTED Final   Parainfluenza Virus 2 NOT DETECTED NOT DETECTED Final   Parainfluenza Virus 3 NOT DETECTED NOT DETECTED Final   Parainfluenza Virus 4 NOT DETECTED NOT DETECTED Final   Respiratory Syncytial Virus NOT DETECTED NOT DETECTED Final   Bordetella pertussis NOT DETECTED NOT DETECTED Final   Bordetella Parapertussis NOT DETECTED NOT DETECTED Final   Chlamydophila pneumoniae NOT DETECTED NOT DETECTED Final   Mycoplasma pneumoniae NOT DETECTED NOT DETECTED Final    Comment: Performed at [facility], [address], [place], [place] [address]  Blood Culture (routine x 2)     Status: None (Preliminary result)   Collection Time: 10/29/24  3:25 PM   Specimen: BLOOD  Result Value Ref Range Status   Specimen Description   Final    BLOOD SITE NOT SPECIFIED Performed at  [facility], [address] [place], [place] [address]    Special Requests   Final    BOTTLES DRAWN AEROBIC AND ANAEROBIC Blood Culture results may not be optimal due to an inadequate volume of blood received in culture bottles Performed at [facility], [address] [place], [place] [address]    Culture  Setup Time   Final    GRAM POSITIVE COCCI IN CLUSTERS IN BOTH AEROBIC AND ANAEROBIC BOTTLES CRITICAL VALUE NOTED.  VALUE IS CONSISTENT WITH PREVIOUSLY REPORTED AND CALLED VALUE. Performed at [facility], [address] [place], [place] [address]    Culture GRAM POSITIVE COCCI  Final   Report Status PENDING  Incomplete  Blood Culture (routine x 2)     Status: None (Preliminary result)   Collection Time: 10/29/24  3:40 PM  Specimen: BLOOD  Result Value Ref Range Status   Specimen Description   Final    BLOOD SITE NOT SPECIFIED Performed at [facility], [address], [place], [place] [address]    Special Requests   Final    BOTTLES DRAWN AEROBIC AND ANAEROBIC Blood Culture results may not be optimal due to an inadequate volume of blood received in culture bottles Performed at [facility] [address], [place], [place] [address]    Culture  Setup Time   Final    GRAM POSITIVE COCCI IN CLUSTERS IN BOTH AEROBIC AND ANAEROBIC BOTTLES CRITICAL RESULT CALLED TO, READ BACK BY AND VERIFIED WITH: MAYA LOUANN LABOR 1327 [id number] FCP Performed at [facility], [address], [place], [place] [address]    Culture GRAM POSITIVE COCCI  Final   Report Status PENDING  Incomplete  Blood Culture ID Panel (Reflexed)     Status: Abnormal   Collection Time: 10/29/24  3:40 PM  Result Value Ref Range Status   Enterococcus faecalis NOT DETECTED NOT DETECTED Final   Enterococcus Faecium NOT DETECTED NOT DETECTED Final   Listeria monocytogenes NOT DETECTED NOT DETECTED Final   Staphylococcus species  DETECTED (A) NOT DETECTED Final    Comment: CRITICAL RESULT CALLED TO, READ BACK BY AND VERIFIED WITH: PHARMD UTOMWEN, A 1327 [id number] FCP    Staphylococcus aureus (BCID) DETECTED (A) NOT DETECTED Final    Comment: CRITICAL RESULT CALLED TO, READ BACK BY AND VERIFIED WITH: PHARMD UTOMWEN, A 1327 [id number] FCP    Staphylococcus epidermidis NOT DETECTED NOT DETECTED Final   Staphylococcus lugdunensis NOT DETECTED NOT DETECTED Final   Streptococcus species NOT DETECTED NOT DETECTED Final   Streptococcus agalactiae NOT DETECTED NOT DETECTED Final   Streptococcus pneumoniae NOT DETECTED NOT DETECTED Final   Streptococcus pyogenes NOT DETECTED NOT DETECTED Final   A.calcoaceticus-baumannii NOT DETECTED NOT DETECTED Final   Bacteroides fragilis NOT DETECTED NOT DETECTED Final   Enterobacterales NOT DETECTED NOT DETECTED Final   Enterobacter cloacae complex NOT DETECTED NOT DETECTED Final   Escherichia coli NOT DETECTED NOT DETECTED Final   Klebsiella aerogenes NOT DETECTED NOT DETECTED Final   Klebsiella oxytoca NOT DETECTED NOT DETECTED Final   Klebsiella pneumoniae NOT DETECTED NOT DETECTED Final   Proteus species NOT DETECTED NOT DETECTED Final   Salmonella species NOT DETECTED NOT DETECTED Final   Serratia marcescens NOT DETECTED NOT DETECTED Final   Haemophilus influenzae NOT DETECTED NOT DETECTED Final   Neisseria meningitidis NOT DETECTED NOT DETECTED Final   Pseudomonas aeruginosa NOT DETECTED NOT DETECTED Final   Stenotrophomonas maltophilia NOT DETECTED NOT DETECTED Final   Candida albicans NOT DETECTED NOT DETECTED Final   Candida auris NOT DETECTED NOT DETECTED Final   Candida glabrata NOT DETECTED NOT DETECTED Final   Candida krusei NOT DETECTED NOT DETECTED Final   Candida parapsilosis NOT DETECTED NOT DETECTED Final   Candida tropicalis NOT DETECTED NOT DETECTED Final   Cryptococcus neoformans/gattii NOT DETECTED NOT DETECTED Final   Meth resistant mecA/C and MREJ NOT DETECTED  NOT DETECTED Final    Comment: Performed at [facility], [address] [place], [place] [address]  MRSA Next Gen by PCR, Nasal     Status: None   Collection Time: 10/29/24 11:12 PM   Specimen: Nasal Mucosa; Nasal Swab  Result Value Ref Range Status   MRSA by PCR Next Gen NOT DETECTED NOT DETECTED Final    Comment: (NOTE) The GeneXpert MRSA  Assay (FDA approved for NASAL specimens only), is one component of a comprehensive MRSA colonization surveillance program. It is not intended to diagnose MRSA infection nor to guide or monitor treatment for MRSA infections. Test performance is not FDA approved in patients less than 60 years old. Performed at [facility], [address], [place], [place] [address]      Serology:    Imaging: If present, new imagings (plain films, ct scans, and mri) have been personally visualized and interpreted; radiology reports have been reviewed. Decision making incorporated into the Impression / Recommendations.  12/17 ct chest without contrast 1. No acute intrathoracic pathology. 2. Moderate size hiatal hernia. 3.  Aortic Atherosclerosis   Constance ONEIDA Passer, MD [facility] [phone] pager    10/30/2024, 3:34 PM     [1]  Social History Tobacco Use   Smoking status: Former   Smokeless tobacco: Never  Vaping Use   Vaping status: Never Used  Substance Use Topics   Alcohol  use: No   Drug use: No  [2]  Allergies Allergen Reactions   Ace Inhibitors Other (See Comments)    Severe AKI from dehydration and ARB   Angiotensin Receptor Blockers Other (See Comments)    Severe AKI from dehydration and ARB   Tetanus Toxoid, Adsorbed Swelling and Other (See Comments)    Arm became swollen   Watermelon [Citrullus Vulgaris] Swelling and Other (See Comments)    Tongue became swollen   Iodine Rash   Sulfa Antibiotics Rash

## 2024-10-30 NOTE — Progress Notes (Signed)
 PROGRESS NOTE    Jody Shaw  FMW:[id number] DOB: November 28, 1940 DOA: 10/29/2024 PCP: Nichole Senior, MD   Brief Narrative:  83 y.o. female with medical history significant for giant cell arteritis, hypothyroidism, CAD, GERD, lymphedema, HTN, HLD and anxiety was sent by her PCP for evaluation and management of pneumonia and elevated creatinine.  On admission, patient was hypothermic with creatinine of 2.76, bicarb of 15, WBC of 13.3, lactic acid of 1.6 and 1.2 with negative flu/RSV/COVID.  Chest x-ray showed no active disease.  CT of the chest showed no acute intrathoracic pathology but showed a moderate-sized hiatal hernia.  She was started on IV fluids and antibiotics.  Assessment & Plan:   Severe sepsis: Present on admission Concern for community-acquired bacterial pneumonia: Bacterial unspecified UTI: Present on admission -Presented with generalized weakness and cough and chest x-ray at PCPs office showed pneumonia however repeat CXR and CT chest on admission with no acute abnormalities.  -Had hypotension, hypothermia, leukocytosis and possible pneumonia/UTI on admission - Follow cultures.  Respiratory panel PCR negative.  Continue Rocephin  and Zithromax .  Blood pressures intermittently on the lower side.  Continue IV fluids.  Use midodrine  if needed  Lactic acidosis - Lactic acid worsened since admission but improving this morning to 2.9.  AKI on CKD stage IIIb - Baseline creatinine of 1.6-1.7.  Creatinine 2.76 on presentation.  Improving to 2.36 this morning.  Continue IV fluids.  Acute on chronic anemia of chronic disease - Baseline of 7.5-9.  Hemoglobin 6.4 this morning.  No signs of bleeding.  Transfuse 1 unit packed red cells.  Monitor H&H.  DC Lovenox .  Hypothermia - Improved.  Required Bair hugger on admission  Hypertension - Antihypertensives on hold  Hyperlipidemia - Continue statin  Hypothyroidism Continue levothyroxine   Anxiety and depression Continue  sertraline   Chronic lymphedema - Outpatient follow-up.  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  Continue prednisone .  Generalized weakness -PT eval  Grief -Reports difficulty adjusting after losing her husband of 66 years last year - Chaplain consulted for spiritual/emotional support  Goals of care - Tried to have goals of care discussion with the patient: Wants to remain full code for now.  Consult palliative care for goals of care discussion.    DVT prophylaxis: DC Lovenox .  Cannot use SCDs because of chronic lipedema Code Status: Full Family Communication: Daughter at bedside Disposition Plan: Status is: Inpatient Remains inpatient appropriate because: Of severity of illness    Consultants: Palliative care  Procedures: None  Antimicrobials: Rocephin  and Zithromax  from 10/29/2024 onwards   Subjective: Patient seen and examined at bedside.  Had abdominal pain earlier this morning with some nausea and vomiting but has improved since.  No fever, seizures or agitation reported.  Objective: Vitals:   10/30/24 0825 10/30/24 0915 10/30/24 0955 10/30/24 1034  BP: 97/62 (!) 93/58 (!) 120/56 (!) 110/56  Pulse: 67 71 66 71  Resp: 19 14 (!) 21 13  Temp:   (!) 97.4 F (36.3 C)   TempSrc:   Oral   SpO2: 98% 100% 100% 95%    Intake/Output Summary (Last 24 hours) at 10/30/2024 1126 Last data filed at 10/30/2024 9487 Gross per 24 hour  Intake 500.15 ml  Output --  Net 500.15 ml   There were no vitals filed for this visit.  Examination:  General exam: Appears calm and comfortable.  Elderly female lying in bed.  Chronically ill and  deconditioned looking  respiratory system: Bilateral decreased breath sounds at bases with scattered crackles Cardiovascular system: S1 & S2 heard, Rate controlled Gastrointestinal system: Abdomen is nondistended, soft and nontender. Normal bowel sounds heard. Extremities: No  cyanosis, clubbing; bilateral lower extremities are wrapped Central nervous system: Alert and oriented.  Slow to respond.  Poor historian.  No focal neurological deficits. Moving extremities Skin: No ecchymosis/lesions  psychiatry: Affect is mostly flat.  Not agitated.   Data Reviewed: I have personally reviewed following labs and imaging studies  CBC: Recent Labs  Lab 10/29/24 1441 10/30/24 0709  WBC 13.3* 12.2*  NEUTROABS 11.1*  --   HGB 8.9* 6.4*  HCT 29.8* 20.7*  MCV 86.4 85.2  PLT 264 213   Basic Metabolic Panel: Recent Labs  Lab 10/29/24 1441 10/30/24 0546  NA 139 140  K 5.1 4.6  CL 107 111  CO2 15* 15*  GLUCOSE 113* 80  BUN 61* 49*  CREATININE 2.76* 2.36*  CALCIUM  8.5* 7.3*   GFR: CrCl cannot be calculated (Unknown ideal weight.). Liver Function Tests: Recent Labs  Lab 10/29/24 1441  AST 20  ALT 24  ALKPHOS 124  BILITOT 0.2  PROT 7.0  ALBUMIN 3.2*   No results for input(s): LIPASE, AMYLASE in the last 168 hours. No results for input(s): AMMONIA in the last 168 hours. Coagulation Profile: Recent Labs  Lab 10/29/24 1441  INR 1.2   Cardiac Enzymes: No results for input(s): CKTOTAL, CKMB, CKMBINDEX, TROPONINI in the last 168 hours. BNP (last 3 results) No results for input(s): PROBNP in the last 8760 hours. HbA1C: No results for input(s): HGBA1C in the last 72 hours. CBG: No results for input(s): GLUCAP in the last 168 hours. Lipid Profile: No results for input(s): CHOL, HDL, LDLCALC, TRIG, CHOLHDL, LDLDIRECT in the last 72 hours. Thyroid Function Tests: No results for input(s): TSH, T4TOTAL, FREET4, T3FREE, THYROIDAB in the last 72 hours. Anemia Panel: No results for input(s): VITAMINB12, FOLATE, FERRITIN, TIBC, IRON, RETICCTPCT in the last 72 hours. Sepsis Labs: Recent Labs  Lab 10/29/24 1512 10/29/24 1710 10/30/24 0302 10/30/24 0546  PROCALCITON  --   --   --  0.28  LATICACIDVEN  1.6 1.2 3.5* 2.9*    Recent Results (from the past 240 hours)  Resp panel by RT-PCR (RSV, Flu A&B, Covid)     Status: None   Collection Time: 10/29/24  2:31 PM   Specimen: Nasal Swab  Result Value Ref Range Status   SARS Coronavirus 2 by RT PCR NEGATIVE NEGATIVE Final    Comment: (NOTE) SARS-CoV-2 target nucleic acids are NOT DETECTED.  The SARS-CoV-2 RNA is generally detectable in upper respiratory specimens during the acute phase of infection. The lowest concentration of SARS-CoV-2 viral copies this assay can detect is 138 copies/mL. A negative result does not preclude SARS-Cov-2 infection and should not be used as the sole basis for treatment or other patient management decisions. A negative result may occur with  improper specimen collection/handling, submission of specimen other than nasopharyngeal swab, presence of viral mutation(s) within the areas targeted by this assay, and inadequate number of viral copies(<138 copies/mL). A negative result must be combined with clinical observations, patient history, and epidemiological information. The expected result is Negative.  Fact Sheet for Patients:  bloggercourse.com  Fact Sheet for Healthcare Providers:  seriousbroker.it  This test is no t yet approved or cleared by the [place]  FDA and  has been authorized for detection and/or diagnosis of SARS-CoV-2 by FDA under an Emergency  Use Authorization (EUA). This EUA will remain  in effect (meaning this test can be used) for the duration of the COVID-19 declaration under Section 564(b)(1) of the Act, 21 U.S.C.section 360bbb-3(b)(1), unless the authorization is terminated  or revoked sooner.       Influenza A by PCR NEGATIVE NEGATIVE Final   Influenza B by PCR NEGATIVE NEGATIVE Final    Comment: (NOTE) The Xpert Xpress SARS-CoV-2/FLU/RSV plus assay is intended as an aid in the diagnosis of influenza from Nasopharyngeal  swab specimens and should not be used as a sole basis for treatment. Nasal washings and aspirates are unacceptable for Xpert Xpress SARS-CoV-2/FLU/RSV testing.  Fact Sheet for Patients: bloggercourse.com  Fact Sheet for Healthcare Providers: seriousbroker.it  This test is not yet approved or cleared by the [place]  FDA and has been authorized for detection and/or diagnosis of SARS-CoV-2 by FDA under an Emergency Use Authorization (EUA). This EUA will remain in effect (meaning this test can be used) for the duration of the COVID-19 declaration under Section 564(b)(1) of the Act, 21 U.S.C. section 360bbb-3(b)(1), unless the authorization is terminated or revoked.     Resp Syncytial Virus by PCR NEGATIVE NEGATIVE Final    Comment: (NOTE) Fact Sheet for Patients: bloggercourse.com  Fact Sheet for Healthcare Providers: seriousbroker.it  This test is not yet approved or cleared by the [place]  FDA and has been authorized for detection and/or diagnosis of SARS-CoV-2 by FDA under an Emergency Use Authorization (EUA). This EUA will remain in effect (meaning this test can be used) for the duration of the COVID-19 declaration under Section 564(b)(1) of the Act, 21 U.S.C. section 360bbb-3(b)(1), unless the authorization is terminated or revoked.  Performed at [facility], [address] [place], [place] [address]   Respiratory (~20 pathogens) panel by PCR     Status: None   Collection Time: 10/29/24  2:31 PM   Specimen: Nasopharyngeal Swab; Respiratory  Result Value Ref Range Status   Adenovirus NOT DETECTED NOT DETECTED Final   Coronavirus 229E NOT DETECTED NOT DETECTED Final    Comment: (NOTE) The Coronavirus on the Respiratory Panel, DOES NOT test for the novel  Coronavirus (2019 nCoV)    Coronavirus HKU1 NOT DETECTED NOT DETECTED Final    Coronavirus NL63 NOT DETECTED NOT DETECTED Final   Coronavirus OC43 NOT DETECTED NOT DETECTED Final   Metapneumovirus NOT DETECTED NOT DETECTED Final   Rhinovirus / Enterovirus NOT DETECTED NOT DETECTED Final   Influenza A NOT DETECTED NOT DETECTED Final   Influenza B NOT DETECTED NOT DETECTED Final   Parainfluenza Virus 1 NOT DETECTED NOT DETECTED Final   Parainfluenza Virus 2 NOT DETECTED NOT DETECTED Final   Parainfluenza Virus 3 NOT DETECTED NOT DETECTED Final   Parainfluenza Virus 4 NOT DETECTED NOT DETECTED Final   Respiratory Syncytial Virus NOT DETECTED NOT DETECTED Final   Bordetella pertussis NOT DETECTED NOT DETECTED Final   Bordetella Parapertussis NOT DETECTED NOT DETECTED Final   Chlamydophila pneumoniae NOT DETECTED NOT DETECTED Final   Mycoplasma pneumoniae NOT DETECTED NOT DETECTED Final    Comment: Performed at [facility], [address] [place], [place] [address]  Blood Culture (routine x 2)     Status: None (Preliminary result)   Collection Time: 10/29/24  3:25 PM   Specimen: BLOOD  Result Value Ref Range Status   Specimen Description   Final    BLOOD SITE NOT SPECIFIED Performed at [facility], [address] [place],  [place] [address]    Special Requests   Final    BOTTLES DRAWN AEROBIC AND ANAEROBIC Blood Culture results may not be optimal due to an inadequate volume of blood received in culture bottles Performed at [facility], [address] [place], [place] [address]    Culture   Final    NO GROWTH < 12 HOURS Performed at [facility], [address], [place], [place] [address]    Report Status PENDING  Incomplete  Blood Culture (routine x 2)     Status: None (Preliminary result)   Collection Time: 10/29/24  3:40 PM   Specimen: BLOOD  Result Value Ref Range Status   Specimen Description   Final    BLOOD SITE NOT SPECIFIED Performed at [facility], [address]  [place], [place] [address]    Special Requests   Final    BOTTLES DRAWN AEROBIC AND ANAEROBIC Blood Culture results may not be optimal due to an inadequate volume of blood received in culture bottles Performed at [facility], [address], [place], [place] [address]    Culture   Final    NO GROWTH < 12 HOURS Performed at [facility], [address], [place], [place] [address]    Report Status PENDING  Incomplete  MRSA Next Gen by PCR, Nasal     Status: None   Collection Time: 10/29/24 11:12 PM   Specimen: Nasal Mucosa; Nasal Swab  Result Value Ref Range Status   MRSA by PCR Next Gen NOT DETECTED NOT DETECTED Final    Comment: (NOTE) The GeneXpert MRSA Assay (FDA approved for NASAL specimens only), is one component of a comprehensive MRSA colonization surveillance program. It is not intended to diagnose MRSA infection nor to guide or monitor treatment for MRSA infections. Test performance is not FDA approved in patients less than 88 years old. Performed at [facility], [address], [place], [place] [address]          Radiology Studies: CT Chest Wo Contrast Result Date: 10/29/2024 CLINICAL DATA:  Nontraumatic chest wall pain. Concern for inflammatory/infectious etiology. EXAM: CT CHEST WITHOUT CONTRAST TECHNIQUE: Multidetector CT imaging of the chest was performed following the standard protocol without IV contrast. RADIATION DOSE REDUCTION: This exam was performed according to the departmental dose-optimization program which includes automated exposure control, adjustment of the mA and/or kV according to patient size and/or use of iterative reconstruction technique. COMPARISON:  Chest radiograph dated 10/29/2024. FINDINGS: Evaluation of this exam is limited in the absence of intravenous contrast. Cardiovascular: There is no cardiomegaly or pericardial effusion. There is coronary vascular calcification and calcification of the mitral  annulus. Moderate atherosclerotic calcification of the thoracic aorta. No aneurysmal dilatation. The central pulmonary arteries are grossly unremarkable. Mediastinum/Nodes: No hilar or mediastinal adenopathy. Moderate size hiatal hernia. The esophagus is grossly unremarkable no mediastinal fluid collection. Lungs/Pleura: Bibasilar subpleural atelectasis. Biapical subpleural linear scarring. No consolidative changes. There is no pleural effusion pneumothorax. The central airways are patent. Upper Abdomen: No acute abnormality. Musculoskeletal: Osteopenia with degenerative changes of the spine. No acute osseous pathology. No fluid collection. IMPRESSION: 1. No acute intrathoracic pathology. 2. Moderate size hiatal hernia. 3.  Aortic Atherosclerosis (ICD10-I70.0). Electronically Signed   By: Vanetta Chou M.D.   On: 10/29/2024 17:01   DG Chest Port 1 View Result Date: 10/29/2024 EXAM: 1 VIEW(S) XRAY OF THE CHEST 10/29/2024 02:46:00 PM COMPARISON: Comparison with 03/24/2024. CLINICAL HISTORY: Questionable sepsis -  evaluate for abnormality. FINDINGS: LUNGS AND PLEURA: Shallow inspiration with elevation of the right hemidiaphragm. Mild linear atelectasis or scarring in the lung bases. No airspace disease or consolidation. No pleural effusion or pneumothorax. HEART AND MEDIASTINUM: Moderate-sized hiatal hernia behind the heart. Heart size and pulmonary vascularity are normal. Mediastinal contours appear intact. BONES AND SOFT TISSUES: Degenerative changes in the spine and shoulders. No acute osseous abnormality. IMPRESSION: 1. No acute cardiopulmonary abnormality. Electronically signed by: Elsie Gravely MD 10/29/2024 03:17 PM EST RP Workstation: [id number]        Scheduled Meds:  sodium chloride    Intravenous Once   enoxaparin  (LOVENOX ) injection  30 mg Subcutaneous Q24H   levothyroxine   88 mcg Oral Q0600   potassium chloride   10 mEq Oral Daily   predniSONE   5 mg Oral Q breakfast   rosuvastatin   10 mg  Oral Once per day on Sunday Wednesday   sertraline   25 mg Oral Daily   Continuous Infusions:        Sophie Mao, MD [facility] 10/30/2024, 11:26 AM

## 2024-10-30 NOTE — Progress Notes (Signed)
 PT Cancellation Note  Patient Details Name: Jody Shaw MRN: [id number] DOB: 09-29-1941   Cancelled Treatment:    Reason Eval/Treat Not Completed: Fatigue/lethargy limiting ability to participate;Pain limiting ability to participate (pt reports she's too tired to attempt PT at present. She also reports abdominal pain. Pt's daughter stated RN is aware of abdominal pain. Will attempt tomorrow.)   [name] PT 10/30/2024  Acute Rehabilitation Services  Office [phone]

## 2024-10-30 NOTE — Progress Notes (Signed)
 PHARMACY - PHYSICIAN COMMUNICATION CRITICAL VALUE ALERT - BLOOD CULTURE IDENTIFICATION (BCID)  Jody Shaw is an 83 y.o. female who presented to [facility] on 10/29/2024 with a chief complaint of weakness and cough  Assessment: GPC in clusters in two sets of blood cultures.  BCID result showing MSSA  Name of physician (or Provider) Contacted: Dr. Cheryle and Dr. Overton with ID  Current antibiotics: Azithromycin  + ceftriaxone   Changes to prescribed antibiotics recommended: change to cefazolin  2g IV q12h (adjusted for renal impairment Recommendations accepted by provider  Results for orders placed or performed during the hospital encounter of 10/29/24  Blood Culture ID Panel (Reflexed) (Collected: 10/29/2024  3:40 PM)  Result Value Ref Range   Enterococcus faecalis NOT DETECTED NOT DETECTED   Enterococcus Faecium NOT DETECTED NOT DETECTED   Listeria monocytogenes NOT DETECTED NOT DETECTED   Staphylococcus species DETECTED (A) NOT DETECTED   Staphylococcus aureus (BCID) DETECTED (A) NOT DETECTED   Staphylococcus epidermidis NOT DETECTED NOT DETECTED   Staphylococcus lugdunensis NOT DETECTED NOT DETECTED   Streptococcus species NOT DETECTED NOT DETECTED   Streptococcus agalactiae NOT DETECTED NOT DETECTED   Streptococcus pneumoniae NOT DETECTED NOT DETECTED   Streptococcus pyogenes NOT DETECTED NOT DETECTED   A.calcoaceticus-baumannii NOT DETECTED NOT DETECTED   Bacteroides fragilis NOT DETECTED NOT DETECTED   Enterobacterales NOT DETECTED NOT DETECTED   Enterobacter cloacae complex NOT DETECTED NOT DETECTED   Escherichia coli NOT DETECTED NOT DETECTED   Klebsiella aerogenes NOT DETECTED NOT DETECTED   Klebsiella oxytoca NOT DETECTED NOT DETECTED   Klebsiella pneumoniae NOT DETECTED NOT DETECTED   Proteus species NOT DETECTED NOT DETECTED   Salmonella species NOT DETECTED NOT DETECTED   Serratia marcescens NOT DETECTED NOT DETECTED   Haemophilus influenzae NOT DETECTED NOT DETECTED    Neisseria meningitidis NOT DETECTED NOT DETECTED   Pseudomonas aeruginosa NOT DETECTED NOT DETECTED   Stenotrophomonas maltophilia NOT DETECTED NOT DETECTED   Candida albicans NOT DETECTED NOT DETECTED   Candida auris NOT DETECTED NOT DETECTED   Candida glabrata NOT DETECTED NOT DETECTED   Candida krusei NOT DETECTED NOT DETECTED   Candida parapsilosis NOT DETECTED NOT DETECTED   Candida tropicalis NOT DETECTED NOT DETECTED   Cryptococcus neoformans/gattii NOT DETECTED NOT DETECTED   Meth resistant mecA/C and MREJ NOT DETECTED NOT DETECTED    Jody Shaw 10/30/2024  1:48 PM

## 2024-10-30 NOTE — Progress Notes (Signed)
 I met with Jody Shaw and her daughter, Jody Shaw, to provide support.  She has some questions about death and the afterlife that have come up as she continues to grieve her husband of almost 66 years who died last year.  I engaged her in conversation about her own beliefs and about her family.  I also provided prayer, at her request.   After my visit with [name], I spoke with her daughter who has been her main caregiver as her health declines.  She has some questions about the safety of her mom's living situation.  I provided reflective listening as she shared about her concerns and also encouraged her to speak with the medical and transitions of care team about these questions.  [address], Bcc Pager, [phone]

## 2024-10-30 NOTE — Consult Note (Addendum)
 WOC Nurse Consult Note: Reason for Consult: Consult requested for bilat legs and buttocks wounds.  Pt has generalized edema and erythemia to bilat legs, small amt yellow drainage, patchy areas of partial thickness skin loss to right anterior calf and bilat posterior calves. Scabbed loose cracked skin in patchy areas to feet and legs. Left plantar posterior foot with dark red-purple Deep tissue pressure injury; 3X4cm, no open wound or drainage at this time.  Pressure Injury POA: Yes    Inner gluteal fold/left buttock with a red moist Stage 3 pressure injury, 3X3X.2cm. Buttocks with red, moist macerated skin; appearance is consistent with moisture associated skin damage.  Present on admission: Yes . Daughter at the bedside to discuss plan of care.   Topical treatment orders provided for bedside nurses to perform as follows:  1. Change dressings to bilat legs Q Thurs/Sat/Mon as follows: apply Xeroform gauze to left plantar foot and bilat anterior/posterior legs, then cover with ABD pads and kerlex and ace wrap, beginning just behind toes to below knees.  2. Foam dressing to inner buttock wound, change Q 3 days or PRN soiling  Please re-consult if further assistance is needed.  Thank-you,  Stephane Fought MSN, RN, CWOCN, CWCN-AP, CNS Contact Mon-Fri 0700-1500: [phone]

## 2024-10-30 NOTE — Progress Notes (Signed)
 PT Cancellation Note  Patient Details Name: Jody Shaw MRN: [id number] DOB: Apr 21, 1941   Cancelled Treatment:    Reason Eval/Treat Not Completed: Medical issues which prohibited therapy (Hgb 6.4, will await completion of transfusion. Will follow.)   [name] PT 10/30/2024  Acute Rehabilitation Services  Office [phone]

## 2024-10-30 NOTE — ED Notes (Signed)
 Patient has blood ready, informed Bri, EMT-P, verbally spoke with her at approximately, 9: 30 am, as soon as the blood bank called.

## 2024-10-31 ENCOUNTER — Inpatient Hospital Stay (HOSPITAL_COMMUNITY)

## 2024-10-31 DIAGNOSIS — R7881 Bacteremia: Secondary | ICD-10-CM | POA: Diagnosis not present

## 2024-10-31 DIAGNOSIS — A419 Sepsis, unspecified organism: Secondary | ICD-10-CM | POA: Diagnosis not present

## 2024-10-31 DIAGNOSIS — E872 Acidosis, unspecified: Secondary | ICD-10-CM

## 2024-10-31 DIAGNOSIS — J189 Pneumonia, unspecified organism: Secondary | ICD-10-CM | POA: Diagnosis not present

## 2024-10-31 DIAGNOSIS — Z515 Encounter for palliative care: Secondary | ICD-10-CM

## 2024-10-31 DIAGNOSIS — D631 Anemia in chronic kidney disease: Secondary | ICD-10-CM

## 2024-10-31 DIAGNOSIS — Z7189 Other specified counseling: Secondary | ICD-10-CM

## 2024-10-31 DIAGNOSIS — I890 Lymphedema, not elsewhere classified: Secondary | ICD-10-CM | POA: Diagnosis not present

## 2024-10-31 DIAGNOSIS — I129 Hypertensive chronic kidney disease with stage 1 through stage 4 chronic kidney disease, or unspecified chronic kidney disease: Secondary | ICD-10-CM

## 2024-10-31 DIAGNOSIS — R6520 Severe sepsis without septic shock: Secondary | ICD-10-CM | POA: Diagnosis not present

## 2024-10-31 DIAGNOSIS — Z87891 Personal history of nicotine dependence: Secondary | ICD-10-CM

## 2024-10-31 DIAGNOSIS — F418 Other specified anxiety disorders: Secondary | ICD-10-CM

## 2024-10-31 DIAGNOSIS — N1832 Chronic kidney disease, stage 3b: Secondary | ICD-10-CM | POA: Diagnosis not present

## 2024-10-31 DIAGNOSIS — A4101 Sepsis due to Methicillin susceptible Staphylococcus aureus: Secondary | ICD-10-CM | POA: Diagnosis not present

## 2024-10-31 DIAGNOSIS — N179 Acute kidney failure, unspecified: Secondary | ICD-10-CM | POA: Diagnosis not present

## 2024-10-31 DIAGNOSIS — L8990 Pressure ulcer of unspecified site, unspecified stage: Secondary | ICD-10-CM | POA: Insufficient documentation

## 2024-10-31 DIAGNOSIS — T68XXXA Hypothermia, initial encounter: Secondary | ICD-10-CM | POA: Diagnosis not present

## 2024-10-31 DIAGNOSIS — N390 Urinary tract infection, site not specified: Secondary | ICD-10-CM

## 2024-10-31 LAB — COMPREHENSIVE METABOLIC PANEL WITH GFR
ALT: 15 U/L (ref 0–44)
AST: 23 U/L (ref 15–41)
Albumin: 2.4 g/dL — ABNORMAL LOW (ref 3.5–5.0)
Alkaline Phosphatase: 99 U/L (ref 38–126)
Anion gap: 15 (ref 5–15)
BUN: 48 mg/dL — ABNORMAL HIGH (ref 8–23)
CO2: 14 mmol/L — ABNORMAL LOW (ref 22–32)
Calcium: 7.5 mg/dL — ABNORMAL LOW (ref 8.9–10.3)
Chloride: 113 mmol/L — ABNORMAL HIGH (ref 98–111)
Creatinine, Ser: 2.43 mg/dL — ABNORMAL HIGH (ref 0.44–1.00)
GFR, Estimated: 19 mL/min — ABNORMAL LOW
Glucose, Bld: 77 mg/dL (ref 70–99)
Potassium: 4.9 mmol/L (ref 3.5–5.1)
Sodium: 142 mmol/L (ref 135–145)
Total Bilirubin: 0.2 mg/dL (ref 0.0–1.2)
Total Protein: 5.2 g/dL — ABNORMAL LOW (ref 6.5–8.1)

## 2024-10-31 LAB — GASTROINTESTINAL PANEL BY PCR, STOOL (REPLACES STOOL CULTURE)

## 2024-10-31 LAB — CBC WITH DIFFERENTIAL/PLATELET
Abs Immature Granulocytes: 0.07 K/uL (ref 0.00–0.07)
Basophils Absolute: 0 K/uL (ref 0.0–0.1)
Basophils Relative: 0 %
Eosinophils Absolute: 0 K/uL (ref 0.0–0.5)
Eosinophils Relative: 0 %
HCT: 30.3 % — ABNORMAL LOW (ref 36.0–46.0)
Hemoglobin: 9.4 g/dL — ABNORMAL LOW (ref 12.0–15.0)
Immature Granulocytes: 1 %
Lymphocytes Relative: 1 %
Lymphs Abs: 0.1 K/uL — ABNORMAL LOW (ref 0.7–4.0)
MCH: 26.8 pg (ref 26.0–34.0)
MCHC: 31 g/dL (ref 30.0–36.0)
MCV: 86.3 fL (ref 80.0–100.0)
Monocytes Absolute: 0.8 K/uL (ref 0.1–1.0)
Monocytes Relative: 8 %
Neutro Abs: 9.8 K/uL — ABNORMAL HIGH (ref 1.7–7.7)
Neutrophils Relative %: 90 %
Platelets: 201 K/uL (ref 150–400)
RBC: 3.51 MIL/uL — ABNORMAL LOW (ref 3.87–5.11)
RDW: 19.7 % — ABNORMAL HIGH (ref 11.5–15.5)
WBC: 10.8 K/uL — ABNORMAL HIGH (ref 4.0–10.5)
nRBC: 0.5 % — ABNORMAL HIGH (ref 0.0–0.2)

## 2024-10-31 LAB — BLOOD GAS, VENOUS
Acid-base deficit: 9.6 mmol/L — ABNORMAL HIGH (ref 0.0–2.0)
Bicarbonate: 16.5 mmol/L — ABNORMAL LOW (ref 20.0–28.0)
O2 Saturation: 86.4 %
Patient temperature: 35.6
pCO2, Ven: 34 mmHg — ABNORMAL LOW (ref 44–60)
pH, Ven: 7.29 (ref 7.25–7.43)
pO2, Ven: 49 mmHg — ABNORMAL HIGH (ref 32–45)

## 2024-10-31 LAB — ECHOCARDIOGRAM COMPLETE
AR max vel: 1.95 cm2
AV Area VTI: 1.83 cm2
AV Area mean vel: 1.72 cm2
AV Mean grad: 5 mmHg
AV Peak grad: 8.9 mmHg
Ao pk vel: 1.49 m/s
Area-P 1/2: 2.97 cm2
MV VTI: 1.86 cm2
S' Lateral: 2.3 cm

## 2024-10-31 LAB — GLUCOSE, CAPILLARY
Glucose-Capillary: 121 mg/dL — ABNORMAL HIGH (ref 70–99)
Glucose-Capillary: 159 mg/dL — ABNORMAL HIGH (ref 70–99)
Glucose-Capillary: 133 mg/dL — ABNORMAL HIGH (ref 70–99)

## 2024-10-31 LAB — URINE CULTURE: Culture: <10000 — AB

## 2024-10-31 LAB — TYPE AND SCREEN
ABO/RH(D): A POS
Antibody Screen: NEGATIVE
Unit division: 0

## 2024-10-31 LAB — TSH: TSH: 0.896 u[IU]/mL (ref 0.350–4.500)

## 2024-10-31 LAB — BPAM RBC
Blood Product Expiration Date: 202601122359
ISSUE DATE / TIME: 202512181006
Unit Type and Rh: 6200

## 2024-10-31 LAB — LACTIC ACID, PLASMA: Lactic Acid, Venous: 2 mmol/L (ref 0.5–1.9)

## 2024-10-31 LAB — MAGNESIUM: Magnesium: 1.8 mg/dL (ref 1.7–2.4)

## 2024-10-31 MED ORDER — PANTOPRAZOLE SODIUM 40 MG IV SOLR
40.0000 mg | INTRAVENOUS | Status: DC
  Administered 2024-10-31 – 2024-11-02 (×3): 40 mg via INTRAVENOUS
  Filled 2024-10-31 (×3): qty 10

## 2024-10-31 MED ORDER — NUTRISOURCE FIBER PO PACK
1.0000 | PACK | Freq: Two times a day (BID) | ORAL | Status: DC
  Filled 2024-10-31: qty 1

## 2024-10-31 MED ORDER — SODIUM BICARBONATE 8.4 % IV SOLN
INTRAVENOUS | Status: DC
  Filled 2024-10-31: qty 150
  Filled 2024-10-31: qty 1000

## 2024-10-31 MED ORDER — LOPERAMIDE HCL 2 MG PO CAPS: 2.0000 mg | ORAL_CAPSULE | Freq: Four times a day (QID) | ORAL | Status: DC | PRN

## 2024-10-31 MED ORDER — SODIUM CHLORIDE 0.9 % IV BOLUS
1000.0000 mL | Freq: Once | INTRAVENOUS | Status: AC
  Administered 2024-10-31: 1000 mL via INTRAVENOUS

## 2024-10-31 MED ORDER — UMECLIDINIUM-VILANTEROL 62.5-25 MCG/ACT IN AEPB
1.0000 | INHALATION_SPRAY | Freq: Every day | RESPIRATORY_TRACT | Status: DC
  Administered 2024-10-31 – 2024-11-07 (×7): 1 via RESPIRATORY_TRACT
  Filled 2024-10-31: qty 14

## 2024-10-31 MED ORDER — SODIUM BICARBONATE 8.4 % IV SOLN
50.0000 meq | Freq: Once | INTRAVENOUS | Status: AC
  Administered 2024-10-31: 50 meq via INTRAVENOUS
  Filled 2024-10-31: qty 50

## 2024-10-31 MED ORDER — ASPIRIN 81 MG PO TBEC
81.0000 mg | DELAYED_RELEASE_TABLET | Freq: Every day | ORAL | Status: DC
  Administered 2024-10-31 – 2024-11-06 (×7): 81 mg via ORAL
  Filled 2024-10-31 (×7): qty 1

## 2024-10-31 MED ORDER — NUTRISOURCE FIBER PO PACK
1.0000 | PACK | Freq: Two times a day (BID) | ORAL | Status: DC
  Administered 2024-10-31 – 2024-11-08 (×15): 1 via ORAL
  Filled 2024-10-31 (×16): qty 1

## 2024-10-31 MED ORDER — MIDODRINE HCL 5 MG PO TABS
10.0000 mg | ORAL_TABLET | Freq: Once | ORAL | Status: AC
  Administered 2024-10-31: 10 mg via ORAL
  Filled 2024-10-31: qty 2

## 2024-10-31 MED ORDER — CLOTRIMAZOLE 1 % EX CREA
TOPICAL_CREAM | Freq: Two times a day (BID) | CUTANEOUS | Status: DC
  Administered 2024-11-01 – 2024-11-03 (×3): 1 via TOPICAL
  Filled 2024-10-31 (×2): qty 15

## 2024-10-31 MED ORDER — ALBUMIN HUMAN 25 % IV SOLN
50.0000 g | Freq: Once | INTRAVENOUS | Status: AC
  Administered 2024-10-31: 50 g via INTRAVENOUS
  Filled 2024-10-31: qty 200

## 2024-10-31 MED ORDER — HEPARIN SODIUM (PORCINE) 5000 UNIT/ML IJ SOLN
5000.0000 [IU] | Freq: Three times a day (TID) | INTRAMUSCULAR | Status: DC
  Administered 2024-10-31 – 2024-11-07 (×19): 5000 [IU] via SUBCUTANEOUS
  Filled 2024-10-31 (×20): qty 1

## 2024-10-31 MED ORDER — IPRATROPIUM-ALBUTEROL 0.5-2.5 (3) MG/3ML IN SOLN
3.0000 mL | RESPIRATORY_TRACT | Status: DC | PRN
  Administered 2024-11-02 – 2024-11-06 (×5): 3 mL via RESPIRATORY_TRACT
  Filled 2024-10-31 (×5): qty 3

## 2024-10-31 MED ORDER — MAGNESIUM SULFATE 2 GM/50ML IV SOLN
2.0000 g | Freq: Once | INTRAVENOUS | Status: AC
  Administered 2024-10-31: 2 g via INTRAVENOUS
  Filled 2024-10-31: qty 50

## 2024-10-31 MED ORDER — NOREPINEPHRINE 4 MG/250ML-% IV SOLN
0.0000 ug/min | INTRAVENOUS | Status: DC
  Administered 2024-10-31: 2 ug/min via INTRAVENOUS
  Administered 2024-11-01: 6 ug/min via INTRAVENOUS
  Filled 2024-10-31 (×2): qty 250

## 2024-10-31 MED ORDER — SODIUM CHLORIDE 0.9 % IV SOLN
250.0000 mL | INTRAVENOUS | Status: AC
  Administered 2024-10-31: 250 mL via INTRAVENOUS

## 2024-10-31 MED ORDER — LACTATED RINGERS IV BOLUS
500.0000 mL | Freq: Once | INTRAVENOUS | Status: AC
  Administered 2024-10-31: 500 mL via INTRAVENOUS

## 2024-10-31 MED ORDER — HYDROCORTISONE SOD SUC (PF) 100 MG IJ SOLR
100.0000 mg | Freq: Four times a day (QID) | INTRAMUSCULAR | Status: DC
  Administered 2024-10-31 – 2024-11-01 (×4): 100 mg via INTRAVENOUS
  Filled 2024-10-31 (×4): qty 2

## 2024-10-31 NOTE — Progress Notes (Signed)
 " Daily Progress Note   Patient Name: Jody Shaw       Date: 10/31/2024 DOB: 03-Jun-1941  Age: 83 y.o. MRN#: [id number] Attending Physician: Cheryle Page, MD Primary Care Physician: Nichole Senior, MD Admit Date: 10/29/2024 Length of Stay: 2 days  Reason for Consultation/Follow-up: Establishing goals of care  Subjective:   Reviewed EMR including recent documentation from hospitalist, PCCM, infectious disease, and cardiology.  PCCM was consulted as patient continued to have hypotension despite IV fluid management.  Patient in ICU receiving pressor support for hypotension in setting of septic shock with noted MSSA bacteremia.  Infectious disease continuing antibiotic management.  Patient to receive stress dose steroids in setting of receiving chronic steroids for giant cell arteritis.  Also was discussed with patient and her daughter and planning to proceed with TEE for further infectious workup.  Reviewed recent CMP noting patient's creatinine continuing to increase to 2.43 and estimated GFR low at 19.  Reviewed CBC noting patient's leukocytosis has decreased to 10.8 from 13.3. Discussed care with RN for medical updates.  Presented to bedside to see patient.  Patient laying in bed with no visitors present at bedside.  Attempted to engage with patient though she was very lethargic.  Noted palliative medicine team will continue to follow along with patient's medical journey.  Discussed care with team including RN, hospitalist, PCCM, cardiology, and infectious disease to coordinate care.  At this time team helpful with antibiotics and stress dose steroids, patient may be able to come off of pressor support.  Hopeful TEE could provide further information about MSSA bacteremia.  Continuing to allow time for outcomes.  Objective:   Vital Signs:  BP (!) 90/41   Pulse (!) 31   Temp (!) 95.6 F (35.3 C) (Axillary) Comment: bear hugger started  Resp (!) 33   SpO2 (!) 86%   Physical  Exam: General: NAD, chronically ill-appearing Cardiovascular: RRR Respiratory: no increased work of breathing noted, not in respiratory distress Abdomen: not distended Neuro: Lethargic  Assessment & Plan:   Assessment: Patient is an 83 year old female with a past medical history of CAD, lymphedema, CKD stage IIIb, anemia, GERD, hypothyroidism, giant cell arteritis, hypothyroidism, HTN, and HLD who was sent to the ED by her PCP on 10/29/2024 for evaluation of possible pneumonia and elevated creatinine.  Chest x-ray at PCPs office showed pneumonia however repeat chest x-ray and CT chest on admission with no acute abnormalities. Patient is admitted with severe sepsis secondary to UTI, concern for pneumonia, lactic acidosis, and AKI on CKD. Palliative Medicine has been consulted for goals of care discussions and complex medical decision making.   Recommendations/Plan: # Complex medical decision making/goals of care:  - Patient was lethargic today during visit so unable to discuss care planning moving forward.  Allowing time for outcomes with aggressive interventions and workup.  Patient is DNR/DNI.  Palliative medicine team continuing to follow along with patient's medical journey.  -  Code Status: Limited: Do not attempt resuscitation (DNR) -DNR-LIMITED -Do Not Intubate/DNI   # Psychosocial Support:  - Jody Shaw   # Discharge Planning: To Be Determined  Discussed with: Attempted to engage with patient, hospitalist, PCCM, infectious disease, cardiology, RN  Thank you for allowing the palliative care team to participate in the care Jody Shaw.  Tinnie Radar, DO Palliative Care Provider PMT # [phone]  If patient remains symptomatic despite maximum doses, please call PMT at [phone] between 0700 and 1900. Outside of these hours, please call attending, as PMT  does not have night coverage.  "

## 2024-10-31 NOTE — Progress Notes (Signed)
" ° ° ° °  Patient Name: Jody Shaw           DOB: 03/28/1941  MRN: [id number]      Admission Date: 10/29/2024  Attending Provider: Cheryle Page, MD  Primary Diagnosis: Severe sepsis without septic shock (HCC)   Level of care: Stepdown   OVERNIGHT EVENT   Hypotensive, SBP 60-80's when rechecked All other vital stable, afebrile.  No overt bleeding reported.  No significant GI loss. Appears to be asymptomatic, alert and oriented.  Denies lightheadedness, weakness. She had improvement yesterday with midodrine  use.  Will add 10 mg p.o. midodrine  as well as 500 cc bolus. Nursing staff to continue monitoring for acute changes.  Report back if SBP<90    Lavanda Horns, DNP, ACNPC- AG [facility] [place]    "

## 2024-10-31 NOTE — Progress Notes (Addendum)
 " PROGRESS NOTE    Jody Shaw  FMW:[id number] DOB: 1941-05-07 DOA: 10/29/2024 PCP: Nichole Senior, MD   Brief Narrative:  83 y.o. female with medical history significant for giant cell arteritis, hypothyroidism, CAD, GERD, lymphedema, HTN, HLD and anxiety was sent by her PCP for evaluation and management of pneumonia and elevated creatinine.  On admission, patient was hypothermic with creatinine of 2.76, bicarb of 15, WBC of 13.3, lactic acid of 1.6 and 1.2 with negative flu/RSV/COVID.  Chest x-ray showed no active disease.  CT of the chest showed no acute intrathoracic pathology but showed a moderate-sized hiatal hernia.  She was started on IV fluids and antibiotics.  Assessment & Plan:   Severe sepsis: Present on admission Concern for community-acquired bacterial pneumonia: Bacteria unspecified UTI: Present on admission MSSA bacteremia -Presented with generalized weakness and cough and chest x-ray at PCPs office showed pneumonia however repeat CXR and CT chest on admission with no acute abnormalities.  -Had hypotension, hypothermia, leukocytosis and possible pneumonia/UTI on admission - Blood cultures growing possible MSSA.  ID has been consulted: Antibiotics-switch to IV cefazolin .  Will repeat blood cultures from today; follow 2D echo.  Will possibly need TEE at some point.  Respiratory panel PCR negative.   Blood pressures intermittently on the lower side.  Continue IV fluids.  Use midodrine  if needed.  Switch to stress dose steroids.  Continue  Lactic acidosis - Lactic acid worsened since admission but improved to 2.9 on 10/30/2024.  AKI on CKD stage IIIb - Baseline creatinine of 1.6-1.7.  Creatinine 2.76 on presentation.  Creatinine 2.43 this morning.  Continue IV fluids.  Acute metabolic acidosis - Switch IV fluids to bicarb drip  Acute on chronic anemia of chronic disease - Baseline of 7.5-9.  Hemoglobin 6.4 on 10/30/2024.  No signs of bleeding.  S/p 1 unit packed red cell  transfusion.  Hemoglobin 9.4 this morning.  Monitor H&H.  Continue to hold Lovenox  for now.  Hypothermia - Still having intermittent hypothermia.  Use Bair hugger as needed  Hypertension - Antihypertensives on hold  Hyperlipidemia - Continue statin  Hypothyroidism -Continue levothyroxine   Anxiety and depression -Continue sertraline   Chronic lymphedema - Outpatient follow-up.  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  Steroid plan as above  Generalized weakness -PT eval once patient is slightly more stable  Grief -Reports difficulty adjusting after losing her husband of 66 years last year - Chaplain consulted for spiritual/emotional support  Goals of care - Tried to have goals of care discussion with the patient: Wants to remain full code for now.  palliative care consultation for goals of care discussion is pending.    DVT prophylaxis: Off Lovenox .  Cannot use SCDs because of chronic lymphedema  code Status: Full Family Communication: Daughter at bedside Disposition Plan: Status is: Inpatient Remains inpatient appropriate because: Of severity of illness    Consultants: Palliative care  Procedures: None  Antimicrobials:  Anti-infectives (From admission, onward)    Start     Dose/Rate Route Frequency Ordered Stop   10/30/24 1600  azithromycin  (ZITHROMAX ) 500 mg in sodium chloride  0.9 % 250 mL IVPB  Status:  Discontinued        500 mg 250 mL/hr over 60 Minutes Intravenous Every 24 hours 10/30/24 1225 10/30/24 1347   10/30/24 1400  cefTRIAXone  (ROCEPHIN ) 2 g in sodium chloride  0.9 % 100 mL IVPB  Status:  Discontinued  2 g 200 mL/hr over 30 Minutes Intravenous Every 24 hours 10/30/24 1225 10/30/24 1347   10/30/24 1400  ceFAZolin  (ANCEF ) IVPB 2g/100 mL premix  Status:  Discontinued        2 g 200 mL/hr over 30 Minutes Intravenous Every 8 hours 10/30/24 1347 10/30/24 1352    10/30/24 1400  ceFAZolin  (ANCEF ) IVPB 2g/100 mL premix        2 g 200 mL/hr over 30 Minutes Intravenous Every 12 hours 10/30/24 1352     10/29/24 1445  cefTRIAXone  (ROCEPHIN ) 2 g in sodium chloride  0.9 % 100 mL IVPB        2 g 200 mL/hr over 30 Minutes Intravenous Once 10/29/24 1430 10/29/24 1525   10/29/24 1445  azithromycin  (ZITHROMAX ) 500 mg in sodium chloride  0.9 % 250 mL IVPB        500 mg 250 mL/hr over 60 Minutes Intravenous  Once 10/29/24 1430 10/29/24 1629        Subjective: Patient seen and examined at bedside.  Poor historian.  Slow to respond.  Had episodes of hypotension overnight as per nursing staff.  Continues to have diarrhea.  No fever, agitation reported. Objective: Vitals:   10/31/24 0100 10/31/24 0453 10/31/24 0455 10/31/24 0517  BP: (!) 92/40 (!) 103/49  (!) 90/41  Pulse: 66 66  (!) 31  Resp: 19 18  (!) 33  Temp:   (!) 95.5 F (35.3 C)   TempSrc:   Axillary   SpO2: 94% 98%  (!) 86%    Intake/Output Summary (Last 24 hours) at 10/31/2024 0723 Last data filed at 10/31/2024 9297 Gross per 24 hour  Intake 3986.85 ml  Output --  Net 3986.85 ml   There were no vitals filed for this visit.  Examination:  General: On 2 L oxygen via nasal cannula.  No distress. ENT/neck: No thyromegaly.  JVD is not elevated  respiratory: Decreased breath sounds at bases bilaterally with some crackles; no wheezing with intermittent tachypnea CVS: S1-S2 heard, rate controlled currently Abdominal: Soft, nontender, slightly distended; no organomegaly, bowel sounds are heard Extremities: Trace lower extremity edema; no cyanosis.  Bilateral lower extremities are wrapped CNS: Awake and alert.  Remains slow to respond.  Poor historian.  Seems no focal neurologic deficit.  Moves extremities Lymph: No obvious lymphadenopathy Skin: No obvious petechia/rashes  psych: Extremely flat affect.  Currently not agitated musculoskeletal: No obvious joint swelling/deformity    Data  Reviewed: I have personally reviewed following labs and imaging studies  CBC: Recent Labs  Lab 10/29/24 1441 10/30/24 0709 10/30/24 1525 10/31/24 0311  WBC 13.3* 12.2*  --  10.8*  NEUTROABS 11.1*  --   --  9.8*  HGB 8.9* 6.4* 9.0* 9.4*  HCT 29.8* 20.7* 28.5* 30.3*  MCV 86.4 85.2  --  86.3  PLT 264 213  --  201   Basic Metabolic Panel: Recent Labs  Lab 10/29/24 1441 10/30/24 0546 10/31/24 0311  NA 139 140 142  K 5.1 4.6 4.9  CL 107 111 113*  CO2 15* 15* 14*  GLUCOSE 113* 80 77  BUN 61* 49* 48*  CREATININE 2.76* 2.36* 2.43*  CALCIUM  8.5* 7.3* 7.5*  MG  --   --  1.8   GFR: CrCl cannot be calculated (Unknown ideal weight.). Liver Function Tests: Recent Labs  Lab 10/29/24 1441 10/31/24 0311  AST 20 23  ALT 24 15  ALKPHOS 124 99  BILITOT 0.2 0.2  PROT 7.0 5.2*  ALBUMIN 3.2* 2.4*   No  results for input(s): LIPASE, AMYLASE in the last 168 hours. No results for input(s): AMMONIA in the last 168 hours. Coagulation Profile: Recent Labs  Lab 10/29/24 1441  INR 1.2   Cardiac Enzymes: No results for input(s): CKTOTAL, CKMB, CKMBINDEX, TROPONINI in the last 168 hours. BNP (last 3 results) No results for input(s): PROBNP in the last 8760 hours. HbA1C: No results for input(s): HGBA1C in the last 72 hours. CBG: No results for input(s): GLUCAP in the last 168 hours. Lipid Profile: No results for input(s): CHOL, HDL, LDLCALC, TRIG, CHOLHDL, LDLDIRECT in the last 72 hours. Thyroid Function Tests: No results for input(s): TSH, T4TOTAL, FREET4, T3FREE, THYROIDAB in the last 72 hours. Anemia Panel: No results for input(s): VITAMINB12, FOLATE, FERRITIN, TIBC, IRON, RETICCTPCT in the last 72 hours. Sepsis Labs: Recent Labs  Lab 10/29/24 1512 10/29/24 1710 10/30/24 0302 10/30/24 0546  PROCALCITON  --   --   --  0.28  LATICACIDVEN 1.6 1.2 3.5* 2.9*    Recent Results (from the past 240 hours)  Resp panel by  RT-PCR (RSV, Flu A&B, Covid)     Status: None   Collection Time: 10/29/24  2:31 PM   Specimen: Nasal Swab  Result Value Ref Range Status   SARS Coronavirus 2 by RT PCR NEGATIVE NEGATIVE Final    Comment: (NOTE) SARS-CoV-2 target nucleic acids are NOT DETECTED.  The SARS-CoV-2 RNA is generally detectable in upper respiratory specimens during the acute phase of infection. The lowest concentration of SARS-CoV-2 viral copies this assay can detect is 138 copies/mL. A negative result does not preclude SARS-Cov-2 infection and should not be used as the sole basis for treatment or other patient management decisions. A negative result may occur with  improper specimen collection/handling, submission of specimen other than nasopharyngeal swab, presence of viral mutation(s) within the areas targeted by this assay, and inadequate number of viral copies(<138 copies/mL). A negative result must be combined with clinical observations, patient history, and epidemiological information. The expected result is Negative.  Fact Sheet for Patients:  bloggercourse.com  Fact Sheet for Healthcare Providers:  seriousbroker.it  This test is no t yet approved or cleared by the [place]  FDA and  has been authorized for detection and/or diagnosis of SARS-CoV-2 by FDA under an Emergency Use Authorization (EUA). This EUA will remain  in effect (meaning this test can be used) for the duration of the COVID-19 declaration under Section 564(b)(1) of the Act, 21 U.S.C.section 360bbb-3(b)(1), unless the authorization is terminated  or revoked sooner.       Influenza A by PCR NEGATIVE NEGATIVE Final   Influenza B by PCR NEGATIVE NEGATIVE Final    Comment: (NOTE) The Xpert Xpress SARS-CoV-2/FLU/RSV plus assay is intended as an aid in the diagnosis of influenza from Nasopharyngeal swab specimens and should not be used as a sole basis for treatment. Nasal washings  and aspirates are unacceptable for Xpert Xpress SARS-CoV-2/FLU/RSV testing.  Fact Sheet for Patients: bloggercourse.com  Fact Sheet for Healthcare Providers: seriousbroker.it  This test is not yet approved or cleared by the [place]  FDA and has been authorized for detection and/or diagnosis of SARS-CoV-2 by FDA under an Emergency Use Authorization (EUA). This EUA will remain in effect (meaning this test can be used) for the duration of the COVID-19 declaration under Section 564(b)(1) of the Act, 21 U.S.C. section 360bbb-3(b)(1), unless the authorization is terminated or revoked.     Resp Syncytial Virus by PCR NEGATIVE NEGATIVE Final    Comment: (NOTE) Fact  Sheet for Patients: bloggercourse.com  Fact Sheet for Healthcare Providers: seriousbroker.it  This test is not yet approved or cleared by the [place]  FDA and has been authorized for detection and/or diagnosis of SARS-CoV-2 by FDA under an Emergency Use Authorization (EUA). This EUA will remain in effect (meaning this test can be used) for the duration of the COVID-19 declaration under Section 564(b)(1) of the Act, 21 U.S.C. section 360bbb-3(b)(1), unless the authorization is terminated or revoked.  Performed at [facility], [address] [place], [place] [address]   Respiratory (~20 pathogens) panel by PCR     Status: None   Collection Time: 10/29/24  2:31 PM   Specimen: Nasopharyngeal Swab; Respiratory  Result Value Ref Range Status   Adenovirus NOT DETECTED NOT DETECTED Final   Coronavirus 229E NOT DETECTED NOT DETECTED Final    Comment: (NOTE) The Coronavirus on the Respiratory Panel, DOES NOT test for the novel  Coronavirus (2019 nCoV)    Coronavirus HKU1 NOT DETECTED NOT DETECTED Final   Coronavirus NL63 NOT DETECTED NOT DETECTED Final   Coronavirus OC43 NOT DETECTED NOT DETECTED  Final   Metapneumovirus NOT DETECTED NOT DETECTED Final   Rhinovirus / Enterovirus NOT DETECTED NOT DETECTED Final   Influenza A NOT DETECTED NOT DETECTED Final   Influenza B NOT DETECTED NOT DETECTED Final   Parainfluenza Virus 1 NOT DETECTED NOT DETECTED Final   Parainfluenza Virus 2 NOT DETECTED NOT DETECTED Final   Parainfluenza Virus 3 NOT DETECTED NOT DETECTED Final   Parainfluenza Virus 4 NOT DETECTED NOT DETECTED Final   Respiratory Syncytial Virus NOT DETECTED NOT DETECTED Final   Bordetella pertussis NOT DETECTED NOT DETECTED Final   Bordetella Parapertussis NOT DETECTED NOT DETECTED Final   Chlamydophila pneumoniae NOT DETECTED NOT DETECTED Final   Mycoplasma pneumoniae NOT DETECTED NOT DETECTED Final    Comment: Performed at [facility], [address] [place], [place] [address]  Blood Culture (routine x 2)     Status: None (Preliminary result)   Collection Time: 10/29/24  3:25 PM   Specimen: BLOOD  Result Value Ref Range Status   Specimen Description   Final    BLOOD SITE NOT SPECIFIED Performed at [facility], [address] [place], [place] [address]    Special Requests   Final    BOTTLES DRAWN AEROBIC AND ANAEROBIC Blood Culture results may not be optimal due to an inadequate volume of blood received in culture bottles Performed at [facility], [address], [place], [place] [address]    Culture  Setup Time   Final    GRAM POSITIVE COCCI IN CLUSTERS IN BOTH AEROBIC AND ANAEROBIC BOTTLES CRITICAL VALUE NOTED.  VALUE IS CONSISTENT WITH PREVIOUSLY REPORTED AND CALLED VALUE. Performed at [facility], [address] [place], [place] [address]    Culture GRAM POSITIVE COCCI  Final   Report Status PENDING  Incomplete  Blood Culture (routine x 2)     Status: None (Preliminary result)   Collection Time: 10/29/24  3:40 PM   Specimen: BLOOD  Result Value Ref Range Status   Specimen Description   Final    BLOOD  SITE NOT SPECIFIED Performed at [facility], [address] [place], [place] [address]    Special Requests   Final    BOTTLES DRAWN AEROBIC AND ANAEROBIC Blood Culture results may not be optimal due to an inadequate volume of blood received in culture bottles Performed  at [facility], [address], [place], [place] [address]    Culture  Setup Time   Final    GRAM POSITIVE COCCI IN CLUSTERS IN BOTH AEROBIC AND ANAEROBIC BOTTLES CRITICAL RESULT CALLED TO, READ BACK BY AND VERIFIED WITH: MAYA LOUANN LABOR 1327 [id number] FCP Performed at [facility], [address] [place], [place] [address]    Culture GRAM POSITIVE COCCI  Final   Report Status PENDING  Incomplete  Blood Culture ID Panel (Reflexed)     Status: Abnormal   Collection Time: 10/29/24  3:40 PM  Result Value Ref Range Status   Enterococcus faecalis NOT DETECTED NOT DETECTED Final   Enterococcus Faecium NOT DETECTED NOT DETECTED Final   Listeria monocytogenes NOT DETECTED NOT DETECTED Final   Staphylococcus species DETECTED (A) NOT DETECTED Final    Comment: CRITICAL RESULT CALLED TO, READ BACK BY AND VERIFIED WITH: PHARMD UTOMWEN, A 1327 [id number] FCP    Staphylococcus aureus (BCID) DETECTED (A) NOT DETECTED Final    Comment: CRITICAL RESULT CALLED TO, READ BACK BY AND VERIFIED WITH: PHARMD UTOMWEN, A 1327 [id number] FCP    Staphylococcus epidermidis NOT DETECTED NOT DETECTED Final   Staphylococcus lugdunensis NOT DETECTED NOT DETECTED Final   Streptococcus species NOT DETECTED NOT DETECTED Final   Streptococcus agalactiae NOT DETECTED NOT DETECTED Final   Streptococcus pneumoniae NOT DETECTED NOT DETECTED Final   Streptococcus pyogenes NOT DETECTED NOT DETECTED Final   A.calcoaceticus-baumannii NOT DETECTED NOT DETECTED Final   Bacteroides fragilis NOT DETECTED NOT DETECTED Final   Enterobacterales NOT DETECTED NOT DETECTED Final   Enterobacter cloacae complex NOT DETECTED NOT  DETECTED Final   Escherichia coli NOT DETECTED NOT DETECTED Final   Klebsiella aerogenes NOT DETECTED NOT DETECTED Final   Klebsiella oxytoca NOT DETECTED NOT DETECTED Final   Klebsiella pneumoniae NOT DETECTED NOT DETECTED Final   Proteus species NOT DETECTED NOT DETECTED Final   Salmonella species NOT DETECTED NOT DETECTED Final   Serratia marcescens NOT DETECTED NOT DETECTED Final   Haemophilus influenzae NOT DETECTED NOT DETECTED Final   Neisseria meningitidis NOT DETECTED NOT DETECTED Final   Pseudomonas aeruginosa NOT DETECTED NOT DETECTED Final   Stenotrophomonas maltophilia NOT DETECTED NOT DETECTED Final   Candida albicans NOT DETECTED NOT DETECTED Final   Candida auris NOT DETECTED NOT DETECTED Final   Candida glabrata NOT DETECTED NOT DETECTED Final   Candida krusei NOT DETECTED NOT DETECTED Final   Candida parapsilosis NOT DETECTED NOT DETECTED Final   Candida tropicalis NOT DETECTED NOT DETECTED Final   Cryptococcus neoformans/gattii NOT DETECTED NOT DETECTED Final   Meth resistant mecA/C and MREJ NOT DETECTED NOT DETECTED Final    Comment: Performed at [facility], [address] [place], [place] [address]  MRSA Next Gen by PCR, Nasal     Status: None   Collection Time: 10/29/24 11:12 PM   Specimen: Nasal Mucosa; Nasal Swab  Result Value Ref Range Status   MRSA by PCR Next Gen NOT DETECTED NOT DETECTED Final    Comment: (NOTE) The GeneXpert MRSA Assay (FDA approved for NASAL specimens only), is one component of a comprehensive MRSA colonization surveillance program. It is not intended to diagnose MRSA infection nor to guide or monitor treatment for MRSA infections. Test performance is not FDA approved in patients less than 108 years old. Performed at [facility], [address] [place], [place] [address]   C Difficile Quick Screen w PCR reflex  Status: None   Collection Time: 10/30/24  4:44 PM   Specimen: STOOL  Result Value Ref Range  Status   C Diff antigen NEGATIVE NEGATIVE Final   C Diff toxin NEGATIVE NEGATIVE Final   C Diff interpretation No C. difficile detected.  Final    Comment: Performed at [facility], [address], [place], [place] [address]         Radiology Studies: CT Chest Wo Contrast Result Date: 10/29/2024 CLINICAL DATA:  Nontraumatic chest wall pain. Concern for inflammatory/infectious etiology. EXAM: CT CHEST WITHOUT CONTRAST TECHNIQUE: Multidetector CT imaging of the chest was performed following the standard protocol without IV contrast. RADIATION DOSE REDUCTION: This exam was performed according to the departmental dose-optimization program which includes automated exposure control, adjustment of the mA and/or kV according to patient size and/or use of iterative reconstruction technique. COMPARISON:  Chest radiograph dated 10/29/2024. FINDINGS: Evaluation of this exam is limited in the absence of intravenous contrast. Cardiovascular: There is no cardiomegaly or pericardial effusion. There is coronary vascular calcification and calcification of the mitral annulus. Moderate atherosclerotic calcification of the thoracic aorta. No aneurysmal dilatation. The central pulmonary arteries are grossly unremarkable. Mediastinum/Nodes: No hilar or mediastinal adenopathy. Moderate size hiatal hernia. The esophagus is grossly unremarkable no mediastinal fluid collection. Lungs/Pleura: Bibasilar subpleural atelectasis. Biapical subpleural linear scarring. No consolidative changes. There is no pleural effusion pneumothorax. The central airways are patent. Upper Abdomen: No acute abnormality. Musculoskeletal: Osteopenia with degenerative changes of the spine. No acute osseous pathology. No fluid collection. IMPRESSION: 1. No acute intrathoracic pathology. 2. Moderate size hiatal hernia. 3.  Aortic Atherosclerosis (ICD10-I70.0). Electronically Signed   By: Vanetta Chou M.D.   On: 10/29/2024 17:01    DG Chest Port 1 View Result Date: 10/29/2024 EXAM: 1 VIEW(S) XRAY OF THE CHEST 10/29/2024 02:46:00 PM COMPARISON: Comparison with 03/24/2024. CLINICAL HISTORY: Questionable sepsis - evaluate for abnormality. FINDINGS: LUNGS AND PLEURA: Shallow inspiration with elevation of the right hemidiaphragm. Mild linear atelectasis or scarring in the lung bases. No airspace disease or consolidation. No pleural effusion or pneumothorax. HEART AND MEDIASTINUM: Moderate-sized hiatal hernia behind the heart. Heart size and pulmonary vascularity are normal. Mediastinal contours appear intact. BONES AND SOFT TISSUES: Degenerative changes in the spine and shoulders. No acute osseous abnormality. IMPRESSION: 1. No acute cardiopulmonary abnormality. Electronically signed by: Elsie Gravely MD 10/29/2024 03:17 PM EST RP Workstation: [id number]        Scheduled Meds:  Chlorhexidine  Gluconate Cloth  6 each Topical Daily   levothyroxine   88 mcg Oral Q0600   potassium chloride   10 mEq Oral Daily   predniSONE   5 mg Oral Q breakfast   rosuvastatin   10 mg Oral Once per day on Sunday Wednesday   sertraline   25 mg Oral Daily   Continuous Infusions:  sodium chloride  100 mL/hr at 10/31/24 9297    ceFAZolin  (ANCEF ) IV Stopped (10/30/24 2201)          Sophie Mao, MD [facility] 10/31/2024, 7:23 AM   "

## 2024-10-31 NOTE — Consult Note (Signed)
 "  NAME:  Jody Shaw, MRN:  [id number], DOB:  02-Jun-1941, LOS: 2 ADMISSION DATE:  10/29/2024, CONSULTATION DATE:  12/19 REFERRING MD:  Dr. Cheryle, CHIEF COMPLAINT:  shock   History of Present Illness:  Patient is a 83 yo F w/ pertinent PMH giant cell arteritis, hypothyroidism, CAD, GERD, lymphedema, HTN, HLD and anxiety presents to [facility] ED on 12/17 for pna and elevated creat.  On 12/17 patient sent by PCP to [facility] Ed for eval of pna and elevated creat. On arrival hypothermic and creat 2.76. wbc 13.3 and la 1.6. CXR unremarkable. Covid/flu/rsv and RVP negative. CT chest no acute abnormality; moderate hiatal hernia. UA w/ small leukocytes. Cultures obtained, given fluids, and started on rocephin /azithro. Strep pna negative and legionella pending. Cultures came back w/ MSSA. ID consulted and abx changed to ancef . Echo pending. On 12/19 bp soft getting fluids and midodrine . Added stress dose steroids. Pccm consulted.   Pertinent  Medical History   Past Medical History:  Diagnosis Date   Coronary artery disease    s/p rotational atherectomy to the LAD in 1996 // Nuc 9/13: no ischemia   Echocardiogram    Echo 9/19:  EF 55-60, normal wall motion, grade 1 diastolic dysfunction, MAC   GERD (gastroesophageal reflux disease)    Giant cell arteritis (HCC)    HLD (hyperlipidemia)    Hypothyroidism    Vision loss of left eye      Significant Hospital Events: Including procedures, antibiotic start and stop dates in addition to other pertinent events   12/17 admit 12/19 hypotension despite fluids; pccm consulted  Interim History / Subjective:  See above  Objective    Blood pressure (!) 90/41, pulse (!) 31, temperature (!) 95.6 F (35.3 C), temperature source Axillary, resp. rate (!) 33, SpO2 (!) 86%.        Intake/Output Summary (Last 24 hours) at 10/31/2024 1039 Last data filed at 10/31/2024 0702 Gross per 24 hour  Intake 3986.85 ml  Output 550 ml  Net 3436.85 ml   There were no  vitals filed for this visit.  Examination: General: ill appearing female in NAD HEENT: MM pink/moist Neuro: Aox3; weak but mae CV: s1s2, RRR, no m/r/g PULM:  dim clear BS bilaterally GI: soft, bsx4 active Extremities: warm/dry, ble wrapped Skin: no rashes or lesions    Resolved problem list   Assessment and Plan   Septic shock Possible cap vs. UTI MSSA bacteremia Hypothermia Plan: -map goal >65; consider levo -given fluids -cont bicarb drip; LA continues to be trending down -cont midodrine  -on chronic prednisone  for giant cell arteritis; agree w/ stress dose steroids -cont ancef ; id following; follow cultures -gi panel pending; cdiff negative -cont bair hugger -echo pending; if negative will eventually need TEE  AKI on CKD 3b LA Metabolic acidosis Plan: -cont bicarb drip -Trend BMP / urinary output -Replace electrolytes as indicated -Avoid nephrotoxic agents, ensure adequate renal perfusion  Anemia of CKD Plan: -Trend cbc  Chronic Lymphedema Plan: -woc following; continue dressing wrap -pt/ot -f/u outpt vascular  Anxiety Plan: -sertraline   Hx of giant cell arteritis -on chronic prednisone  Plan: -stress dose steroids as above  Hypothyroidism Plan: -tsh -synthroid   CAD HTN HLD Plan: -asa and statin -hold anti-htn meds   GOC: patient is currently listed as DNR/DNI; palliative care following  Labs   CBC: Recent Labs  Lab 10/29/24 1441 10/30/24 0709 10/30/24 1525 10/31/24 0311  WBC 13.3* 12.2*  --  10.8*  NEUTROABS 11.1*  --   --  9.8*  HGB 8.9* 6.4* 9.0* 9.4*  HCT 29.8* 20.7* 28.5* 30.3*  MCV 86.4 85.2  --  86.3  PLT 264 213  --  201    Basic Metabolic Panel: Recent Labs  Lab 10/29/24 1441 10/30/24 0546 10/31/24 0311  NA 139 140 142  K 5.1 4.6 4.9  CL 107 111 113*  CO2 15* 15* 14*  GLUCOSE 113* 80 77  BUN 61* 49* 48*  CREATININE 2.76* 2.36* 2.43*  CALCIUM  8.5* 7.3* 7.5*  MG  --   --  1.8   GFR: CrCl cannot be  calculated (Unknown ideal weight.). Recent Labs  Lab 10/29/24 1441 10/29/24 1512 10/29/24 1710 10/30/24 0302 10/30/24 0546 10/30/24 0709 10/31/24 0311  PROCALCITON  --   --   --   --  0.28  --   --   WBC 13.3*  --   --   --   --  12.2* 10.8*  LATICACIDVEN  --  1.6 1.2 3.5* 2.9*  --   --     Liver Function Tests: Recent Labs  Lab 10/29/24 1441 10/31/24 0311  AST 20 23  ALT 24 15  ALKPHOS 124 99  BILITOT 0.2 0.2  PROT 7.0 5.2*  ALBUMIN  3.2* 2.4*   No results for input(s): LIPASE, AMYLASE in the last 168 hours. No results for input(s): AMMONIA in the last 168 hours.  ABG No results found for: PHART, PCO2ART, PO2ART, HCO3, TCO2, ACIDBASEDEF, O2SAT   Coagulation Profile: Recent Labs  Lab 10/29/24 1441  INR 1.2    Cardiac Enzymes: No results for input(s): CKTOTAL, CKMB, CKMBINDEX, TROPONINI in the last 168 hours.  HbA1C: No results found for: HGBA1C  CBG: No results for input(s): GLUCAP in the last 168 hours.   Past Medical History:  She,  has a past medical history of Coronary artery disease, Echocardiogram, GERD (gastroesophageal reflux disease), Giant cell arteritis (HCC), HLD (hyperlipidemia), Hypothyroidism, and Vision loss of left eye.   Surgical History:   Past Surgical History:  Procedure Laterality Date   ARTERY BIOPSY Left 08/03/2017   Procedure: BIOPSY TEMPORAL ARTERY;  Surgeon: Stevie, Herlene Righter, MD;  Location: [facility];  Service: General;  Laterality: Left;     Social History:   reports that she has quit smoking. She has never used smokeless tobacco. She reports that she does not drink alcohol  and does not use drugs.   Family History:  Her family history includes Heart disease in her father and mother.   Allergies Allergies[1]   Home Medications  Prior to Admission medications  Medication Sig Start Date End Date Taking? Authorizing Provider  aspirin  EC 81 MG tablet Take 81 mg by mouth daily. Swallow  whole.   Yes [provider]  atenolol  (TENORMIN ) 25 MG tablet TAKE 1 TABLET BY MOUTH EVERY DAY. Patient taking differently: Take 25 mg by mouth in the morning. 11/09/23  Yes Jerilynn Lamarr HERO, NP  calcium  carbonate (TUMS - DOSED IN MG ELEMENTAL CALCIUM ) 500 MG chewable tablet Chew 1 tablet by mouth 2 (two) times daily as needed for indigestion or heartburn.   Yes [provider]  Dextromethorphan-guaiFENesin  (GOODSENSE TUSSIN DM) 20-200 MG/20ML LIQD Take 2.5 mLs by mouth daily as needed (for coughing).   Yes [provider]  ibuprofen (ADVIL) 200 MG tablet Take 400 mg by mouth daily as needed for mild pain (pain score 1-3), headache or fever.   Yes [provider]  isosorbide  mononitrate (IMDUR ) 60 MG 24 hr tablet Take 30 mg  by mouth every evening.   Yes [provider]  levothyroxine  (SYNTHROID , LEVOTHROID) 88 MCG tablet Take 88 mcg by mouth daily before breakfast.   Yes [provider]  Multiple Vitamin (MULTI VITAMIN DAILY) TABS Take 1 tablet by mouth daily with breakfast.   Yes [provider]  nitroGLYCERIN  (NITROSTAT ) 0.4 MG SL tablet Place 1 tablet (0.4 mg total) under the tongue every 5 (five) minutes as needed for chest pain. 10/19/22  Yes Conte, Tessa N, PA-C  potassium chloride  (KLOR-CON ) 10 MEQ tablet Take 10 mEq by mouth daily.   Yes [provider]  predniSONE  (DELTASONE ) 5 MG tablet Take 5 mg by mouth daily with breakfast.   Yes [provider]  rosuvastatin  (CRESTOR ) 10 MG tablet Take 1 tablet by mouth 2 times a week. Patient taking differently: Take 10 mg by mouth See admin instructions. Take 10 mg by mouth at bedtime on Sundays and Wednesdays 11/09/23  Yes Jerilynn Lamarr HERO, NP  sertraline  (ZOLOFT ) 25 MG tablet Take 25 mg by mouth daily. 02/15/24  Yes [provider]  sodium chloride  (MURO 128) 5 % ophthalmic solution Place 1 drop into the right eye every 4 (four) hours as needed for eye  irritation.   Yes [provider]  SYSTANE HYDRATION PF 0.4-0.3 % SOLN Place 1 drop into both eyes 3 (three) times daily as needed (for dryness).   Yes [provider]  triamcinolone cream (KENALOG) 0.1 % Apply 1 Application topically every 2 (two) hours as needed (for itching).   Yes [provider]  isosorbide  mononitrate (IMDUR ) 30 MG 24 hr tablet TAKE 1 TABLET BY MOUTH EVERY EVENING Patient not taking: Reported on 10/29/2024 03/11/24   [name], [name], DO  potassium chloride  SA (K-DUR) 20 MEQ tablet Take 1 tablet (20 mEq total) by mouth daily. Patient not taking: Reported on 10/29/2024 05/09/19   Claudene Victory ORN, MD     Critical care time: 45 minutes    JD Emilio DEVONNA [facility] 10/31/2024, 10:39 AM  Please see [email or web address] for pager details.  From 7A-7P if no response, please call [phone]. After hours, please call ELink [phone].           [1]  Allergies Allergen Reactions   Ace Inhibitors Other (See Comments)    Severe AKI from dehydration and ARB   Angiotensin Receptor Blockers Other (See Comments)    Severe AKI from dehydration and ARB   Tetanus Toxoid, Adsorbed Swelling and Other (See Comments)    Arm became swollen   Watermelon [Citrullus Vulgaris] Swelling and Other (See Comments)    Tongue became swollen   Iodine Rash   Sulfa Antibiotics Rash   "

## 2024-10-31 NOTE — Progress Notes (Signed)
 "        [facility]  Date of Admission:  10/29/2024     Lines:  Peripheral iv's Urethral catheter 12/17-c   Abx: 12/18-c cefazolin    12/17-18 ceftriaxone jennell           Other: 12/19-c stress dose hydrocortisone   Chronic prednisone  outpatient                                                    Assessment: 83 yo female cad, hypothyroidism, hx giant cell arteritis, gerd, chronic venous stasis, sent 12/17 from pcp for cough and acute on chronic cr elevation, found to have mssa bacteremia and ams   Chest ct clear   12/17 respiratory viral pcr negative 12/17 bcx returned quickly with mssa 12/18 ucx in process   Cr unclear baseline given how much variation this year but 2.8 from 1.7 (back in 03/2024)   Currently altered and will need to further evaluate that along with sites of metastatic involvement once mentation improved   No cardiac device on imaging and no obvious prosthetics otherwise Bilateral venous stasis ?portal of entry     This seems to be all mssa sepsis and will adjust abx to reflect such     ------------- 10/31/24 id assessment Still hypothermic at times. Stress dose steroid started today 12/19 in setting of chronic prednisone  use for giant cell arteritis (home dose 5 mg daily)  12/18 ucx <10k insignificant growth  Bp soft suspect sepsis and adrenal issue. I do not appreciate cardiogenic shock on exam  Legs picture appear to suggest stasis dermatitis changes. Left heel tenderness and there is an echymosis there. Will keep eye on for cellulitis/nec fasc process  Mentating better than yesterday.     Pulm consulted by primary team for low bp   Diarrhea and primary team had ordered gi workup  Plan: Change abx to cefazolin  F/u 12/19 repeat bcx F/u tte Await gi pcr/cdiff screen Await tee as well next week - have asked cards Maintain standard isolation precaution Discussed with primary team  Principal Problem:    Severe sepsis without septic shock (HCC) Active Problems:   Chronic acquired lymphedema   Anxiety and depression   Hypothermia   Community acquired pneumonia   Generalized weakness   Acute renal failure superimposed on stage 3b chronic kidney disease (HCC)   Hypotension   MSSA bacteremia   Allergies[1]  Scheduled Meds:  Chlorhexidine  Gluconate Cloth  6 each Topical Daily   hydrocortisone  sod succinate (SOLU-CORTEF ) inj  100 mg Intravenous Q6H   levothyroxine   88 mcg Oral Q0600   potassium chloride   10 mEq Oral Daily   rosuvastatin   10 mg Oral Once per day on Sunday Wednesday   sertraline   25 mg Oral Daily   Continuous Infusions:   ceFAZolin  (ANCEF ) IV 2 g (10/31/24 0919)   sodium bicarbonate  150 mEq in dextrose  5 % 1,150 mL infusion 100 mL/hr at 10/31/24 0947   PRN Meds:.acetaminophen  **OR** acetaminophen , artificial tears, bisacodyl , guaiFENesin , ondansetron  **OR** ondansetron  (ZOFRAN ) IV, mouth rinse, senna-docusate   SUBJECTIVE: Hypothermic Bp soft Hydrocortisone  just added by team Diarrhea and gi pcr/cdiff ordered per primary team  Left foot pain No back or other joint pain  Review of Systems: ROS All other ROS was negative, except mentioned above     OBJECTIVE: Vitals:  10/31/24 0453 10/31/24 0455 10/31/24 0517 10/31/24 0700  BP: (!) 103/49  (!) 90/41   Pulse: 66  (!) 31   Resp: 18  (!) 33   Temp:  (!) 95.5 F (35.3 C)  (!) 95.6 F (35.3 C)  TempSrc:  Axillary  Axillary  SpO2: 98%  (!) 86%    There is no height or weight on file to calculate BMI.  Physical Exam General/constitutional: no distress, pleasant HEENT: Normocephalic, PER, Conj Clear, EOMI, Oropharynx clear Neck supple CV: rrr no mrg Lungs: clear to auscultation, normal respiratory effort Abd: Soft, Nontender Ext/skin:  Reviewed pictures from 12/18        Neuro: nonfocal MSK: no peripheral joint swelling/tenderness/warmth; back spines nontender   Central line presence:  peripheral iv's   Lab Results Lab Results  Component Value Date   WBC 10.8 (H) 10/31/2024   HGB 9.4 (L) 10/31/2024   HCT 30.3 (L) 10/31/2024   MCV 86.3 10/31/2024   PLT 201 10/31/2024    Lab Results  Component Value Date   CREATININE 2.43 (H) 10/31/2024   BUN 48 (H) 10/31/2024   NA 142 10/31/2024   K 4.9 10/31/2024   CL 113 (H) 10/31/2024   CO2 14 (L) 10/31/2024    Lab Results  Component Value Date   ALT 15 10/31/2024   AST 23 10/31/2024   ALKPHOS 99 10/31/2024   BILITOT 0.2 10/31/2024      Microbiology: Recent Results (from the past 240 hours)  Resp panel by RT-PCR (RSV, Flu A&B, Covid)     Status: None   Collection Time: 10/29/24  2:31 PM   Specimen: Nasal Swab  Result Value Ref Range Status   SARS Coronavirus 2 by RT PCR NEGATIVE NEGATIVE Final    Comment: (NOTE) SARS-CoV-2 target nucleic acids are NOT DETECTED.  The SARS-CoV-2 RNA is generally detectable in upper respiratory specimens during the acute phase of infection. The lowest concentration of SARS-CoV-2 viral copies this assay can detect is 138 copies/mL. A negative result does not preclude SARS-Cov-2 infection and should not be used as the sole basis for treatment or other patient management decisions. A negative result may occur with  improper specimen collection/handling, submission of specimen other than nasopharyngeal swab, presence of viral mutation(s) within the areas targeted by this assay, and inadequate number of viral copies(<138 copies/mL). A negative result must be combined with clinical observations, patient history, and epidemiological information. The expected result is Negative.  Fact Sheet for Patients:  bloggercourse.com  Fact Sheet for Healthcare Providers:  seriousbroker.it  This test is no t yet approved or cleared by the [place]  FDA and  has been authorized for detection and/or diagnosis of SARS-CoV-2 by FDA under an  Emergency Use Authorization (EUA). This EUA will remain  in effect (meaning this test can be used) for the duration of the COVID-19 declaration under Section 564(b)(1) of the Act, 21 U.S.C.section 360bbb-3(b)(1), unless the authorization is terminated  or revoked sooner.       Influenza A by PCR NEGATIVE NEGATIVE Final   Influenza B by PCR NEGATIVE NEGATIVE Final    Comment: (NOTE) The Xpert Xpress SARS-CoV-2/FLU/RSV plus assay is intended as an aid in the diagnosis of influenza from Nasopharyngeal swab specimens and should not be used as a sole basis for treatment. Nasal washings and aspirates are unacceptable for Xpert Xpress SARS-CoV-2/FLU/RSV testing.  Fact Sheet for Patients: bloggercourse.com  Fact Sheet for Healthcare Providers: seriousbroker.it  This test is not yet approved or cleared  by the [place]  FDA and has been authorized for detection and/or diagnosis of SARS-CoV-2 by FDA under an Emergency Use Authorization (EUA). This EUA will remain in effect (meaning this test can be used) for the duration of the COVID-19 declaration under Section 564(b)(1) of the Act, 21 U.S.C. section 360bbb-3(b)(1), unless the authorization is terminated or revoked.     Resp Syncytial Virus by PCR NEGATIVE NEGATIVE Final    Comment: (NOTE) Fact Sheet for Patients: bloggercourse.com  Fact Sheet for Healthcare Providers: seriousbroker.it  This test is not yet approved or cleared by the [place]  FDA and has been authorized for detection and/or diagnosis of SARS-CoV-2 by FDA under an Emergency Use Authorization (EUA). This EUA will remain in effect (meaning this test can be used) for the duration of the COVID-19 declaration under Section 564(b)(1) of the Act, 21 U.S.C. section 360bbb-3(b)(1), unless the authorization is terminated or revoked.  Performed at [facility], [address], [place], [place] [address]   Respiratory (~20 pathogens) panel by PCR     Status: None   Collection Time: 10/29/24  2:31 PM   Specimen: Nasopharyngeal Swab; Respiratory  Result Value Ref Range Status   Adenovirus NOT DETECTED NOT DETECTED Final   Coronavirus 229E NOT DETECTED NOT DETECTED Final    Comment: (NOTE) The Coronavirus on the Respiratory Panel, DOES NOT test for the novel  Coronavirus (2019 nCoV)    Coronavirus HKU1 NOT DETECTED NOT DETECTED Final   Coronavirus NL63 NOT DETECTED NOT DETECTED Final   Coronavirus OC43 NOT DETECTED NOT DETECTED Final   Metapneumovirus NOT DETECTED NOT DETECTED Final   Rhinovirus / Enterovirus NOT DETECTED NOT DETECTED Final   Influenza A NOT DETECTED NOT DETECTED Final   Influenza B NOT DETECTED NOT DETECTED Final   Parainfluenza Virus 1 NOT DETECTED NOT DETECTED Final   Parainfluenza Virus 2 NOT DETECTED NOT DETECTED Final   Parainfluenza Virus 3 NOT DETECTED NOT DETECTED Final   Parainfluenza Virus 4 NOT DETECTED NOT DETECTED Final   Respiratory Syncytial Virus NOT DETECTED NOT DETECTED Final   Bordetella pertussis NOT DETECTED NOT DETECTED Final   Bordetella Parapertussis NOT DETECTED NOT DETECTED Final   Chlamydophila pneumoniae NOT DETECTED NOT DETECTED Final   Mycoplasma pneumoniae NOT DETECTED NOT DETECTED Final    Comment: Performed at [facility], [address], [place], [place] [address]  Blood Culture (routine x 2)     Status: Abnormal (Preliminary result)   Collection Time: 10/29/24  3:25 PM   Specimen: BLOOD  Result Value Ref Range Status   Specimen Description   Final    BLOOD SITE NOT SPECIFIED Performed at [facility], [address] [place], [place] [address]    Special Requests   Final    BOTTLES DRAWN AEROBIC AND ANAEROBIC Blood Culture results may not be optimal due to an inadequate volume of blood received in culture bottles Performed at [facility], [address], [place], [place] [address]    Culture  Setup Time   Final    GRAM POSITIVE COCCI IN CLUSTERS IN BOTH AEROBIC AND ANAEROBIC BOTTLES CRITICAL VALUE NOTED.  VALUE IS CONSISTENT WITH PREVIOUSLY REPORTED AND CALLED VALUE. Performed at [facility], [address] [place], [place] [address]    Culture STAPHYLOCOCCUS AUREUS (A)  Final   Report Status PENDING  Incomplete  Blood Culture (routine x 2)     Status: Abnormal (Preliminary result)   Collection  Time: 10/29/24  3:40 PM   Specimen: BLOOD  Result Value Ref Range Status   Specimen Description   Final    BLOOD SITE NOT SPECIFIED Performed at [facility], [address], [place], [place] [address]    Special Requests   Final    BOTTLES DRAWN AEROBIC AND ANAEROBIC Blood Culture results may not be optimal due to an inadequate volume of blood received in culture bottles Performed at [facility], [address] [place], [place] [address]    Culture  Setup Time   Final    GRAM POSITIVE COCCI IN CLUSTERS IN BOTH AEROBIC AND ANAEROBIC BOTTLES CRITICAL RESULT CALLED TO, READ BACK BY AND VERIFIED WITH: PHARMD UTOMWEN, A 1327 [id number] FCP    Culture (A)  Final    STAPHYLOCOCCUS AUREUS SUSCEPTIBILITIES TO FOLLOW Performed at [facility], [address] [place], [place] [address]    Report Status PENDING  Incomplete  Blood Culture ID Panel (Reflexed)     Status: Abnormal   Collection Time: 10/29/24  3:40 PM  Result Value Ref Range Status   Enterococcus faecalis NOT DETECTED NOT DETECTED Final   Enterococcus Faecium NOT DETECTED NOT DETECTED Final   Listeria monocytogenes NOT DETECTED NOT DETECTED Final   Staphylococcus species DETECTED (A) NOT DETECTED Final    Comment: CRITICAL RESULT CALLED TO, READ BACK BY AND VERIFIED WITH: PHARMD UTOMWEN, A [id number] FCP    Staphylococcus aureus (BCID) DETECTED (A) NOT DETECTED Final    Comment: CRITICAL RESULT CALLED  TO, READ BACK BY AND VERIFIED WITH: PHARMD UTOMWEN, A 1327 [id number] FCP    Staphylococcus epidermidis NOT DETECTED NOT DETECTED Final   Staphylococcus lugdunensis NOT DETECTED NOT DETECTED Final   Streptococcus species NOT DETECTED NOT DETECTED Final   Streptococcus agalactiae NOT DETECTED NOT DETECTED Final   Streptococcus pneumoniae NOT DETECTED NOT DETECTED Final   Streptococcus pyogenes NOT DETECTED NOT DETECTED Final   A.calcoaceticus-baumannii NOT DETECTED NOT DETECTED Final   Bacteroides fragilis NOT DETECTED NOT DETECTED Final   Enterobacterales NOT DETECTED NOT DETECTED Final   Enterobacter cloacae complex NOT DETECTED NOT DETECTED Final   Escherichia coli NOT DETECTED NOT DETECTED Final   Klebsiella aerogenes NOT DETECTED NOT DETECTED Final   Klebsiella oxytoca NOT DETECTED NOT DETECTED Final   Klebsiella pneumoniae NOT DETECTED NOT DETECTED Final   Proteus species NOT DETECTED NOT DETECTED Final   Salmonella species NOT DETECTED NOT DETECTED Final   Serratia marcescens NOT DETECTED NOT DETECTED Final   Haemophilus influenzae NOT DETECTED NOT DETECTED Final   Neisseria meningitidis NOT DETECTED NOT DETECTED Final   Pseudomonas aeruginosa NOT DETECTED NOT DETECTED Final   Stenotrophomonas maltophilia NOT DETECTED NOT DETECTED Final   Candida albicans NOT DETECTED NOT DETECTED Final   Candida auris NOT DETECTED NOT DETECTED Final   Candida glabrata NOT DETECTED NOT DETECTED Final   Candida krusei NOT DETECTED NOT DETECTED Final   Candida parapsilosis NOT DETECTED NOT DETECTED Final   Candida tropicalis NOT DETECTED NOT DETECTED Final   Cryptococcus neoformans/gattii NOT DETECTED NOT DETECTED Final   Meth resistant mecA/C and MREJ NOT DETECTED NOT DETECTED Final    Comment: Performed at [facility], [address] [place], [place] [address]  MRSA Next Gen by PCR, Nasal     Status: None   Collection Time: 10/29/24 11:12 PM   Specimen: Nasal Mucosa; Nasal Swab  Result  Value Ref Range Status   MRSA by PCR Next  Gen NOT DETECTED NOT DETECTED Final    Comment: (NOTE) The GeneXpert MRSA Assay (FDA approved for NASAL specimens only), is one component of a comprehensive MRSA colonization surveillance program. It is not intended to diagnose MRSA infection nor to guide or monitor treatment for MRSA infections. Test performance is not FDA approved in patients less than 12 years old. Performed at [facility], [address], [place], [place] [address]   Urine Culture     Status: Abnormal   Collection Time: 10/30/24  4:00 AM   Specimen: Urine, Random  Result Value Ref Range Status   Specimen Description   Final    URINE, RANDOM Performed at [facility], [address] [place], [place] [address]    Special Requests   Final    NONE Reflexed from [id number] Performed at [facility], [address], [place], [place] [address]    Culture (A)  Final    <10,000 COLONIES/mL INSIGNIFICANT GROWTH Performed at [facility], [address], [place], [place] [address]    Report Status 10/31/2024 FINAL  Final  C Difficile Quick Screen w PCR reflex     Status: None   Collection Time: 10/30/24  4:44 PM   Specimen: STOOL  Result Value Ref Range Status   C Diff antigen NEGATIVE NEGATIVE Final   C Diff toxin NEGATIVE NEGATIVE Final   C Diff interpretation No C. difficile detected.  Final    Comment: Performed at [facility], [address] [place], [place] [address]  Culture, blood (Routine X 2) w Reflex to ID Panel     Status: None (Preliminary result)   Collection Time: 10/31/24  3:11 AM   Specimen: BLOOD  Result Value Ref Range Status   Specimen Description   Final    BLOOD SITE NOT SPECIFIED Performed at [facility], [address], [place], [place] [address]    Special Requests   Final    BOTTLES DRAWN AEROBIC ONLY Blood Culture results may not be optimal due to an  inadequate volume of blood received in culture bottles Performed at [facility], [address] [place], [place] [address]    Culture   Final    NO GROWTH < 12 HOURS Performed at [facility], [address], [place], [place] [address]    Report Status PENDING  Incomplete  Culture, blood (Routine X 2) w Reflex to ID Panel     Status: None (Preliminary result)   Collection Time: 10/31/24  3:11 AM   Specimen: BLOOD LEFT HAND  Result Value Ref Range Status   Specimen Description   Final    BLOOD LEFT HAND Performed at [facility], [address], [place], [place] [address]    Special Requests   Final    BOTTLES DRAWN AEROBIC ONLY Blood Culture results may not be optimal due to an inadequate volume of blood received in culture bottles Performed at [facility], [address] [place], [place] [address]    Culture   Final    NO GROWTH < 12 HOURS Performed at [facility], [address] [place], [place] [address]    Report Status PENDING  Incomplete     Serology:   Imaging: If present, new imagings (plain films, ct scans, and mri) have been personally visualized and interpreted; radiology reports have been reviewed. Decision making incorporated into the Impression / Recommendations.  12/19 pending tte   12/17 ct chest without contrast 1. No acute intrathoracic pathology. 2. Moderate size hiatal hernia. 3.  Aortic Atherosclerosis  Constance ONEIDA Passer, MD [facility] [phone] pager    10/31/2024, 10:41 AM     [1]  Allergies Allergen Reactions   Ace Inhibitors Other (See Comments)    Severe AKI from dehydration and ARB   Angiotensin Receptor Blockers Other (See Comments)    Severe AKI from dehydration and ARB   Tetanus Toxoid, Adsorbed Swelling and Other (See Comments)    Arm became swollen   Watermelon [Citrullus Vulgaris] Swelling and Other (See Comments)     Tongue became swollen   Iodine Rash   Sulfa Antibiotics Rash   "

## 2024-10-31 NOTE — Evaluation (Signed)
 Clinical/Bedside Swallow Evaluation Patient Details  Name: Jody Shaw MRN: [id number] Date of Birth: [birth year]  Today's Date: 10/31/2024 Time: SLP Start Time (ACUTE ONLY): 1106 SLP Stop Time (ACUTE ONLY): 1125 SLP Time Calculation (min) (ACUTE ONLY): 19 min  Past Medical History:  Past Medical History:  Diagnosis Date   Coronary artery disease    s/p rotational atherectomy to the LAD in 1996 // Nuc 9/13: no ischemia   Echocardiogram    Echo 9/19:  EF 55-60, normal wall motion, grade 1 diastolic dysfunction, MAC   GERD (gastroesophageal reflux disease)    Giant cell arteritis (HCC)    HLD (hyperlipidemia)    Hypothyroidism    Vision loss of left eye    Past Surgical History:  Past Surgical History:  Procedure Laterality Date   ARTERY BIOPSY Left 08/03/2017   Procedure: BIOPSY TEMPORAL ARTERY;  Surgeon: Stevie, Herlene Righter, MD;  Location: [facility];  Service: General;  Laterality: Left;   HPI:  Jody Shaw is an 83 yo female who was admitted on 10/29/24 from PCP office for evaluation and management of pneumonia and elevated creatinine. PmHx of giant cell arteritis, hypothyroidism, CAD, GERD, lymphedema, HTN, HLD and anxiety. SLP consulted for bedside swallow evaluation.    Assessment / Plan / Recommendation  Clinical Impression   Pt presents with a functional oropharyngeal swallow per clinical swallow assessment completed today. Oral prep and transit prompt with complete oral clearance. Pharyngeal swallow initiation appeared timely with laryngeal elevation noted. No overt or subtle s/s of aspiration noted across consistencies. Pt did report nonspecific, occasional dysphagia symptoms.  She only reported some challenges with swallowing meats, though did not expand further on this. This may be a symptom of esophageal dysmotility. Consider esophageal assessment if further complaints or concerns arise.   Recommend continue current diet at tolerated and PO meds as tolerated. No  acute SLP needs identified at this time. SLP will sign off. Please re-consult if pt exhibits concerns for aspiration with PO intake.   SLP Visit Diagnosis:  (r/o dysphagia)    Aspiration Risk  No limitations    Diet Recommendation Thin liquid;Regular    Liquid Administration via: Cup;Straw Medication Administration: Whole meds with liquid Supervision: Staff to assist with self feeding Compensations: Slow rate;Small sips/bites Postural Changes: Remain upright for at least 30 minutes after po intake (sit upright as tolerated)    Other Recommendations Oral Care Recommendations: Oral care BID      Assistance Recommended at Discharge  Defer to PT/OT  Functional Status Assessment Patient has not had a recent decline in their functional status (re: swallow function)  Frequency and Duration     D/c acute SLP       Prognosis Prognosis for improved oropharyngeal function: Good      Swallow Study   General HPI: Jody Shaw is an 83 yo female who was admitted on 10/29/24 from PCP office for evaluation and management of pneumonia and elevated creatinine. PmHx of giant cell arteritis, hypothyroidism, CAD, GERD, lymphedema, HTN, HLD and anxiety. SLP consulted for bedside swallow evaluation. Type of Study: Bedside Swallow Evaluation Previous Swallow Assessment: none per chart Diet Prior to this Study: Regular;Thin liquids (Level 0) Temperature Spikes Noted: No Respiratory Status: Nasal cannula (2L) History of Recent Intubation: No Behavior/Cognition: Alert;Cooperative;Pleasant mood Oral Cavity Assessment: Within Functional Limits Oral Care Completed by SLP: No Oral Cavity - Dentition: Missing dentition;Poor condition Vision: Functional for self-feeding Self-Feeding Abilities: Needs set up Patient Positioning: Upright in bed Baseline Vocal Quality: Normal  Volitional Cough: Strong Volitional Swallow: Able to elicit    Oral/Motor/Sensory Function Overall Oral Motor/Sensory Function:  Within functional limits   Ice Chips Ice chips: Not tested   Thin Liquid Thin Liquid: Within functional limits Presentation: Straw    Nectar Thick Nectar Thick Liquid: Not tested   Honey Thick Honey Thick Liquid: Not tested   Puree Puree: Within functional limits Presentation: Spoon   Solid     Solid: Within functional limits Presentation: Self Fed      [employer] 10/31/2024,11:56 AM

## 2024-10-31 NOTE — Progress Notes (Signed)
" ° °  [facility] has been requested to perform a transesophageal echocardiogram on Jody Shaw for bacteremia.  Echocardiogram  12/19 showed EF 65-70%, no wall motion abnormalities, grade II DD, normal RV systolic function, moderate MS    The patient does NOT have any absolute or relative contraindications to a Transesophageal Echocardiogram (TEE).  The patient has: Current Oxygen Requirement   Of note- patient consented on 12/19 for procedure scheduled 12/22. At the time of consent, patient was hypotensive with BP as low as 90/41. She also had an oxygen requirement. I recommend that MD review chart on 12/22 to make sure patient has clinically improved.   After careful review of history and examination, the risks and benefits of transesophageal echocardiogram have been explained including risks of esophageal damage, perforation (1:10,000 risk), bleeding, pharyngeal hematoma as well as other potential complications associated with conscious sedation including aspiration, arrhythmia, respiratory failure and death. Alternatives to treatment were discussed, questions were answered. Patient is willing to proceed. At patient's request, I also called [name] who is in agreement   Signed, Rollo FABIENE Louder, PA-C  10/31/2024 11:11 AM   "

## 2024-11-01 DIAGNOSIS — E039 Hypothyroidism, unspecified: Secondary | ICD-10-CM

## 2024-11-01 LAB — CBC WITH DIFFERENTIAL/PLATELET
Abs Immature Granulocytes: 0.19 K/uL — ABNORMAL HIGH (ref 0.00–0.07)
Basophils Absolute: 0 K/uL (ref 0.0–0.1)
Basophils Relative: 0 %
Eosinophils Absolute: 0 K/uL (ref 0.0–0.5)
Eosinophils Relative: 0 %
HCT: 28.3 % — ABNORMAL LOW (ref 36.0–46.0)
Hemoglobin: 9 g/dL — ABNORMAL LOW (ref 12.0–15.0)
Immature Granulocytes: 1 %
Lymphocytes Relative: 1 %
Lymphs Abs: 0.1 K/uL — ABNORMAL LOW (ref 0.7–4.0)
MCH: 27.1 pg (ref 26.0–34.0)
MCHC: 31.8 g/dL (ref 30.0–36.0)
MCV: 85.2 fL (ref 80.0–100.0)
Monocytes Absolute: 1.1 K/uL — ABNORMAL HIGH (ref 0.1–1.0)
Monocytes Relative: 5 %
Neutro Abs: 19.8 K/uL — ABNORMAL HIGH (ref 1.7–7.7)
Neutrophils Relative %: 93 %
Platelets: 249 K/uL (ref 150–400)
RBC: 3.32 MIL/uL — ABNORMAL LOW (ref 3.87–5.11)
RDW: 19.9 % — ABNORMAL HIGH (ref 11.5–15.5)
WBC: 21.3 K/uL — ABNORMAL HIGH (ref 4.0–10.5)
nRBC: 0.3 % — ABNORMAL HIGH (ref 0.0–0.2)

## 2024-11-01 LAB — CULTURE, BLOOD (ROUTINE X 2)

## 2024-11-01 LAB — MAGNESIUM: Magnesium: 2.1 mg/dL (ref 1.7–2.4)

## 2024-11-01 LAB — BASIC METABOLIC PANEL WITH GFR
Anion gap: 16 — ABNORMAL HIGH (ref 5–15)
BUN: 46 mg/dL — ABNORMAL HIGH (ref 8–23)
CO2: 15 mmol/L — ABNORMAL LOW (ref 22–32)
Calcium: 7.6 mg/dL — ABNORMAL LOW (ref 8.9–10.3)
Chloride: 111 mmol/L (ref 98–111)
Creatinine, Ser: 2.51 mg/dL — ABNORMAL HIGH (ref 0.44–1.00)
GFR, Estimated: 18 mL/min — ABNORMAL LOW
Glucose, Bld: 138 mg/dL — ABNORMAL HIGH (ref 70–99)
Potassium: 4.6 mmol/L (ref 3.5–5.1)
Sodium: 142 mmol/L (ref 135–145)

## 2024-11-01 LAB — GLUCOSE, CAPILLARY
Glucose-Capillary: 114 mg/dL — ABNORMAL HIGH (ref 70–99)
Glucose-Capillary: 120 mg/dL — ABNORMAL HIGH (ref 70–99)
Glucose-Capillary: 137 mg/dL — ABNORMAL HIGH (ref 70–99)
Glucose-Capillary: 142 mg/dL — ABNORMAL HIGH (ref 70–99)
Glucose-Capillary: 151 mg/dL — ABNORMAL HIGH (ref 70–99)
Glucose-Capillary: 167 mg/dL — ABNORMAL HIGH (ref 70–99)
Glucose-Capillary: 177 mg/dL — ABNORMAL HIGH (ref 70–99)

## 2024-11-01 LAB — LACTIC ACID, PLASMA: Lactic Acid, Venous: 1.6 mmol/L (ref 0.5–1.9)

## 2024-11-01 LAB — PHOSPHORUS: Phosphorus: 5.1 mg/dL — ABNORMAL HIGH (ref 2.5–4.6)

## 2024-11-01 MED ORDER — HYDROCORTISONE SOD SUC (PF) 100 MG IJ SOLR
100.0000 mg | Freq: Two times a day (BID) | INTRAMUSCULAR | Status: DC
  Administered 2024-11-01 – 2024-11-02 (×2): 100 mg via INTRAVENOUS
  Filled 2024-11-01 (×2): qty 2

## 2024-11-01 NOTE — Evaluation (Signed)
 Occupational Therapy Evaluation Patient Details Name: Jody Shaw MRN: [id number] DOB: 09-03-1941 Today's Date: 11/01/2024   History of Present Illness   Pt is an 83 year old woman admitted on 10/29/24 with AMS and MSSA bacteremia, + sepsis. PMH: giant cell arteritis, hypothyroidism, CAD, GERD, lymphedema, HTN, HLD and anxiety.     Clinical Impressions Pt lives alone with the support of her daughter and a neighbor for IADLs. She sleeps on the first floor of her home in a recliner and uses a BSC due to her bathroom being inaccessible to her rollator. Pt sponge bathes and can prepare simple meals. Pt presents with generalized weakness, impaired standing balance, decreased endurance and impaired cognition. She requires +2 mod assist to EOB and min +2 assist to stand and step pivot to chair with RW. Pt needs min to total assist for ADLs. Patient will benefit from continued inpatient follow up therapy, <3 hours/day.     If plan is discharge home, recommend the following:   Two people to help with walking and/or transfers;Two people to help with bathing/dressing/bathroom;Assistance with cooking/housework;Direct supervision/assist for medications management;Direct supervision/assist for financial management;Assist for transportation;Help with stairs or ramp for entrance     Functional Status Assessment   Patient has had a recent decline in their functional status and demonstrates the ability to make significant improvements in function in a reasonable and predictable amount of time.     Equipment Recommendations   Other (comment) (defer)     Recommendations for Other Services         Precautions/Restrictions   Precautions Precautions: Fall     Mobility Bed Mobility Overal bed mobility: Needs Assistance Bed Mobility: Sit to Supine       Sit to supine: +2 for physical assistance, Mod assist   General bed mobility comments: assist to raise trunk and to initiate hips  to EOB    Transfers Overall transfer level: Needs assistance Equipment used: Rolling walker (2 wheels) Transfers: Sit to/from Stand, Bed to chair/wheelchair/BSC Sit to Stand: Min assist, +2 safety/equipment     Step pivot transfers: Min assist, +2 safety/equipment     General transfer comment: assist to rise and steady, increased time, multimodal cues for hand placement      Balance Overall balance assessment: Needs assistance   Sitting balance-Leahy Scale: Fair       Standing balance-Leahy Scale: Poor                             ADL either performed or assessed with clinical judgement   ADL Overall ADL's : Needs assistance/impaired Eating/Feeding: Set up;Sitting Eating/Feeding Details (indicate cue type and reason): assist to open containers Grooming: Set up;Sitting   Upper Body Bathing: Moderate assistance;Sitting   Lower Body Bathing: Total assistance;Sit to/from stand;+2 for safety/equipment   Upper Body Dressing : Moderate assistance;Sitting   Lower Body Dressing: Total assistance;Bed level   Toilet Transfer: Minimal assistance;+2 for safety/equipment;Rolling walker (2 wheels) Toilet Transfer Details (indicate cue type and reason): simulated bed to chair Toileting- Clothing Manipulation and Hygiene: Total assistance;+2 for physical assistance;Sit to/from stand               Vision Baseline Vision/History: 1 Wears glasses Ability to See in Adequate Light: 0 Adequate Patient Visual Report: No change from baseline       Perception         Praxis         Pertinent Vitals/Pain Pain  Assessment Pain Assessment: Faces Faces Pain Scale: Hurts a little bit Pain Location: B lower legs with touch Pain Descriptors / Indicators: Grimacing, Guarding, Moaning Pain Intervention(s): Monitored during session, Repositioned     Extremity/Trunk Assessment Upper Extremity Assessment Upper Extremity Assessment: Generalized weakness;Right hand  dominant   Lower Extremity Assessment Lower Extremity Assessment: Defer to PT evaluation   Cervical / Trunk Assessment Cervical / Trunk Assessment: Other exceptions (weakness, obesity)   Communication Communication Communication: Impaired Factors Affecting Communication: Hearing impaired   Cognition Arousal: Alert Behavior During Therapy: WFL for tasks assessed/performed Cognition: Cognition impaired     Awareness: Intellectual awareness intact, Online awareness impaired Memory impairment (select all impairments): Short-term memory Attention impairment (select first level of impairment): Sustained attention Executive functioning impairment (select all impairments): Sequencing, Problem solving                   Following commands: Impaired Following commands impaired: Follows one step commands with increased time     Cueing  General Comments   Cueing Techniques: Verbal cues      Exercises     Shoulder Instructions      Home Living Family/patient expects to be discharged to:: Private residence Living Arrangements: Alone Available Help at Discharge: Family;Available PRN/intermittently Type of Home: House Home Access: Ramped entrance     Home Layout: Two level;1/2 bath on main level Alternate Level Stairs-Number of Steps: sleeps in recliner in living room   Bathroom Shower/Tub: Sponge bathes at baseline   Bathroom Toilet:  (used BSC) Bathroom Accessibility: No   Home Equipment: Rollator (4 wheels);BSC/3in1;Wheelchair - manual;Lift chair (lift chair is broken)   Additional Comments: bathroom not accessible to rollator      Prior Functioning/Environment Prior Level of Function : Needs assist             Mobility Comments: She used a rollator inside the home and manual wheelchair for community mobility. ADLs Comments: neighbor helps with mail, daughter helps with IADLs, pt can prepare a simple meal, sponge bathes, uses BSC beside her recliner     OT Problem List: Decreased strength;Decreased activity tolerance;Impaired balance (sitting and/or standing);Decreased cognition;Decreased safety awareness;Decreased knowledge of use of DME or AE;Pain;Obesity   OT Treatment/Interventions: Self-care/ADL training;DME and/or AE instruction;Therapeutic activities;Cognitive remediation/compensation;Patient/family education;Balance training      OT Goals(Current goals can be found in the care plan section)   Acute Rehab OT Goals OT Goal Formulation: With patient Time For Goal Achievement: 11/15/24 Potential to Achieve Goals: Fair ADL Goals Pt Will Perform Grooming: with min assist;standing (one activity) Pt Will Perform Upper Body Bathing: with min assist;sitting Pt Will Perform Upper Body Dressing: with supervision;with adaptive equipment Pt Will Transfer to Toilet: with contact guard assist;ambulating;bedside commode Pt Will Perform Toileting - Clothing Manipulation and hygiene: with min assist;sit to/from stand   OT Frequency:  Min 2X/week    Co-evaluation PT/OT/SLP Co-Evaluation/Treatment: Yes Reason for Co-Treatment: Complexity of the patient's impairments (multi-system involvement);For patient/therapist safety   OT goals addressed during session: ADL's and self-care      AM-PAC OT 6 Clicks Daily Activity     Outcome Measure Help from another person eating meals?: A Little Help from another person taking care of personal grooming?: A Little Help from another person toileting, which includes using toliet, bedpan, or urinal?: Total Help from another person bathing (including washing, rinsing, drying)?: A Lot Help from another person to put on and taking off regular upper body clothing?: A Lot Help from another person to put  on and taking off regular lower body clothing?: Total 6 Click Score: 12   End of Session Equipment Utilized During Treatment: Gait belt;Rolling walker (2 wheels) Nurse Communication: Mobility  status  Activity Tolerance: Patient tolerated treatment well Patient left: in chair;with call bell/phone within reach;with chair alarm set;with nursing/sitter in room  OT Visit Diagnosis: Unsteadiness on feet (R26.81);Other abnormalities of gait and mobility (R26.89);Muscle weakness (generalized) (M62.81);Pain;Other symptoms and signs involving cognitive function                Time: 8567-8494 OT Time Calculation (min): 33 min Charges:  OT General Charges $OT Visit: 1 Visit OT Evaluation $OT Eval Moderate Complexity: 1 Mod  Mliss HERO, OTR/L Acute Rehabilitation Services Office: [phone]  Kennth Mliss Helling 11/01/2024, 4:08 PM

## 2024-11-01 NOTE — Plan of Care (Signed)
   Problem: Clinical Measurements: Goal: Ability to maintain clinical measurements within normal limits will improve Outcome: Progressing   Problem: Activity: Goal: Risk for activity intolerance will decrease Outcome: Progressing   Problem: Elimination: Goal: Will not experience complications related to urinary retention Outcome: Progressing   Problem: Pain Managment: Goal: General experience of comfort will improve and/or be controlled Outcome: Progressing

## 2024-11-01 NOTE — Evaluation (Signed)
 Physical Therapy Evaluation Patient Details Name: Jody Shaw MRN: [id number] DOB: December 27, 1940 Today's Date: 11/01/2024  History of Present Illness  Pt is an 83 year old woman admitted on 10/29/24 with AMS and MSSA bacteremia, + sepsis. PMH: giant cell arteritis, hypothyroidism, CAD, GERD, lymphedema, HTN, HLD and anxiety.  Clinical Impression  Pt lives alone with the support of her daughter and a neighbor for IADLs. She sleeps on the first floor of her home in a recliner and uses a BSC due to her bathroom being inaccessible to her rollator. Pt sponge bathes and can prepare simple meals. Pt presents with generalized weakness, impaired standing balance, decreased endurance and impaired cognition. She requires +2 mod assist to EOB and min +2 assist to stand and step pivot to chair with RW.  Patient will benefit from continued inpatient follow up therapy, <3 hours/day to maximize IND and safety prior to return home.      If plan is discharge home, recommend the following: Two people to help with walking and/or transfers;A lot of help with bathing/dressing/bathroom;Assistance with cooking/housework;Assist for transportation;Help with stairs or ramp for entrance   Can travel by private vehicle   No    Equipment Recommendations None recommended by PT  Recommendations for Other Services       Functional Status Assessment Patient has had a recent decline in their functional status and demonstrates the ability to make significant improvements in function in a reasonable and predictable amount of time.     Precautions / Restrictions Precautions Precautions: Fall Restrictions Weight Bearing Restrictions Per Provider Order: No      Mobility  Bed Mobility Overal bed mobility: Needs Assistance Bed Mobility: Supine to Sit     Supine to sit: Mod assist, +2 for physical assistance, +2 for safety/equipment, Used rails     General bed mobility comments: assist to raise trunk and to  initiate hips to EOB    Transfers Overall transfer level: Needs assistance Equipment used: Rolling walker (2 wheels) Transfers: Sit to/from Stand, Bed to chair/wheelchair/BSC Sit to Stand: Min assist, Mod assist, +2 physical assistance, +2 safety/equipment, From elevated surface   Step pivot transfers: Min assist, +2 safety/equipment       General transfer comment: assist to rise and steady, increased time, multimodal cues for hand placement    Ambulation/Gait               General Gait Details: step pvt to chair only - pt unsteady and with potential for knees to buckle  Stairs            Wheelchair Mobility     Tilt Bed    Modified Rankin (Stroke Patients Only)       Balance Overall balance assessment: Needs assistance Sitting-balance support: No upper extremity supported, Feet supported Sitting balance-Leahy Scale: Fair     Standing balance support: Bilateral upper extremity supported Standing balance-Leahy Scale: Poor                               Pertinent Vitals/Pain Pain Assessment Pain Assessment: Faces Faces Pain Scale: Hurts a little bit Pain Location: B lower legs with touch Pain Descriptors / Indicators: Grimacing, Guarding, Moaning Pain Intervention(s): Limited activity within patient's tolerance, Monitored during session, Repositioned    Home Living Family/patient expects to be discharged to:: Private residence Living Arrangements: Alone Available Help at Discharge: Family;Available PRN/intermittently Type of Home: House Home Access: Ramped entrance  Alternate Level Stairs-Number of Steps: sleeps in recliner in living room Home Layout: Two level;1/2 bath on main level Home Equipment: Rollator (4 wheels);BSC/3in1;Wheelchair - manual;Lift chair (lift chair is broken) Additional Comments: bathroom not accessible to rollator    Prior Function Prior Level of Function : Needs assist             Mobility Comments:  She used a rollator inside the home and manual wheelchair for community mobility. ADLs Comments: neighbor helps with mail, daughter helps with IADLs, pt can prepare a simple meal, sponge bathes, uses BSC beside her recliner     Extremity/Trunk Assessment   Upper Extremity Assessment Upper Extremity Assessment: Generalized weakness    Lower Extremity Assessment Lower Extremity Assessment: Generalized weakness (Dressings in place bil lower legs)    Cervical / Trunk Assessment Cervical / Trunk Assessment: Other exceptions (obesity)  Communication   Communication Communication: Impaired Factors Affecting Communication: Hearing impaired    Cognition Arousal: Alert Behavior During Therapy: WFL for tasks assessed/performed                             Following commands: Impaired Following commands impaired: Follows one step commands with increased time     Cueing Cueing Techniques: Verbal cues     General Comments      Exercises     Assessment/Plan    PT Assessment Patient needs continued PT services  PT Problem List Decreased strength;Decreased activity tolerance;Decreased balance;Decreased mobility;Decreased knowledge of use of DME;Obesity       PT Treatment Interventions DME instruction;Gait training;Functional mobility training;Therapeutic activities;Therapeutic exercise;Patient/family education;Balance training    PT Goals (Current goals can be found in the Care Plan section)  Acute Rehab PT Goals Patient Stated Goal: REgain strength to have CHristmas with family PT Goal Formulation: With patient Time For Goal Achievement: 11/14/24 Potential to Achieve Goals: Good    Frequency Min 2X/week     Co-evaluation PT/OT/SLP Co-Evaluation/Treatment: Yes Reason for Co-Treatment: Complexity of the patient's impairments (multi-system involvement);For patient/therapist safety PT goals addressed during session: Mobility/safety with mobility OT goals addressed  during session: ADL's and self-care       AM-PAC PT 6 Clicks Mobility  Outcome Measure Help needed turning from your back to your side while in a flat bed without using bedrails?: A Lot Help needed moving from lying on your back to sitting on the side of a flat bed without using bedrails?: A Lot Help needed moving to and from a bed to a chair (including a wheelchair)?: A Lot Help needed standing up from a chair using your arms (e.g., wheelchair or bedside chair)?: A Lot Help needed to walk in hospital room?: Total Help needed climbing 3-5 steps with a railing? : Total 6 Click Score: 10    End of Session Equipment Utilized During Treatment: Gait belt Activity Tolerance: Patient tolerated treatment well;Patient limited by fatigue Patient left: in chair;with call bell/phone within reach;with chair alarm set;with nursing/sitter in room Nurse Communication: Mobility status PT Visit Diagnosis: Difficulty in walking, not elsewhere classified (R26.2);Muscle weakness (generalized) (M62.81)    Time: 8565-8496 PT Time Calculation (min) (ACUTE ONLY): 29 min   Charges:   PT Evaluation $PT Eval Moderate Complexity: 1 Mod   PT General Charges $$ ACUTE PT VISIT: 1 Visit         [facility] PT Acute Rehabilitation Services Office [phone]   Caleigha Zale 11/01/2024, 4:49 PM

## 2024-11-01 NOTE — Progress Notes (Addendum)
 "  NAME:  Jody Shaw, MRN:  [id number], DOB:  1941-04-03, LOS: 3 ADMISSION DATE:  10/29/2024,   History of Present Illness:  Patient is a 83 yo F w/ pertinent PMH giant cell arteritis, hypothyroidism, CAD, GERD, lymphedema, HTN, HLD and anxiety presents to [facility] ED on 12/17 for pna and elevated creat.   On 12/17 patient sent by PCP to [facility] Ed for eval of pna and elevated creat. On arrival hypothermic and creat 2.76. wbc 13.3 and la 1.6. CXR unremarkable. Covid/flu/rsv and RVP negative. CT chest no acute abnormality; moderate hiatal hernia. UA w/ small leukocytes. Cultures obtained, given fluids, and started on rocephin /azithro. Strep pna negative and legionella pending. Cultures came back w/ MSSA. ID consulted and abx changed to ancef . Echo pending. On 12/19 bp soft getting fluids and midodrine . Added stress dose steroids. Pccm consulted.     Pertinent  Medical History   Past Medical History:  Diagnosis Date   Coronary artery disease    s/p rotational atherectomy to the LAD in 1996 // Nuc 9/13: no ischemia   Echocardiogram    Echo 9/19:  EF 55-60, normal wall motion, grade 1 diastolic dysfunction, MAC   GERD (gastroesophageal reflux disease)    Giant cell arteritis (HCC)    HLD (hyperlipidemia)    Hypothyroidism    Vision loss of left eye      Significant Hospital Events: Including procedures, antibiotic start and stop dates in addition to other pertinent events   12/17 admit 12/19 hypotension despite fluids; pccm consulted MSSA bacteremia on levophed    Interim History / Subjective:  Patient reported she is feeling well. She tells me she has left side eye blidness and the Righ eye has been blurry 4 weeks ago on and off. She has not see an ophthalmologist yet.  She is eating comfortable, at RA.   Objective    Blood pressure (!) 158/65, pulse 69, temperature (!) 97.5 F (36.4 C), temperature source Oral, resp. rate 16, weight 97 kg, SpO2 98%.        Intake/Output Summary  (Last 24 hours) at 11/01/2024 1337 Last data filed at 11/01/2024 0710 Gross per 24 hour  Intake 1368.92 ml  Output 315 ml  Net 1053.92 ml   Filed Weights   11/01/24 0500  Weight: 97 kg    Examination: Physical Exam Constitutional:      Appearance: Normal appearance.  HENT:     Head: Normocephalic.     Mouth/Throat:     Mouth: Mucous membranes are moist.  Cardiovascular:     Rate and Rhythm: Normal rate and regular rhythm.  Pulmonary:     Effort: Pulmonary effort is normal.     Breath sounds: Normal breath sounds.     Comments: No wheezing Abdominal:     General: Bowel sounds are normal.     Palpations: Abdomen is soft.  Musculoskeletal:     Comments: Move 4 extremities, Les with dressing   Skin:    General: Skin is warm.  Neurological:     General: No focal deficit present.     Mental Status: She is alert and oriented to person, place, and time.    Resolved problem list   Assessment and Plan   Septic shock MSSA bacteremia Plan: -map goal >65; on levo 4 -x2 blood cx with MSSA > on cefazolin   -TEE next week, echo without vegetation- normal EF 60%. Normal RV fx and size. Mod MS  AKI on CKD 3b Lactic acidosis resolved  Metabolic  acidosis Anemia of CKD Plan: -Finished  bicarb drip -Trend BMP / urinary output - 0.51ml/kg/h -Trend CBC, interestingly her wbc is 20 today. No fever. -Replace electrolytes as indicated -Avoid nephrotoxic agents, ensure adequate renal perfusion   Suspected underlying COPD with likely mild exacerbation: -Outpatient PFTs.   -Initiated on anoro Ellipta . -On stress dose steroids. -DuoNebs as needed.   Chronic Lymphedema Plan: -woc following; continue dressing wrap -pt/ot -f/u outpt vascular   Anxiety Plan: -sertraline    Hx of giant cell arteritis -on chronic prednisone  5 mg daily  Plan: -stress dose steroids 100 gm BID for 5-7 days until shock resolved.   Hypothyroidism Plan: -tsh normal -synthroid     CAD HTN HLD Plan: -asa and statin -hold anti-htn meds  GI -gi panel pending; cdiff negative   GOC: patient is currently listed as DNR/DNI; palliative care following  Best Practice (right click and Reselect all SmartList Selections daily)   Diet/type: Regular consistency (see orders) DVT prophylaxis prophylactic heparin   Pressure ulcer(s): N/A GI prophylaxis: N/A Lines: N/A PIV Foley:  Yes, and it is still needed Code Status:  DNR Last date of multidisciplinary goals of care discussion [yesterday]  Labs   CBC: Recent Labs  Lab 10/29/24 1441 10/30/24 0709 10/30/24 1525 10/31/24 0311 11/01/24 0250  WBC 13.3* 12.2*  --  10.8* 21.3*  NEUTROABS 11.1*  --   --  9.8* 19.8*  HGB 8.9* 6.4* 9.0* 9.4* 9.0*  HCT 29.8* 20.7* 28.5* 30.3* 28.3*  MCV 86.4 85.2  --  86.3 85.2  PLT 264 213  --  201 249    Basic Metabolic Panel: Recent Labs  Lab 10/29/24 1441 10/30/24 0546 10/31/24 0311 11/01/24 0250  NA 139 140 142 142  K 5.1 4.6 4.9 4.6  CL 107 111 113* 111  CO2 15* 15* 14* 15*  GLUCOSE 113* 80 77 138*  BUN 61* 49* 48* 46*  CREATININE 2.76* 2.36* 2.43* 2.51*  CALCIUM  8.5* 7.3* 7.5* 7.6*  MG  --   --  1.8 2.1  PHOS  --   --   --  5.1*   GFR: CrCl cannot be calculated (Unknown ideal weight.). Recent Labs  Lab 10/29/24 1441 10/29/24 1512 10/30/24 0302 10/30/24 0546 10/30/24 0709 10/31/24 0311 10/31/24 1020 11/01/24 0250  PROCALCITON  --   --   --  0.28  --   --   --   --   WBC 13.3*  --   --   --  12.2* 10.8*  --  21.3*  LATICACIDVEN  --    < > 3.5* 2.9*  --   --  2.0* 1.6   < > = values in this interval not displayed.    Liver Function Tests: Recent Labs  Lab 10/29/24 1441 10/31/24 0311  AST 20 23  ALT 24 15  ALKPHOS 124 99  BILITOT 0.2 0.2  PROT 7.0 5.2*  ALBUMIN  3.2* 2.4*   No results for input(s): LIPASE, AMYLASE in the last 168 hours. No results for input(s): AMMONIA in the last 168 hours.  ABG    Component Value Date/Time    HCO3 16.5 (L) 10/31/2024 1222   ACIDBASEDEF 9.6 (H) 10/31/2024 1222   O2SAT 86.4 10/31/2024 1222     Coagulation Profile: Recent Labs  Lab 10/29/24 1441  INR 1.2    Cardiac Enzymes: No results for input(s): CKTOTAL, CKMB, CKMBINDEX, TROPONINI in the last 168 hours.  HbA1C: No results found for: HGBA1C  CBG: Recent Labs  Lab 10/31/24 2003 11/01/24 0019  11/01/24 0406 11/01/24 0801 11/01/24 1112  GLUCAP 133* 151* 142* 137* 177*    Review of Systems:   As above  Past Medical History:  She,  has a past medical history of Coronary artery disease, Echocardiogram, GERD (gastroesophageal reflux disease), Giant cell arteritis (HCC), HLD (hyperlipidemia), Hypothyroidism, and Vision loss of left eye.   Surgical History:   Past Surgical History:  Procedure Laterality Date   ARTERY BIOPSY Left 08/03/2017   Procedure: BIOPSY TEMPORAL ARTERY;  Surgeon: Stevie, Herlene Righter, MD;  Location: [facility]S;  Service: General;  Laterality: Left;     Social History:   reports that she has quit smoking. She has never used smokeless tobacco. She reports that she does not drink alcohol  and does not use drugs.   Family History:  Her family history includes Heart disease in her father and mother.   Allergies Allergies[1]   Home Medications  Prior to Admission medications  Medication Sig Start Date End Date Taking? Authorizing Provider  aspirin  EC 81 MG tablet Take 81 mg by mouth daily. Swallow whole.   Yes [provider]  atenolol  (TENORMIN ) 25 MG tablet TAKE 1 TABLET BY MOUTH EVERY DAY. Patient taking differently: Take 25 mg by mouth in the morning. 11/09/23  Yes Jerilynn Lamarr HERO, NP  calcium  carbonate (TUMS - DOSED IN MG ELEMENTAL CALCIUM ) 500 MG chewable tablet Chew 1 tablet by mouth 2 (two) times daily as needed for indigestion or heartburn.   Yes [provider]  Dextromethorphan-guaiFENesin  (GOODSENSE TUSSIN DM) 20-200 MG/20ML LIQD Take 2.5 mLs by mouth  daily as needed (for coughing).   Yes [provider]  ibuprofen (ADVIL) 200 MG tablet Take 400 mg by mouth daily as needed for mild pain (pain score 1-3), headache or fever.   Yes [provider]  isosorbide  mononitrate (IMDUR ) 60 MG 24 hr tablet Take 30 mg by mouth every evening.   Yes [provider]  levothyroxine  (SYNTHROID , LEVOTHROID) 88 MCG tablet Take 88 mcg by mouth daily before breakfast.   Yes [provider]  Multiple Vitamin (MULTI VITAMIN DAILY) TABS Take 1 tablet by mouth daily with breakfast.   Yes [provider]  nitroGLYCERIN  (NITROSTAT ) 0.4 MG SL tablet Place 1 tablet (0.4 mg total) under the tongue every 5 (five) minutes as needed for chest pain. 10/19/22  Yes Conte, Tessa N, PA-C  potassium chloride  (KLOR-CON ) 10 MEQ tablet Take 10 mEq by mouth daily.   Yes [provider]  predniSONE  (DELTASONE ) 5 MG tablet Take 5 mg by mouth daily with breakfast.   Yes [provider]  rosuvastatin  (CRESTOR ) 10 MG tablet Take 1 tablet by mouth 2 times a week. Patient taking differently: Take 10 mg by mouth See admin instructions. Take 10 mg by mouth at bedtime on Sundays and Wednesdays 11/09/23  Yes Jerilynn Lamarr HERO, NP  sertraline  (ZOLOFT ) 25 MG tablet Take 25 mg by mouth daily. 02/15/24  Yes [provider]  sodium chloride  (MURO 128) 5 % ophthalmic solution Place 1 drop into the right eye every 4 (four) hours as needed for eye irritation.   Yes [provider]  SYSTANE HYDRATION PF 0.4-0.3 % SOLN Place 1 drop into both eyes 3 (three) times daily as needed (for dryness).   Yes [provider]  triamcinolone cream (KENALOG) 0.1 % Apply 1 Application topically every 2 (two) hours as needed (for itching).   Yes [provider]  isosorbide  mononitrate (IMDUR ) 30 MG 24 hr tablet TAKE 1  TABLET BY MOUTH EVERY EVENING Patient not taking: Reported on 10/29/2024 03/11/24   [name], [name], DO  potassium  chloride SA (K-DUR) 20 MEQ tablet Take 1 tablet (20 mEq total) by mouth daily. Patient not taking: Reported on 10/29/2024 05/09/19   Claudene Victory ORN, MD       The patient is critically ill due to septic shock with MSSA.  Critical care was necessary to treat or prevent imminent or life-threatening deterioration. Critical care time was spent by me on the following activities: development of a treatment plan with the patient and/or surrogate as well as nursing, discussions with consultants, evaluation of the patient's response to treatment, examination of the patient, obtaining a history from the patient or surrogate, ordering and performing treatments and interventions, ordering and review of laboratory studies, ordering and review of radiographic studies, review of telemetry data including pulse oximetry, re-evaluation of patient's condition and participation in multidisciplinary rounds.   I personally spent 35 minutes providing critical care not including any separately billable procedures.  Marny Patch, MD [facility] 11/01/2024 1:37 PM                [1]  Allergies Allergen Reactions   Ace Inhibitors Other (See Comments)    Severe AKI from dehydration and ARB   Angiotensin Receptor Blockers Other (See Comments)    Severe AKI from dehydration and ARB   Tetanus Toxoid, Adsorbed Swelling and Other (See Comments)    Arm became swollen   Watermelon [Citrullus Vulgaris] Swelling and Other (See Comments)    Tongue became swollen   Iodine Rash   Sulfa Antibiotics Rash   "

## 2024-11-02 ENCOUNTER — Inpatient Hospital Stay (HOSPITAL_COMMUNITY)

## 2024-11-02 DIAGNOSIS — Z7189 Other specified counseling: Secondary | ICD-10-CM

## 2024-11-02 LAB — BASIC METABOLIC PANEL WITH GFR
Anion gap: 15 (ref 5–15)
BUN: 49 mg/dL — ABNORMAL HIGH (ref 8–23)
CO2: 15 mmol/L — ABNORMAL LOW (ref 22–32)
Calcium: 7.9 mg/dL — ABNORMAL LOW (ref 8.9–10.3)
Chloride: 114 mmol/L — ABNORMAL HIGH (ref 98–111)
Creatinine, Ser: 2.78 mg/dL — ABNORMAL HIGH (ref 0.44–1.00)
GFR, Estimated: 16 mL/min — ABNORMAL LOW
Glucose, Bld: 125 mg/dL — ABNORMAL HIGH (ref 70–99)
Potassium: 4.1 mmol/L (ref 3.5–5.1)
Sodium: 144 mmol/L (ref 135–145)

## 2024-11-02 LAB — GLUCOSE, CAPILLARY
Glucose-Capillary: 122 mg/dL — ABNORMAL HIGH (ref 70–99)
Glucose-Capillary: 121 mg/dL — ABNORMAL HIGH (ref 70–99)
Glucose-Capillary: 123 mg/dL — ABNORMAL HIGH (ref 70–99)
Glucose-Capillary: 134 mg/dL — ABNORMAL HIGH (ref 70–99)

## 2024-11-02 LAB — CBC WITH DIFFERENTIAL/PLATELET
Abs Immature Granulocytes: 0.28 K/uL — ABNORMAL HIGH (ref 0.00–0.07)
Basophils Absolute: 0 K/uL (ref 0.0–0.1)
Basophils Relative: 0 %
Eosinophils Absolute: 0 K/uL (ref 0.0–0.5)
Eosinophils Relative: 0 %
HCT: 27.9 % — ABNORMAL LOW (ref 36.0–46.0)
Hemoglobin: 8.7 g/dL — ABNORMAL LOW (ref 12.0–15.0)
Immature Granulocytes: 2 %
Lymphocytes Relative: 1 %
Lymphs Abs: 0.3 K/uL — ABNORMAL LOW (ref 0.7–4.0)
MCH: 26.7 pg (ref 26.0–34.0)
MCHC: 31.2 g/dL (ref 30.0–36.0)
MCV: 85.6 fL (ref 80.0–100.0)
Monocytes Absolute: 0.9 K/uL (ref 0.1–1.0)
Monocytes Relative: 5 %
Neutro Abs: 16 K/uL — ABNORMAL HIGH (ref 1.7–7.7)
Neutrophils Relative %: 92 %
Platelets: 206 K/uL (ref 150–400)
RBC: 3.26 MIL/uL — ABNORMAL LOW (ref 3.87–5.11)
RDW: 20.3 % — ABNORMAL HIGH (ref 11.5–15.5)
WBC: 17.4 K/uL — ABNORMAL HIGH (ref 4.0–10.5)
nRBC: 0.1 % (ref 0.0–0.2)

## 2024-11-02 LAB — LEGIONELLA PNEUMOPHILA SEROGP 1 UR AG: L. pneumophila Serogp 1 Ur Ag: NEGATIVE

## 2024-11-02 LAB — MAGNESIUM: Magnesium: 2.1 mg/dL (ref 1.7–2.4)

## 2024-11-02 LAB — PHOSPHORUS: Phosphorus: 4.6 mg/dL (ref 2.5–4.6)

## 2024-11-02 MED ORDER — HYDROCORTISONE SOD SUC (PF) 100 MG IJ SOLR
50.0000 mg | Freq: Two times a day (BID) | INTRAMUSCULAR | Status: AC
  Administered 2024-11-03 (×2): 50 mg via INTRAVENOUS
  Filled 2024-11-02 (×2): qty 2

## 2024-11-02 MED ORDER — HYDROCORTISONE SOD SUC (PF) 100 MG IJ SOLR
50.0000 mg | Freq: Every day | INTRAMUSCULAR | Status: AC
  Administered 2024-11-04: 50 mg via INTRAVENOUS
  Filled 2024-11-02: qty 2

## 2024-11-02 MED ORDER — NYSTATIN 100000 UNIT/ML MT SUSP
5.0000 mL | Freq: Four times a day (QID) | OROMUCOSAL | Status: DC
  Administered 2024-11-02 – 2024-11-08 (×19): 500000 [IU] via ORAL
  Filled 2024-11-02 (×18): qty 5

## 2024-11-02 MED ORDER — MELATONIN 3 MG PO TABS
3.0000 mg | ORAL_TABLET | Freq: Every day | ORAL | Status: DC
  Administered 2024-11-02 – 2024-11-05 (×4): 3 mg via ORAL
  Filled 2024-11-02 (×5): qty 1

## 2024-11-02 MED ORDER — FAMOTIDINE 20 MG PO TABS
20.0000 mg | ORAL_TABLET | Freq: Every day | ORAL | Status: DC
  Administered 2024-11-03 – 2024-11-08 (×6): 20 mg via ORAL
  Filled 2024-11-02 (×6): qty 1

## 2024-11-02 NOTE — Progress Notes (Signed)
 " Daily Progress Note   Patient Name: Jody Shaw       Date: 11/02/2024 DOB: 1941/10/26  Age: 83 y.o. MRN#: [id number] Attending Physician: Adrien Guan, Tamala* Primary Care Physician: Nichole Senior, MD Admit Date: 10/29/2024 Length of Stay: 4 days  Reason for Consultation/Follow-up: Establishing goals of care  Subjective:   Reviewed EMR including recent documentation from PCCM and PT/OT.  Reviewed recent BMP noting patient's BUN elevated at 49, creatinine elevated at 2.78, and GFR low at 16.  Reviewed recent CBC noting patient's WBCs trending down to 17.4.  Reviewed head CT obtained today due to concerns for anisocoria.  Head CT did not show any acute intracranial abnormalities; changes consistent with chronic microvascular ischemic changes.  Discussed care with bedside RN and PCCM provider for medical updates.  Patient not currently on pressor support.  Updated plan is for TEE tomorrow if medically appropriate.  Presented to bedside to see patient.  Patient lying comfortably in bed.  No visitors present at bedside.  Attempted to interact with patient while she was pleasant, was very confused.  Patient was saying that she was being scammed and that she needed a quarter to make a call about it.  Noted would reach out to her daughter to discuss care which she did agree with.  Called patient's daughter, Jody Shaw.  Introduced myself as a member of the palliative medicine team.  Provided medical updates regarding patient's care at this time.  Spent time explaining delirium and how that is affected by patient's hospitalization and acute medical illness.  Discussed continuing appropriate medical management at this time.  Did express concern about patient's worsening kidney function.  Discussed with her having acute infection, kidneys may have been damaged with hypotension though allowing time for outcomes to determine if they will recover.  Also discussed plan for TEE tomorrow if medically  appropriate.  Spent time explaining about TEE and that workup was to make sure patient did not have infection in her heart with her diagnosis of MSSA bacteremia.  Patient continuing with appropriate antibiotics at this time.  Discussed if TEE did find a buildup of bacteria in heart, would need to discuss medical care and what interventions would best support patient's quality of life moving forward.  Daughter did inquire what would happen if patient were to medically deteriorate.  Discussed at that time may need to consider comfort measures and involving hospice support.  Noted at this time taking things day by day and plan to continue discussions with daughter moving forward to best support patient's care.  All questions answered at that time.  Daughter voiced appreciation for conversation.  Noted palliative medicine team will continue to follow along with patient's medical journey.  Updated RN regarding conversation with daughter.  Objective:   Vital Signs:  BP (!) 102/54   Pulse 75   Temp (!) 95.8 F (35.4 C) (Axillary) Comment: blankets applied  Resp 16   Wt 97 kg   SpO2 96%   BMI 39.11 kg/m   Physical Exam: General: NAD, chronically ill-appearing, pleasantly confused Cardiovascular: RRR Respiratory: no increased work of breathing noted, not in respiratory distress Abdomen: not distended Neuro: Pleasantly confused, awake  Assessment & Plan:   Assessment: Patient is an 83 year old female with a past medical history of CAD, lymphedema, CKD stage IIIb, anemia, GERD, hypothyroidism, giant cell arteritis, hypothyroidism, HTN, and HLD who was sent to the ED by her PCP on 10/29/2024 for evaluation of possible pneumonia and elevated creatinine.  Chest  x-ray at PCPs office showed pneumonia however repeat chest x-ray and CT chest on admission with no acute abnormalities. Patient is admitted with severe sepsis secondary to UTI, concern for pneumonia, lactic acidosis, and AKI on CKD. Palliative  Medicine has been consulted for goals of care discussions and complex medical decision making.   Recommendations/Plan: # Complex medical decision making/goals of care:  - Patient awake today though pleasantly confused though unable to participate in complex medical decision making.  - Discussed care with patient's daughter, Jody Shaw, as detailed above in HPI.  Provided medical updates.  Did express concern about patient's worsening renal function though at this time continuing appropriate medical management to allow time for outcomes.  Planning for TEE workup with underlying MSSA bacteremia.  Discussed with daughter importance of continued conversations to best support patient's quality of life moving forward.  Did introduce idea that if patient is not improving as medically hoped, may need to consider what medical interventions would be pursued and if would want to focus on comfort with hospice.  Palliative medicine team continuing to follow along with patient's medical journey.  -  Code Status: Limited: Do not attempt resuscitation (DNR) -DNR-LIMITED -Do Not Intubate/DNI   #Symptom management Non-pharmacologic Delirium Precautions  - Frequent re-orientation; update board w/ date, names of treatment team  - Daytime stimulation: Open curtains, turn lights on, and turn on television as tolerated during waking hours  - Nighttime calm - close curtains, turn lights off, and turn off television at 9pm  with minimal interruptions from treatment team during sleeping hours, including avoiding lab draws if possible  - Encourage continued presence of family/friends  - Occupy w/ distractions - e.g. ask them to fold washcloths, busy-board  - Encourage movement with PT/OT as tolerated  - Ensure adequate sensorium, to include providing glasses, dentures, and hearing aids to patient as appropriate  - Ensure adequate nutrition and fluid intake  - Avoid restraints whenever possible, but safety first . If  restraints are necessary, please monitor CPK and BUN/Cr  - Assess and treat pain (incl nonverbal cues); e.g. consider scheduled tylenol   - Assess and treat constipation and urinary retention  - Ensure hydration, electrolyte balance, adequate oxygenation  - Review medications (addition, deletion, changes can trigger)  - If possible, avoid high-risk meds such as anticholinergics, BZ, steroids, antidepressants   # Psychosocial Support:  - Jody Shaw   # Discharge Planning: To Be Determined  Discussed with: Patient, patient's daughter, PCCM, bedside RN  Thank you for allowing the palliative care team to participate in the care Jody Shaw.  Jody Radar, DO Palliative Care Provider PMT # [phone]  If patient remains symptomatic despite maximum doses, please call PMT at [phone] between 0700 and 1900. Outside of these hours, please call attending, as PMT does not have night coverage.  Personally spent 40 minutes in patient care including extensive chart review (labs, imaging, progress/consult notes, vital signs), medically appropraite exam, discussed with treatment team, education to patient, family, and staff, documenting clinical information, medication review and management, coordination of care, and available advanced directive documents.   "

## 2024-11-02 NOTE — Plan of Care (Signed)
" °  Problem: Education: Goal: Knowledge of General Education information will improve Description: Including pain rating scale, medication(s)/side effects and non-pharmacologic comfort measures Outcome: Progressing   Problem: Clinical Measurements: Goal: Respiratory complications will improve Outcome: Progressing   Problem: Nutrition: Goal: Adequate nutrition will be maintained Outcome: Progressing   Problem: Elimination: Goal: Will not experience complications related to urinary retention Outcome: Progressing   Problem: Clinical Measurements: Goal: Cardiovascular complication will be avoided Outcome: Not Progressing   "

## 2024-11-02 NOTE — Progress Notes (Signed)
 eLink Physician-Brief Progress Note Patient Name: Jody Shaw DOB: 08-Nov-1941 MRN: [id number]   Date of Service  11/02/2024  HPI/Events of Note  83 year old female with a history of left eye blindness and MSSA bacteremia presented with septic shock off of vasopressors.  Reported that she has new anisocoria with the right 4 mm and left 2 mm pupils.  eICU Interventions  Will obtain a CT head to evaluate for new intracranial lesions/septic emboli     Intervention Category Intermediate Interventions: Change in mental status - evaluation and management  [name] 11/02/2024, 4:26 AM

## 2024-11-02 NOTE — Progress Notes (Signed)
 "  NAME:  Jody Shaw, MRN:  [id number], DOB:  02/10/1941, LOS: 4 ADMISSION DATE:  10/29/2024,   History of Present Illness:  Patient is a 83 yo F w/ pertinent PMH giant cell arteritis, hypothyroidism, CAD, GERD, lymphedema, HTN, HLD and anxiety presents to [facility] ED on 12/17 for pna and elevated creat.   On 12/17 patient sent by PCP to [facility] Ed for eval of pna and elevated creat. On arrival hypothermic and creat 2.76. wbc 13.3 and la 1.6. CXR unremarkable. Covid/flu/rsv and RVP negative. CT chest no acute abnormality; moderate hiatal hernia. UA w/ small leukocytes. Cultures obtained, given fluids, and started on rocephin /azithro. Strep pna negative and legionella pending. Cultures came back w/ MSSA. ID consulted and abx changed to ancef . Echo pending. On 12/19 bp soft getting fluids and midodrine . Added stress dose steroids. Pccm consulted.     Pertinent  Medical History   Past Medical History:  Diagnosis Date   Coronary artery disease    s/p rotational atherectomy to the LAD in 1996 // Nuc 9/13: no ischemia   Echocardiogram    Echo 9/19:  EF 55-60, normal wall motion, grade 1 diastolic dysfunction, MAC   GERD (gastroesophageal reflux disease)    Giant cell arteritis (HCC)    HLD (hyperlipidemia)    Hypothyroidism    Vision loss of left eye      Significant Hospital Events: Including procedures, antibiotic start and stop dates in addition to other pertinent events   12/17 admit 12/19 hypotension despite fluids; pccm consulted 12/20 MSSA bacteremia on levophed   12/21 off levophed , but confused - CT head negative   Interim History / Subjective:  Patient seems confused today. She had a head CT negative. She respond to questions but keep asking somebody from the [employer] to come to fix her phone. No focalization.  Objective    Blood pressure (!) 90/47, pulse 65, temperature (!) 95.7 F (35.4 C), temperature source Axillary, resp. rate 10, weight 96 kg, SpO2 96%.         Intake/Output Summary (Last 24 hours) at 11/02/2024 1442 Last data filed at 11/02/2024 1200 Gross per 24 hour  Intake 859.22 ml  Output 730 ml  Net 129.22 ml   Filed Weights   11/01/24 0500 11/02/24 0500  Weight: 97 kg 96 kg    Examination: Physical Exam Constitutional:      Appearance: Normal appearance.  HENT:     Head: Normocephalic.     Mouth/Throat:     Mouth: Mucous membranes are moist.  Cardiovascular:     Rate and Rhythm: Normal rate and regular rhythm.  Pulmonary:     Effort: Pulmonary effort is normal.     Breath sounds: Normal breath sounds.     Comments: No wheezing Abdominal:     General: Bowel sounds are normal.     Palpations: Abdomen is soft.  Musculoskeletal:     Comments: Move 4 extremities, Les with dressing   Skin:    General: Skin is warm.  Neurological:     General: No focal deficit present.     Mental Status: She is alert and oriented to person, place, and time.     Assessment and Plan   Delirium  Patient is confused today, but respond to questions.  Head CT neg overnight  Delirium precaution- promote normal sleep, reorient frequently, familiarity&stimulation.  Melatonin qhs  Septic shock resolved MSSA bacteremia Plan: -Off levophed  -x2 blood cx with MSSA > on cefazolin   -TEE next week,  echo without vegetation- normal EF 60%. Normal RV fx and size. Mod MS -Afebrile   AKI on CKD 3b Lactic acidosis resolved  Metabolic acidosis Anemia of CKD Plan: -Trend BMP / urinary output - 0.63ml/kg/h -Trend CBC. Afebrile. WBC trendign down -Replace electrolytes as indicated -Avoid nephrotoxic agents, ensure adequate renal perfusion   Suspected underlying COPD with likely mild exacerbation: -Outpatient PFTs.   -Initiated on anoro Ellipta . -On stress dose steroids > tapering down  -DuoNebs as needed.   Chronic Lymphedema Plan: -woc following; continue dressing wrap -pt/ot -f/u outpt vascular   Anxiety Plan: -sertraline    Hx of  giant cell arteritis -on chronic prednisone  5 mg daily  Plan: -stress dose steroids 100 gm BID tapering down given resolution of shock   Hypothyroidism Plan: -tsh normal -synthroid    CAD HTN HLD Plan: -asa and statin -hold anti-htn meds  GI -gi panel pending; cdiff negative   GOC: patient is currently listed as DNR/DNI; palliative care following  Best Practice (right click and Reselect all SmartList Selections daily)   Diet/type: Regular consistency (see orders) DVT prophylaxis prophylactic heparin   Pressure ulcer(s): N/A GI prophylaxis: N/A Lines: N/A PIV Foley:  Yes, and it is still needed Code Status:  DNR Last date of multidisciplinary goals of care discussion [yesterday]  Labs   CBC: Recent Labs  Lab 10/29/24 1441 10/30/24 0709 10/30/24 1525 10/31/24 0311 11/01/24 0250 11/02/24 0310  WBC 13.3* 12.2*  --  10.8* 21.3* 17.4*  NEUTROABS 11.1*  --   --  9.8* 19.8* 16.0*  HGB 8.9* 6.4* 9.0* 9.4* 9.0* 8.7*  HCT 29.8* 20.7* 28.5* 30.3* 28.3* 27.9*  MCV 86.4 85.2  --  86.3 85.2 85.6  PLT 264 213  --  201 249 206    Basic Metabolic Panel: Recent Labs  Lab 10/29/24 1441 10/30/24 0546 10/31/24 0311 11/01/24 0250 11/02/24 0310  NA 139 140 142 142 144  K 5.1 4.6 4.9 4.6 4.1  CL 107 111 113* 111 114*  CO2 15* 15* 14* 15* 15*  GLUCOSE 113* 80 77 138* 125*  BUN 61* 49* 48* 46* 49*  CREATININE 2.76* 2.36* 2.43* 2.51* 2.78*  CALCIUM  8.5* 7.3* 7.5* 7.6* 7.9*  MG  --   --  1.8 2.1 2.1  PHOS  --   --   --  5.1* 4.6   GFR: CrCl cannot be calculated (Unknown ideal weight.). Recent Labs  Lab 10/30/24 0302 10/30/24 0546 10/30/24 0709 10/31/24 0311 10/31/24 1020 11/01/24 0250 11/02/24 0310  PROCALCITON  --  0.28  --   --   --   --   --   WBC  --   --  12.2* 10.8*  --  21.3* 17.4*  LATICACIDVEN 3.5* 2.9*  --   --  2.0* 1.6  --     Liver Function Tests: Recent Labs  Lab 10/29/24 1441 10/31/24 0311  AST 20 23  ALT 24 15  ALKPHOS 124 99  BILITOT  0.2 0.2  PROT 7.0 5.2*  ALBUMIN  3.2* 2.4*   No results for input(s): LIPASE, AMYLASE in the last 168 hours. No results for input(s): AMMONIA in the last 168 hours.  ABG    Component Value Date/Time   HCO3 16.5 (L) 10/31/2024 1222   ACIDBASEDEF 9.6 (H) 10/31/2024 1222   O2SAT 86.4 10/31/2024 1222     Coagulation Profile: Recent Labs  Lab 10/29/24 1441  INR 1.2    Cardiac Enzymes: No results for input(s): CKTOTAL, CKMB, CKMBINDEX, TROPONINI in the last 168  hours.  HbA1C: No results found for: HGBA1C  CBG: Recent Labs  Lab 11/01/24 1618 11/01/24 1941 11/01/24 2344 11/02/24 0350 11/02/24 0746  GLUCAP 167* 114* 120* 122* 121*    Review of Systems:   As above  Past Medical History:  She,  has a past medical history of Coronary artery disease, Echocardiogram, GERD (gastroesophageal reflux disease), Giant cell arteritis (HCC), HLD (hyperlipidemia), Hypothyroidism, and Vision loss of left eye.   Surgical History:   Past Surgical History:  Procedure Laterality Date   ARTERY BIOPSY Left 08/03/2017   Procedure: BIOPSY TEMPORAL ARTERY;  Surgeon: Stevie, Herlene Righter, MD;  Location: [facility];  Service: General;  Laterality: Left;     Social History:   reports that she has quit smoking. She has never used smokeless tobacco. She reports that she does not drink alcohol  and does not use drugs.   Family History:  Her family history includes Heart disease in her father and mother.   Allergies Allergies[1]   Home Medications  Prior to Admission medications  Medication Sig Start Date End Date Taking? Authorizing Provider  aspirin  EC 81 MG tablet Take 81 mg by mouth daily. Swallow whole.   Yes [provider]  atenolol  (TENORMIN ) 25 MG tablet TAKE 1 TABLET BY MOUTH EVERY DAY. Patient taking differently: Take 25 mg by mouth in the morning. 11/09/23  Yes Jerilynn Lamarr HERO, NP  calcium  carbonate (TUMS - DOSED IN MG ELEMENTAL CALCIUM ) 500 MG chewable  tablet Chew 1 tablet by mouth 2 (two) times daily as needed for indigestion or heartburn.   Yes [provider]  Dextromethorphan-guaiFENesin  (GOODSENSE TUSSIN DM) 20-200 MG/20ML LIQD Take 2.5 mLs by mouth daily as needed (for coughing).   Yes [provider]  ibuprofen (ADVIL) 200 MG tablet Take 400 mg by mouth daily as needed for mild pain (pain score 1-3), headache or fever.   Yes [provider]  isosorbide  mononitrate (IMDUR ) 60 MG 24 hr tablet Take 30 mg by mouth every evening.   Yes [provider]  levothyroxine  (SYNTHROID , LEVOTHROID) 88 MCG tablet Take 88 mcg by mouth daily before breakfast.   Yes [provider]  Multiple Vitamin (MULTI VITAMIN DAILY) TABS Take 1 tablet by mouth daily with breakfast.   Yes [provider]  nitroGLYCERIN  (NITROSTAT ) 0.4 MG SL tablet Place 1 tablet (0.4 mg total) under the tongue every 5 (five) minutes as needed for chest pain. 10/19/22  Yes Conte, Tessa N, PA-C  potassium chloride  (KLOR-CON ) 10 MEQ tablet Take 10 mEq by mouth daily.   Yes [provider]  predniSONE  (DELTASONE ) 5 MG tablet Take 5 mg by mouth daily with breakfast.   Yes [provider]  rosuvastatin  (CRESTOR ) 10 MG tablet Take 1 tablet by mouth 2 times a week. Patient taking differently: Take 10 mg by mouth See admin instructions. Take 10 mg by mouth at bedtime on Sundays and Wednesdays 11/09/23  Yes Jerilynn Lamarr HERO, NP  sertraline  (ZOLOFT ) 25 MG tablet Take 25 mg by mouth daily. 02/15/24  Yes [provider]  sodium chloride  (MURO 128) 5 % ophthalmic solution Place 1 drop into the right eye every 4 (four) hours as needed for eye irritation.   Yes [provider]  SYSTANE HYDRATION PF 0.4-0.3 % SOLN Place 1 drop into both eyes 3 (three) times daily as needed (for dryness).   Yes [provider]  triamcinolone cream (KENALOG) 0.1 % Apply 1 Application topically every 2 (two) hours as needed (for  itching).   Yes [provider]  isosorbide  mononitrate (IMDUR ) 30 MG 24 hr tablet TAKE 1 TABLET BY MOUTH EVERY EVENING Patient not taking: Reported on 10/29/2024 03/11/24   [name], [name], DO  potassium chloride  SA (K-DUR) 20 MEQ tablet Take 1 tablet (20 mEq total) by mouth daily. Patient not taking: Reported on 10/29/2024 05/09/19   Claudene Victory ORN, MD       The patient is critically ill due to septic shock with MSSA.  Critical care was necessary to treat or prevent imminent or life-threatening deterioration. Critical care time was spent by me on the following activities: development of a treatment plan with the patient and/or surrogate as well as nursing, discussions with consultants, evaluation of the patient's response to treatment, examination of the patient, obtaining a history from the patient or surrogate, ordering and performing treatments and interventions, ordering and review of laboratory studies, ordering and review of radiographic studies, review of telemetry data including pulse oximetry, re-evaluation of patient's condition and participation in multidisciplinary rounds.   I personally spent 35 minutes providing critical care not including any separately billable procedures.  Marny Patch, MD [facility] 11/02/2024 2:41 PM                 [1]  Allergies Allergen Reactions   Ace Inhibitors Other (See Comments)    Severe AKI from dehydration and ARB   Angiotensin Receptor Blockers Other (See Comments)    Severe AKI from dehydration and ARB   Tetanus Toxoid, Adsorbed Swelling and Other (See Comments)    Arm became swollen   Watermelon [Citrullus Vulgaris] Swelling and Other (See Comments)    Tongue became swollen   Iodine Rash   Sulfa Antibiotics Rash   "

## 2024-11-03 ENCOUNTER — Inpatient Hospital Stay (HOSPITAL_COMMUNITY)

## 2024-11-03 ENCOUNTER — Other Ambulatory Visit: Payer: Self-pay

## 2024-11-03 ENCOUNTER — Other Ambulatory Visit (HOSPITAL_COMMUNITY): Payer: Self-pay

## 2024-11-03 ENCOUNTER — Encounter (HOSPITAL_COMMUNITY): Admission: EM | Disposition: E | Payer: Self-pay | Source: Ambulatory Visit | Attending: Student

## 2024-11-03 ENCOUNTER — Telehealth (HOSPITAL_COMMUNITY): Payer: Self-pay

## 2024-11-03 DIAGNOSIS — N1832 Chronic kidney disease, stage 3b: Secondary | ICD-10-CM | POA: Diagnosis not present

## 2024-11-03 DIAGNOSIS — B9561 Methicillin susceptible Staphylococcus aureus infection as the cause of diseases classified elsewhere: Secondary | ICD-10-CM | POA: Diagnosis not present

## 2024-11-03 DIAGNOSIS — F419 Anxiety disorder, unspecified: Secondary | ICD-10-CM | POA: Diagnosis not present

## 2024-11-03 DIAGNOSIS — I2510 Atherosclerotic heart disease of native coronary artery without angina pectoris: Secondary | ICD-10-CM

## 2024-11-03 DIAGNOSIS — N179 Acute kidney failure, unspecified: Secondary | ICD-10-CM | POA: Diagnosis not present

## 2024-11-03 DIAGNOSIS — I959 Hypotension, unspecified: Secondary | ICD-10-CM | POA: Diagnosis not present

## 2024-11-03 DIAGNOSIS — A419 Sepsis, unspecified organism: Secondary | ICD-10-CM | POA: Diagnosis not present

## 2024-11-03 DIAGNOSIS — R7881 Bacteremia: Secondary | ICD-10-CM | POA: Diagnosis not present

## 2024-11-03 DIAGNOSIS — A4101 Sepsis due to Methicillin susceptible Staphylococcus aureus: Secondary | ICD-10-CM | POA: Diagnosis not present

## 2024-11-03 DIAGNOSIS — R6520 Severe sepsis without septic shock: Secondary | ICD-10-CM | POA: Diagnosis not present

## 2024-11-03 DIAGNOSIS — T68XXXD Hypothermia, subsequent encounter: Secondary | ICD-10-CM | POA: Diagnosis not present

## 2024-11-03 DIAGNOSIS — F32A Depression, unspecified: Secondary | ICD-10-CM | POA: Diagnosis not present

## 2024-11-03 DIAGNOSIS — R6521 Severe sepsis with septic shock: Secondary | ICD-10-CM | POA: Diagnosis not present

## 2024-11-03 DIAGNOSIS — I890 Lymphedema, not elsewhere classified: Secondary | ICD-10-CM | POA: Diagnosis not present

## 2024-11-03 LAB — CBC WITH DIFFERENTIAL/PLATELET
Abs Immature Granulocytes: 0.55 K/uL — ABNORMAL HIGH (ref 0.00–0.07)
Basophils Absolute: 0 K/uL (ref 0.0–0.1)
Basophils Relative: 0 %
Eosinophils Absolute: 0 K/uL (ref 0.0–0.5)
Eosinophils Relative: 0 %
HCT: 23.5 % — ABNORMAL LOW (ref 36.0–46.0)
Hemoglobin: 7.4 g/dL — ABNORMAL LOW (ref 12.0–15.0)
Immature Granulocytes: 3 %
Lymphocytes Relative: 2 %
Lymphs Abs: 0.2 K/uL — ABNORMAL LOW (ref 0.7–4.0)
MCH: 26.7 pg (ref 26.0–34.0)
MCHC: 31.5 g/dL (ref 30.0–36.0)
MCV: 84.8 fL (ref 80.0–100.0)
Monocytes Absolute: 0.9 K/uL (ref 0.1–1.0)
Monocytes Relative: 5 %
Neutro Abs: 14.5 K/uL — ABNORMAL HIGH (ref 1.7–7.7)
Neutrophils Relative %: 90 %
Platelets: 182 K/uL (ref 150–400)
RBC: 2.77 MIL/uL — ABNORMAL LOW (ref 3.87–5.11)
RDW: 20.3 % — ABNORMAL HIGH (ref 11.5–15.5)
WBC: 16.2 K/uL — ABNORMAL HIGH (ref 4.0–10.5)
nRBC: 0.2 % (ref 0.0–0.2)

## 2024-11-03 LAB — GLUCOSE, CAPILLARY
Glucose-Capillary: 100 mg/dL — ABNORMAL HIGH (ref 70–99)
Glucose-Capillary: 109 mg/dL — ABNORMAL HIGH (ref 70–99)
Glucose-Capillary: 112 mg/dL — ABNORMAL HIGH (ref 70–99)
Glucose-Capillary: 118 mg/dL — ABNORMAL HIGH (ref 70–99)
Glucose-Capillary: 97 mg/dL (ref 70–99)
Glucose-Capillary: 98 mg/dL (ref 70–99)

## 2024-11-03 LAB — BASIC METABOLIC PANEL WITH GFR
Anion gap: 14 (ref 5–15)
BUN: 51 mg/dL — ABNORMAL HIGH (ref 8–23)
CO2: 17 mmol/L — ABNORMAL LOW (ref 22–32)
Calcium: 8 mg/dL — ABNORMAL LOW (ref 8.9–10.3)
Chloride: 112 mmol/L — ABNORMAL HIGH (ref 98–111)
Creatinine, Ser: 3.05 mg/dL — ABNORMAL HIGH (ref 0.44–1.00)
GFR, Estimated: 14 mL/min — ABNORMAL LOW
Glucose, Bld: 100 mg/dL — ABNORMAL HIGH (ref 70–99)
Potassium: 4 mmol/L (ref 3.5–5.1)
Sodium: 143 mmol/L (ref 135–145)

## 2024-11-03 LAB — PHOSPHORUS: Phosphorus: 4.7 mg/dL — ABNORMAL HIGH (ref 2.5–4.6)

## 2024-11-03 LAB — MAGNESIUM: Magnesium: 2.3 mg/dL (ref 1.7–2.4)

## 2024-11-03 SURGERY — TRANSESOPHAGEAL ECHOCARDIOGRAM (TEE) (CATHLAB)
Anesthesia: Monitor Anesthesia Care

## 2024-11-03 MED ORDER — CEFAZOLIN SODIUM-DEXTROSE 2-4 GM/100ML-% IV SOLN
2.0000 g | Freq: Two times a day (BID) | INTRAVENOUS | Status: DC
  Administered 2024-11-03 – 2024-11-08 (×10): 2 g via INTRAVENOUS
  Filled 2024-11-03 (×10): qty 100

## 2024-11-03 MED ORDER — PREDNISONE 5 MG PO TABS
5.0000 mg | ORAL_TABLET | Freq: Every day | ORAL | Status: DC
  Administered 2024-11-05: 5 mg via ORAL
  Filled 2024-11-03: qty 1

## 2024-11-03 MED ORDER — CEFAZOLIN SODIUM-DEXTROSE 2-4 GM/100ML-% IV SOLN: 2.0000 g | INTRAVENOUS | Status: DC

## 2024-11-03 NOTE — Plan of Care (Signed)
  Problem: Education: Goal: Knowledge of General Education information will improve Description: Including pain rating scale, medication(s)/side effects and non-pharmacologic comfort measures Outcome: Progressing   Problem: Clinical Measurements: Goal: Respiratory complications will improve Outcome: Progressing Goal: Cardiovascular complication will be avoided Outcome: Progressing   Problem: Nutrition: Goal: Adequate nutrition will be maintained Outcome: Progressing   Problem: Pain Managment: Goal: General experience of comfort will improve and/or be controlled Outcome: Not Progressing

## 2024-11-03 NOTE — TOC Initial Note (Signed)
 Transition of Care [facility]) - Initial/Assessment Note    Patient Details  Name: Jody Shaw MRN: [id number] Date of Birth: 1941/08/27  [facility] CM/SW Contact:    Jon ONEIDA Anon, RN Phone Number: 11/03/2024, 12:06 PM  Clinical Narrative:                 Pt is from home. Admitted with diagnosis of septic shock. ICM acknowledges PT/OT recommendations for SNF placement. Will follow for medical readiness. Palliative medicine following and having GOC discussions with pt daughter/son. ICM continuing to follow for DC planning needs.    Expected Discharge Plan:  (TBD) Barriers to Discharge: Continued Medical Work up   Patient Goals and CMS Choice Patient states their goals for this hospitalization and ongoing recovery are:: Home CMS Medicare.gov Compare Post Acute Care list provided to:: Patient Represenative (must comment) (Pt daughter) Choice offered to / list presented to : Adult Children [place] ownership interest in [facility].provided to:: Adult Children    Expected Discharge Plan and Services In-house Referral: Hospice / Palliative Care Discharge Planning Services: CM Consult Post Acute Care Choice: Durable Medical Equipment Living arrangements for the past 2 months: Single Family Home                 DME Arranged: N/A DME Agency: NA       HH Arranged: NA HH Agency: NA        Prior Living Arrangements/Services Living arrangements for the past 2 months: Single Family Home Lives with:: Self Patient language and need for interpreter reviewed:: Yes Do you feel safe going back to the place where you live?: Yes      Need for Family Participation in Patient Care: Yes (Comment) Care giver support system in place?: Yes (comment) Current home services: DME Criminal Activity/Legal Involvement Pertinent to Current Situation/Hospitalization: No - Comment as needed  Activities of Daily Living      Permission Sought/Granted Permission sought  to share information with : Family Supports    Share Information with NAME: Daune, Divirgilio  Daughter, Emergency Contact  [phone]           Emotional Assessment Appearance:: Appears stated age Attitude/Demeanor/Rapport: Unable to Assess Affect (typically observed): Unable to Assess   Alcohol  / Substance Use: Not Applicable Psych Involvement: No (comment)  Admission diagnosis:  Hypotension [I95.9] Hypothermia, initial encounter [T68.XXXA] Sepsis (HCC) [A41.9] Patient Active Problem List   Diagnosis Date Noted   Goals of care, counseling/discussion 11/02/2024   Septic shock (HCC) 10/31/2024   Pressure injury of skin 10/31/2024   Palliative care encounter 10/31/2024   Counseling and coordination of care 10/31/2024   Hypotension 10/30/2024   MSSA bacteremia 10/30/2024   Severe sepsis without septic shock (HCC) 10/29/2024   Hypothermia 10/29/2024   Community acquired pneumonia 10/29/2024   Generalized weakness 10/29/2024   Acute renal failure superimposed on stage 3b chronic kidney disease (HCC) 10/29/2024   AKI (acute kidney injury) 03/24/2024   Leukocytosis 03/24/2024   Chronic acquired lymphedema 03/24/2024   Anxiety and depression 03/24/2024   Vision loss of left eye 07/31/2017   Difficulty swallowing 07/31/2017   Hypokalemia 07/31/2017   Giant cell arteritis (HCC) 07/31/2017   GERD (gastroesophageal reflux disease)    Hypothyroidism    Essential hypertension 11/24/2014   CAD (coronary artery disease) 11/24/2014   Hyperlipidemia 11/24/2014   PCP:  Nichole Senior, MD Pharmacy:   [facility] - [place], [place] - [address]  [place] [address] Phone: [phone] Fax: [phone]     Social Drivers of Health (SDOH) Social History: SDOH Screenings   Food Insecurity: No Food Insecurity (11/01/2024)  Housing: Low Risk (11/01/2024)  Transportation Needs: No Transportation Needs (11/01/2024)  Utilities: Not At Risk  (11/01/2024)  Financial Resource Strain: Low Risk (10/16/2023)   [facility]  Social Connections: Socially Isolated (11/02/2024)  Tobacco Use: Medium Risk (03/25/2024)   SDOH Interventions:     Readmission Risk Interventions    11/03/2024   12:03 PM 10/31/2024    2:41 PM 03/25/2024    2:11 PM  Readmission Risk Prevention Plan  Transportation Screening Complete Complete Complete  PCP or Specialist Appt within 5-7 Days Complete Complete Complete  Home Care Screening Complete Complete Complete  Medication Review (RN CM) Complete Complete Complete

## 2024-11-03 NOTE — Telephone Encounter (Signed)
 Pharmacy Patient Advocate Encounter  Insurance verification completed.    The patient is insured through [facility]. Patient has Medicare and is not eligible for a copay card, but may be able to apply for patient assistance or Medicare RX Payment Plan (Patient Must reach out to their plan, if eligible for payment plan), if available.    Ran test claim for Anoro Ellipta  62.5-25mcg and the current 30 day co-pay is $47.   This test claim was processed through [facility]- copay amounts may vary at other pharmacies due to [employer], or as the patient moves through the different stages of their insurance plan.

## 2024-11-03 NOTE — Plan of Care (Signed)

## 2024-11-03 NOTE — Progress Notes (Signed)
 " Daily Progress Note   Patient Name: Jody Shaw       Date: 11/03/2024 DOB: Feb 14, 1941  Age: 83 y.o. MRN#: [id number] Attending Physician: Adrien Guan, Tamala* Primary Care Physician: Nichole Senior, MD Admit Date: 10/29/2024 Length of Stay: 5 days  Reason for Consultation/Follow-up: Establishing goals of care  Subjective:   Reviewed EMR including recent documentation from PCCM and PT/OT.  Reviewed recent BMP noting patient's   creatinine elevated at 2.78 on 12-21 - now 3.05 on 12-22, and GFR low at 14.     Discussed with family at bedside, see below.   Objective:   Vital Signs:  BP (!) 91/46   Pulse 68   Temp (!) 97 F (36.1 C) (Axillary) Comment: Notified RN  Resp 14   Ht 5' 2 (1.575 m)   Wt 98 kg   SpO2 96%   BMI 39.52 kg/m   Physical Exam: General: NAD, chronically ill-appearing, pleasantly confused, sitting in chair.  Cardiovascular: RRR Respiratory: no increased work of breathing noted, not in respiratory distress Abdomen: not distended Neuro: Pleasantly confused, awake  Assessment & Plan:   Assessment: Patient is an 83 year old female with a past medical history of CAD, lymphedema, CKD stage IIIb, anemia, GERD, hypothyroidism, giant cell arteritis, hypothyroidism, HTN, and HLD who was sent to the ED by her PCP on 10/29/2024 for evaluation of possible pneumonia and elevated creatinine.  Chest x-ray at PCPs office showed pneumonia however repeat chest x-ray and CT chest on admission with no acute abnormalities. Patient is admitted with severe sepsis secondary to UTI, concern for pneumonia, lactic acidosis, and AKI on CKD. Palliative Medicine has been consulted for goals of care discussions and complex medical decision making.   Recommendations/Plan: # Complex medical decision making/goals of care:  - Patient awake resting in chair, mumble some words, appears in generalized weakness.  Son at bedside.   Discussed with son about ID recommendations, about  patient's current condition and her renal dysfunction to the best of my ability. Discussed the importance of continued conversations to best support patient's quality of life moving forward.  Did introduce idea that if patient is not improving as medically hoped, may need to consider what medical interventions would be pursued and if would want to focus on comfort with hospice.  Palliative medicine team continuing to follow along with patient's medical journey.  -  Code Status: Limited: Do not attempt resuscitation (DNR) -DNR-LIMITED -Do Not Intubate/DNI   #Symptom management Non-pharmacologic Delirium Precautions  - Frequent re-orientation; update board w/ date, names of treatment team  - Daytime stimulation: Open curtains, turn lights on, and turn on television as tolerated during waking hours  - Nighttime calm - close curtains, turn lights off, and turn off television at 9pm  with minimal interruptions from treatment team during sleeping hours, including avoiding lab draws if possible  - Encourage continued presence of family/friends  - Occupy w/ distractions - e.g. ask them to fold washcloths, busy-board  - Encourage movement with PT/OT as tolerated  - Ensure adequate sensorium, to include providing glasses, dentures, and hearing aids to patient as appropriate  - Ensure adequate nutrition and fluid intake  - Avoid restraints whenever possible, but safety first . If restraints are necessary, please monitor CPK and BUN/Cr  - Assess and treat pain (incl nonverbal cues); e.g. consider scheduled tylenol   - Assess and treat constipation and urinary retention  - Ensure hydration, electrolyte balance, adequate oxygenation  - Review medications (addition, deletion, changes can trigger)  -  If possible, avoid high-risk meds such as anticholinergics, BZ, steroids, antidepressants   # Psychosocial Support:  - Daughter-Joanne, Richardson who live locally, son Lemond who has arrived from [place] .   #  Discharge Planning: To Be Determined  Discussed with: Patient, patient's son Lemond, bedside RN  Thank you for allowing the palliative care team to participate in the care Jody Shaw. High MDM Lonia Serve MD.  Palliative Care Provider PMT # [phone]  If patient remains symptomatic despite maximum doses, please call PMT at [phone] between 0700 and 1900. Outside of these hours, please call attending, as PMT does not have night coverage.   "

## 2024-11-03 NOTE — Progress Notes (Signed)
 Jody Shaw and her son, Jody Shaw, would like to deliberate further on whether she will get a TEE. They do not want to continue with the TEE scheduled today 11/03/2024. Jody Shaw is at bedside and would like to speak to the primary provider in the morning and talk with his siblings before they proceed.

## 2024-11-03 NOTE — Progress Notes (Signed)
 "  NAME:  Jody Shaw, MRN:  [id number], DOB:  11/30/1940, LOS: 5 ADMISSION DATE:  10/29/2024,   History of Present Illness:  Patient is a 83 yo F w/ pertinent PMH giant cell arteritis, hypothyroidism, CAD, GERD, lymphedema, HTN, HLD and anxiety presents to [facility] ED on 12/17 for pna and elevated creat.   On 12/17 patient sent by PCP to [facility] Ed for eval of pna and elevated creat. On arrival hypothermic and creat 2.76. wbc 13.3 and la 1.6. CXR unremarkable. Covid/flu/rsv and RVP negative. CT chest no acute abnormality; moderate hiatal hernia. UA w/ small leukocytes. Cultures obtained, given fluids, and started on rocephin /azithro. Strep pna negative and legionella pending. Cultures came back w/ MSSA. ID consulted and abx changed to ancef . Echo pending. On 12/19 bp soft getting fluids and midodrine . Added stress dose steroids. Pccm consulted.     Pertinent  Medical History   Past Medical History:  Diagnosis Date   Coronary artery disease    s/p rotational atherectomy to the LAD in 1996 // Nuc 9/13: no ischemia   Echocardiogram    Echo 9/19:  EF 55-60, normal wall motion, grade 1 diastolic dysfunction, MAC   GERD (gastroesophageal reflux disease)    Giant cell arteritis (HCC)    HLD (hyperlipidemia)    Hypothyroidism    Vision loss of left eye      Significant Hospital Events: Including procedures, antibiotic start and stop dates in addition to other pertinent events   12/17 admit 12/19 hypotension despite fluids; pccm consulted 12/20 MSSA bacteremia on levophed   12/21 off levophed , but confused - CT head negative  12/22 patient does not want the TEE. Plan for longer antibiotic therapy. Per ID she can be transition to PO.  Interim History / Subjective:  Patient seems confused today. She had a head CT negative. She respond to questions but keep asking somebody from the [employer] to come to fix her phone. No focalization.  Objective    Blood pressure (!) 91/46, pulse 68,  temperature (!) 97 F (36.1 C), temperature source Axillary, resp. rate 14, height 5' 2 (1.575 m), weight 98 kg, SpO2 96%.        Intake/Output Summary (Last 24 hours) at 11/03/2024 1309 Last data filed at 11/03/2024 1200 Gross per 24 hour  Intake 680 ml  Output 425 ml  Net 255 ml   Filed Weights   11/01/24 0500 11/02/24 0500 11/03/24 0500  Weight: 97 kg 96 kg 98 kg    Examination: Physical Exam Constitutional:      Appearance: Normal appearance.  HENT:     Head: Normocephalic.     Mouth/Throat:     Mouth: Mucous membranes are moist.  Cardiovascular:     Rate and Rhythm: Normal rate and regular rhythm.  Pulmonary:     Effort: Pulmonary effort is normal.     Breath sounds: Normal breath sounds.     Comments: No wheezing Abdominal:     General: Bowel sounds are normal.     Palpations: Abdomen is soft.  Musculoskeletal:     Comments: Move 4 extremities, Legs with dressing   Skin:    General: Skin is warm.  Neurological:     General: No focal deficit present.     Mental Status: She is alert and oriented to person, place, and time.     Comments: Sometimes she seems confused on and off, but able to respond to questions.     Assessment and Plan   Delirium  improving Patient and son at bedside, they declined TEE procedure, given possible complications.  Head CT neg Delirium precaution- promote normal sleep, reorient frequently, familiarity&stimulation.  Melatonin qhs  Septic shock resolved MSSA bacteremia Plan: -Off levophed  -x2 blood cx with MSSA > on cefazolin   -echo without vegetation- normal EF 60%. Normal RV fx and size. Mod MS -ID aware of cancellation for TEE. Family understands she would need longer antibiotics given we dont have TEE. Blood cx negative so far. ID planning to do PO atb. -Afebrile  -Transfer to gen med.   AKI on CKD 3b Lactic acidosis resolved  Metabolic acidosis Anemia of CKD Plan: -Trend BMP / urinary output - 0.76ml/kg/h -Trend  CBC. Afebrile. WBC trendign down -Replace electrolytes as indicated -Avoid nephrotoxic agents, ensure adequate renal perfusion   Suspected underlying COPD with likely mild exacerbation: -Outpatient PFTs.   -Initiated on anoro Ellipta . -On stress dose steroids > tapering down  -DuoNebs as needed.   Chronic Lymphedema Plan: -woc following; continue dressing wrap -pt/ot -f/u outpt vascular   Anxiety Plan: -sertraline    Hx of giant cell arteritis -on chronic prednisone  5 mg daily  Plan: -stress dose steroids 100 gm BID tapering down given resolution of shock   Hypothyroidism Plan: -tsh normal -synthroid    CAD HTN HLD Plan: -asa and statin -hold anti-htn meds  GI -gi panel pending; cdiff negative   GOC: patient is currently listed as DNR/DNI; palliative care following.   Best Practice (right click and Reselect all SmartList Selections daily)   Diet/type: Regular consistency (see orders) DVT prophylaxis prophylactic heparin   Pressure ulcer(s): N/A GI prophylaxis: N/A Lines: N/A PIV Foley: remove foley today Code Status:  DNR Last date of multidisciplinary goals of care discussion [yesterday]  Labs   CBC: Recent Labs  Lab 10/29/24 1441 10/30/24 0709 10/30/24 1525 10/31/24 0311 11/01/24 0250 11/02/24 0310 11/03/24 0242  WBC 13.3* 12.2*  --  10.8* 21.3* 17.4* 16.2*  NEUTROABS 11.1*  --   --  9.8* 19.8* 16.0* 14.5*  HGB 8.9* 6.4* 9.0* 9.4* 9.0* 8.7* 7.4*  HCT 29.8* 20.7* 28.5* 30.3* 28.3* 27.9* 23.5*  MCV 86.4 85.2  --  86.3 85.2 85.6 84.8  PLT 264 213  --  201 249 206 182    Basic Metabolic Panel: Recent Labs  Lab 10/30/24 0546 10/31/24 0311 11/01/24 0250 11/02/24 0310 11/03/24 0242  NA 140 142 142 144 143  K 4.6 4.9 4.6 4.1 4.0  CL 111 113* 111 114* 112*  CO2 15* 14* 15* 15* 17*  GLUCOSE 80 77 138* 125* 100*  BUN 49* 48* 46* 49* 51*  CREATININE 2.36* 2.43* 2.51* 2.78* 3.05*  CALCIUM  7.3* 7.5* 7.6* 7.9* 8.0*  MG  --  1.8 2.1 2.1 2.3   PHOS  --   --  5.1* 4.6 4.7*   GFR: Estimated Creatinine Clearance: 14.8 mL/min (A) (by C-G formula based on SCr of 3.05 mg/dL (H)). Recent Labs  Lab 10/30/24 0302 10/30/24 0546 10/30/24 0709 10/31/24 0311 10/31/24 1020 11/01/24 0250 11/02/24 0310 11/03/24 0242  PROCALCITON  --  0.28  --   --   --   --   --   --   WBC  --   --    < > 10.8*  --  21.3* 17.4* 16.2*  LATICACIDVEN 3.5* 2.9*  --   --  2.0* 1.6  --   --    < > = values in this interval not displayed.    Liver Function Tests: Recent Labs  Lab 10/29/24 1441 10/31/24 0311  AST 20 23  ALT 24 15  ALKPHOS 124 99  BILITOT 0.2 0.2  PROT 7.0 5.2*  ALBUMIN  3.2* 2.4*   No results for input(s): LIPASE, AMYLASE in the last 168 hours. No results for input(s): AMMONIA in the last 168 hours.  ABG    Component Value Date/Time   HCO3 16.5 (L) 10/31/2024 1222   ACIDBASEDEF 9.6 (H) 10/31/2024 1222   O2SAT 86.4 10/31/2024 1222     Coagulation Profile: Recent Labs  Lab 10/29/24 1441  INR 1.2    Cardiac Enzymes: No results for input(s): CKTOTAL, CKMB, CKMBINDEX, TROPONINI in the last 168 hours.  HbA1C: No results found for: HGBA1C  CBG: Recent Labs  Lab 11/02/24 1604 11/02/24 1930 11/03/24 0242 11/03/24 0735 11/03/24 1147  GLUCAP 123* 134* 97 98 100*    Review of Systems:   As above  Past Medical History:  She,  has a past medical history of Coronary artery disease, Echocardiogram, GERD (gastroesophageal reflux disease), Giant cell arteritis (HCC), HLD (hyperlipidemia), Hypothyroidism, and Vision loss of left eye.   Surgical History:   Past Surgical History:  Procedure Laterality Date   ARTERY BIOPSY Left 08/03/2017   Procedure: BIOPSY TEMPORAL ARTERY;  Surgeon: Stevie, Herlene Righter, MD;  Location: [facility];  Service: General;  Laterality: Left;     Social History:   reports that she has quit smoking. She has never used smokeless tobacco. She reports that she does not drink alcohol   and does not use drugs.   Family History:  Her family history includes Heart disease in her father and mother.   Allergies Allergies[1]   Home Medications  Prior to Admission medications  Medication Sig Start Date End Date Taking? Authorizing Provider  aspirin  EC 81 MG tablet Take 81 mg by mouth daily. Swallow whole.   Yes [provider]  atenolol  (TENORMIN ) 25 MG tablet TAKE 1 TABLET BY MOUTH EVERY DAY. Patient taking differently: Take 25 mg by mouth in the morning. 11/09/23  Yes Jerilynn Lamarr HERO, NP  calcium  carbonate (TUMS - DOSED IN MG ELEMENTAL CALCIUM ) 500 MG chewable tablet Chew 1 tablet by mouth 2 (two) times daily as needed for indigestion or heartburn.   Yes [provider]  Dextromethorphan-guaiFENesin  (GOODSENSE TUSSIN DM) 20-200 MG/20ML LIQD Take 2.5 mLs by mouth daily as needed (for coughing).   Yes [provider]  ibuprofen (ADVIL) 200 MG tablet Take 400 mg by mouth daily as needed for mild pain (pain score 1-3), headache or fever.   Yes [provider]  isosorbide  mononitrate (IMDUR ) 60 MG 24 hr tablet Take 30 mg by mouth every evening.   Yes [provider]  levothyroxine  (SYNTHROID , LEVOTHROID) 88 MCG tablet Take 88 mcg by mouth daily before breakfast.   Yes [provider]  Multiple Vitamin (MULTI VITAMIN DAILY) TABS Take 1 tablet by mouth daily with breakfast.   Yes [provider]  nitroGLYCERIN  (NITROSTAT ) 0.4 MG SL tablet Place 1 tablet (0.4 mg total) under the tongue every 5 (five) minutes as needed for chest pain. 10/19/22  Yes Conte, Tessa N, PA-C  potassium chloride  (KLOR-CON ) 10 MEQ tablet Take 10 mEq by mouth daily.   Yes [provider]  predniSONE  (DELTASONE ) 5 MG tablet Take 5 mg by mouth daily with breakfast.   Yes [provider]  rosuvastatin  (CRESTOR ) 10 MG tablet Take 1 tablet by mouth 2 times a week. Patient taking differently: Take 10 mg by mouth See admin  instructions.  Take 10 mg by mouth at bedtime on Sundays and Wednesdays 11/09/23  Yes Jerilynn Lamarr HERO, NP  sertraline  (ZOLOFT ) 25 MG tablet Take 25 mg by mouth daily. 02/15/24  Yes [provider]  sodium chloride  (MURO 128) 5 % ophthalmic solution Place 1 drop into the right eye every 4 (four) hours as needed for eye irritation.   Yes [provider]  SYSTANE HYDRATION PF 0.4-0.3 % SOLN Place 1 drop into both eyes 3 (three) times daily as needed (for dryness).   Yes [provider]  triamcinolone cream (KENALOG) 0.1 % Apply 1 Application topically every 2 (two) hours as needed (for itching).   Yes [provider]  isosorbide  mononitrate (IMDUR ) 30 MG 24 hr tablet TAKE 1 TABLET BY MOUTH EVERY EVENING Patient not taking: Reported on 10/29/2024 03/11/24   [name], [name], DO  potassium chloride  SA (K-DUR) 20 MEQ tablet Take 1 tablet (20 mEq total) by mouth daily. Patient not taking: Reported on 10/29/2024 05/09/19   Claudene Victory ORN, MD       The patient is critically ill due to septic shock with MSSA.  Critical care was necessary to treat or prevent imminent or life-threatening deterioration. Critical care time was spent by me on the following activities: development of a treatment plan with the patient and/or surrogate as well as nursing, discussions with consultants, evaluation of the patient's response to treatment, examination of the patient, obtaining a history from the patient or surrogate, ordering and performing treatments and interventions, ordering and review of laboratory studies, ordering and review of radiographic studies, review of telemetry data including pulse oximetry, re-evaluation of patient's condition and participation in multidisciplinary rounds.   I personally spent 35 minutes providing critical care not including any separately billable procedures.  Marny Patch, MD [facility] 11/03/2024 1:09 PM                  [1]   Allergies Allergen Reactions   Ace Inhibitors Other (See Comments)    Severe AKI from dehydration and ARB   Angiotensin Receptor Blockers Other (See Comments)    Severe AKI from dehydration and ARB   Tetanus Toxoid, Adsorbed Swelling and Other (See Comments)    Arm became swollen   Watermelon [Citrullus Vulgaris] Swelling and Other (See Comments)    Tongue became swollen   Iodine Rash   Sulfa Antibiotics Rash   "

## 2024-11-03 NOTE — Progress Notes (Signed)
 PHARMACY CONSULT NOTE FOR:  OUTPATIENT  PARENTERAL ANTIBIOTIC THERAPY (OPAT)  Indication: MSSA bacteremia Regimen: Cefazolin  2 gm IV Q 12 hours  End date: 12/12/24  IV antibiotic discharge orders are pended. To discharging provider:  please sign these orders via discharge navigator,  Select New Orders & click on the button choice - Manage This Unsigned Work.     Thank you for allowing pharmacy to be a part of this patient's care.  Damien Quiet, PharmD, BCPS, BCIDP Infectious Diseases Clinical Pharmacist Phone: [phone] 11/03/2024, 1:21 PM

## 2024-11-03 NOTE — Consult Note (Signed)
 CARDIOLOGY CONSULT NOTE       Patient ID: Jody Shaw MRN: [id number] DOB/AGE: 15-Jul-1941 83 y.o.  Admit date: 10/29/2024 Referring Physician: Overton Faith Primary Physician: Nichole Senior, MD Primary Cardiologist: [place] Reason for Consultation: TEE  Principal Problem:   Severe sepsis without septic shock [facility]) Active Problems:   Chronic acquired lymphedema   Anxiety and depression   Hypothermia   Community acquired pneumonia   Generalized weakness   Acute renal failure superimposed on stage 3b chronic kidney disease (HCC)   Hypotension   MSSA bacteremia   Septic shock (HCC)   Pressure injury of skin   Palliative care encounter   Counseling and coordination of care   Goals of care, counseling/discussion   HPI:  83 y.o. admitted 12/17 with pneumonia and azotemia. History of Giant cell arteritis, hypothyroidism, CAD, GERD, lymphedema, HTN, HLDShe has had cough for a couple of weeks. She has gone down hill since husband passed 2 years ago. Lactic acid 1.6 WBC 13.3  Rx with sepsis protocol. With LR boluses, Rocephin  and Azithromycin  BC;s 12/17 with positive for MRSA. F/U BC;s 12/19 are already negative and patient has not had fevers Reviewed her TTE done 10/31/24 and images somewhat sub optimal with EF 65-70% trivial MR/ MAC with mild MS No obvious vegetations.   Patients son flew in from [place]  last night. Long discussion with him about TEE procedure including need to go to [facility], Propofol  Anestesia, risk of esophageal injury Intubation.   ROS All other systems reviewed and negative except as noted above  Past Medical History:  Diagnosis Date   Coronary artery disease    s/p rotational atherectomy to the LAD in 1996 // Nuc 9/13: no ischemia   Echocardiogram    Echo 9/19:  EF 55-60, normal wall motion, grade 1 diastolic dysfunction, MAC   GERD (gastroesophageal reflux disease)    Giant cell arteritis (HCC)    HLD (hyperlipidemia)    Hypothyroidism    Vision loss of  left eye     Family History  Problem Relation Age of Onset   Heart disease Jody Shaw    Heart disease Jody Shaw     Social History   Socioeconomic History   Marital status: Married    Spouse name: Not on file   Number of children: Not on file   Years of education: Not on file   Highest education level: Not on file  Occupational History   Not on file  Tobacco Use   Smoking status: Former   Smokeless tobacco: Never  Vaping Use   Vaping status: Never Used  Substance and Sexual Activity   Alcohol  use: No   Drug use: No   Sexual activity: Not on file  Other Topics Concern   Not on file  Social History Narrative   Not on file   Social Drivers of Health   Tobacco Use: Medium Risk (03/25/2024)   Patient History    Smoking Tobacco Use: Former    Smokeless Tobacco Use: Never    Passive Exposure: Not on file  Financial Resource Strain: Low Risk (10/16/2023)   Received from [facility]   Overall Financial Resource Strain (CARDIA)    Difficulty of Paying Living Expenses: Not very hard  Food Insecurity: No Food Insecurity (11/01/2024)   Epic    Worried About Running Out of Food in the Last Year: Never true    Ran Out of Food in the Last Year: Never true  Transportation Needs: No Transportation Needs (11/01/2024)  Epic    Lack of Transportation (Medical): No    Lack of Transportation (Non-Medical): No  Physical Activity: Not on file  Stress: Not on file  Social Connections: Socially Isolated (11/02/2024)   Social Connection and Isolation Panel    Frequency of Communication with Friends and Family: More than three times a week    Frequency of Social Gatherings with Friends and Family: Once a week    Attends Religious Services: Never    Database Administrator or Organizations: No    Attends Banker Meetings: Never    Marital Status: Widowed  Intimate Partner Violence: Not At Risk (11/01/2024)   Epic    Fear of Current or Ex-Partner: No    Emotionally Abused: No     Physically Abused: No    Sexually Abused: No  Depression (PHQ2-9): Not on file  Alcohol  Screen: Not on file  Housing: Low Risk (11/01/2024)   Epic    Unable to Pay for Housing in the Last Year: No    Number of Times Moved in the Last Year: 0    Homeless in the Last Year: No  Utilities: Not At Risk (11/01/2024)   Epic    Threatened with loss of utilities: No  Health Literacy: Not on file    Past Surgical History:  Procedure Laterality Date   ARTERY BIOPSY Left 08/03/2017   Procedure: BIOPSY TEMPORAL ARTERY;  Surgeon: Stevie, Herlene Righter, MD;  Location: [facility];  Service: General;  Laterality: Left;     Current Medications[1]  aspirin  EC  81 mg Oral Daily   Chlorhexidine  Gluconate Cloth  6 each Topical Daily   clotrimazole    Topical BID   famotidine   20 mg Oral Daily   fiber  1 packet Oral BID   heparin  injection (subcutaneous)  5,000 Units Subcutaneous Q8H   hydrocortisone  sod succinate (SOLU-CORTEF ) inj  50 mg Intravenous Q12H   [START ON 11/04/2024] hydrocortisone  sod succinate (SOLU-CORTEF ) inj  50 mg Intravenous Daily   levothyroxine   88 mcg Oral Q0600   melatonin  3 mg Oral QHS   nystatin   5 mL Oral QID   [START ON 11/05/2024] predniSONE   5 mg Oral Q breakfast   rosuvastatin   10 mg Oral Once per day on Sunday Wednesday   sertraline   25 mg Oral Daily   umeclidinium-vilanterol  1 puff Inhalation Daily     ceFAZolin  (ANCEF ) IV Stopped (11/02/24 2156)    Physical Exam: Blood pressure (!) 111/46, pulse 84, temperature (!) 97.5 F (36.4 C), temperature source Oral, resp. rate 20, weight 98 kg, SpO2 95%.    Elderly female Decreased BS base No murmur  Abdomen benign LE edema No skin break down   Labs:   Lab Results  Component Value Date   WBC 16.2 (H) 11/03/2024   HGB 7.4 (L) 11/03/2024   HCT 23.5 (L) 11/03/2024   MCV 84.8 11/03/2024   PLT 182 11/03/2024    Recent Labs  Lab 10/31/24 0311 11/01/24 0250 11/03/24 0242  NA 142   < > 143  K 4.9   < > 4.0   CL 113*   < > 112*  CO2 14*   < > 17*  BUN 48*   < > 51*  CREATININE 2.43*   < > 3.05*  CALCIUM  7.5*   < > 8.0*  PROT 5.2*  --   --   BILITOT 0.2  --   --   ALKPHOS 99  --   --  ALT 15  --   --   AST 23  --   --   GLUCOSE 77   < > 100*   < > = values in this interval not displayed.   Lab Results  Component Value Date   CKTOTAL 268 (H) 03/25/2024   No results found for: CHOL No results found for: HDL No results found for: LDLCALC No results found for: TRIG No results found for: CHOLHDL No results found for: LDLDIRECT    Radiology: CT HEAD WO CONTRAST ( ) Result Date: 11/02/2024 EXAM: CT HEAD WITHOUT CONTRAST 11/02/2024 05:22:23 AM TECHNIQUE: CT of the head was performed without the administration of intravenous contrast. Automated exposure control, iterative reconstruction, and/or weight based adjustment of the mA/kV was utilized to reduce the radiation dose to as low as reasonably achievable. COMPARISON: 03/24/2024 CLINICAL HISTORY: Anisocoria FINDINGS: BRAIN AND VENTRICLES: No acute hemorrhage. No evidence of acute infarct. Nonspecific periventricular and subcortical white matter hypoattenuation, consistent with chronic microvascular ischemic changes. Mild parenchymal volume loss. No hydrocephalus. No extra-axial collection. No mass effect or midline shift. Atherosclerotic calcifications of carotid siphons and intracranial vertebral arteries. ORBITS: Bilateral lens replacement noted. SINUSES: Mild mucosal thickening in ethmoid sinuses. Partial opacification of bilateral mastoid air cells. SOFT TISSUES AND SKULL: No acute soft tissue abnormality. No skull fracture. IMPRESSION: 1. No acute intracranial abnormality related to anisocoria. 2. Nonspecific periventricular and subcortical white matter hypoattenuation, consistent with chronic microvascular ischemic changes. 3. Mild parenchymal volume loss. 4. Mild ethmoid sinus mucosal thickening and partial opacification of the  bilateral mastoid air cells. 5. Intracranial atherosclerotic calcifications. Electronically signed by: Evalene Coho MD 11/02/2024 05:47 AM EST RP Workstation: [id number]   ECHOCARDIOGRAM COMPLETE Result Date: 10/31/2024    ECHOCARDIOGRAM REPORT   Patient Name:   Jody Shaw Date of Exam: 10/31/2024 Medical Rec #:  [id number]        Height:       62.0 in Accession #:    [id number]       Weight:       201.9 lb Date of Birth:  1940/11/22        BSA:          1.920 m Patient Age:    83 years         BP:           92/61 mmHg Patient Gender: F                HR:           67 bpm. Exam Location:  Inpatient Procedure: 2D Echo, Cardiac Doppler and Color Doppler (Both Spectral and Color            Flow Doppler were utilized during procedure). Indications:    Bacteremia R78.81  History:        Patient has no prior history of Echocardiogram examinations.                 CAD.  Sonographer:    Jayson Gaskins Referring Phys: [id number] TRUNG T VU IMPRESSIONS  1. Left ventricular ejection fraction, by estimation, is 65 to 70%. The left ventricle has normal function. The left ventricle has no regional wall motion abnormalities. Left ventricular diastolic parameters are consistent with Grade II diastolic dysfunction (pseudonormalization). Elevated left atrial pressure.  2. Right ventricular systolic function is normal. The right ventricular size is normal.  3. The mitral valve is degenerative. Trivial mitral valve regurgitation. Mild to moderate mitral stenosis. Moderate MS by gradients (  MG at 65bpm), mild by MVA (1.9cm^2 by continuity equation)  4. The aortic valve was not well visualized. Aortic valve regurgitation is not visualized. No aortic stenosis is present. Conclusion(s)/Recommendation(s): No evidence of valvular vegetations on this transthoracic echocardiogram, but tehcnically difficult study. Consider a transesophageal echocardiogram to exclude infective endocarditis if clinically indicated. FINDINGS  Left  Ventricle: Left ventricular ejection fraction, by estimation, is 65 to 70%. The left ventricle has normal function. The left ventricle has no regional wall motion abnormalities. The left ventricular internal cavity size was small. There is no left ventricular hypertrophy. Left ventricular diastolic parameters are consistent with Grade II diastolic dysfunction (pseudonormalization). Elevated left atrial pressure. Right Ventricle: The right ventricular size is normal. No increase in right ventricular wall thickness. Right ventricular systolic function is normal. Left Atrium: Left atrial size was normal in size. Right Atrium: Right atrial size was normal in size. Pericardium: There is no evidence of pericardial effusion. Mitral Valve: The mitral valve is degenerative in appearance. Moderate mitral annular calcification. Trivial mitral valve regurgitation. Mild to moderate mitral valve stenosis. MV peak gradient, 10.5 mmHg. The mean mitral valve gradient is 6.0 mmHg. Tricuspid Valve: The tricuspid valve is normal in structure. Tricuspid valve regurgitation is trivial. Aortic Valve: The aortic valve was not well visualized. Aortic valve regurgitation is not visualized. No aortic stenosis is present. Aortic valve mean gradient measures 5.0 mmHg. Aortic valve peak gradient measures 8.9 mmHg. Aortic valve area, by VTI measures 1.83 cm. Pulmonic Valve: The pulmonic valve was not well visualized. Pulmonic valve regurgitation is trivial. Aorta: The aortic root was not well visualized. IAS/Shunts: The interatrial septum was not well visualized.  LEFT VENTRICLE PLAX 2D LVIDd:         3.30 cm   Diastology LVIDs:         2.30 cm   LV e' medial:    7.18 cm/s LV PW:         0.90 cm   LV E/e' medial:  18.9 LV IVS:        0.90 cm   LV e' lateral:   7.29 cm/s LVOT diam:     1.70 cm   LV E/e' lateral: 18.7 LV SV:         73 LV SV Index:   38 LVOT Area:     2.27 cm LV IVRT:       102 msec  RIGHT VENTRICLE RV S prime:     9.25 cm/s  TAPSE (M-mode): 1.5 cm LEFT ATRIUM             Index        RIGHT ATRIUM           Index LA Vol (A2C):   38.8 ml 20.21 ml/m  RA Area:     13.30 cm LA Vol (A4C):   43.4 ml 22.61 ml/m  RA Volume:   28.30 ml  14.74 ml/m LA Biplane Vol: 41.4 ml 21.57 ml/m  AORTIC VALVE AV Area (Vmax):    1.95 cm AV Area (Vmean):   1.72 cm AV Area (VTI):     1.83 cm AV Vmax:           149.00 cm/s AV Vmean:          107.000 cm/s AV VTI:            0.400 m AV Peak Grad:      8.9 mmHg AV Mean Grad:      5.0 mmHg LVOT Vmax:  128.00 cm/s LVOT Vmean:        81.200 cm/s LVOT VTI:          0.323 m LVOT/AV VTI ratio: 0.81 MITRAL VALVE                TRICUSPID VALVE MV Area (PHT): 2.97 cm     TR Peak grad:   28.9 mmHg MV Area VTI:   1.86 cm     TR Vmax:        269.00 cm/s MV Peak grad:  10.5 mmHg MV Mean grad:  6.0 mmHg     SHUNTS MV Vmax:       1.62 m/s     Systemic VTI:  0.32 m MV Vmean:      112.0 cm/s   Systemic Diam: 1.70 cm MV E velocity: 136.00 cm/s MV A velocity: 159.00 cm/s MV E/A ratio:  0.86 Lonni Nanas MD Electronically signed by Lonni Nanas MD Signature Date/Time: 10/31/2024/10:51:09 AM    Final    CT Chest Wo Contrast Result Date: 10/29/2024 CLINICAL DATA:  Nontraumatic chest wall pain. Concern for inflammatory/infectious etiology. EXAM: CT CHEST WITHOUT CONTRAST TECHNIQUE: Multidetector CT imaging of the chest was performed following the standard protocol without IV contrast. RADIATION DOSE REDUCTION: This exam was performed according to the departmental dose-optimization program which includes automated exposure control, adjustment of the mA and/or kV according to patient size and/or use of iterative reconstruction technique. COMPARISON:  Chest radiograph dated 10/29/2024. FINDINGS: Evaluation of this exam is limited in the absence of intravenous contrast. Cardiovascular: There is no cardiomegaly or pericardial effusion. There is coronary vascular calcification and calcification of the mitral  annulus. Moderate atherosclerotic calcification of the thoracic aorta. No aneurysmal dilatation. The central pulmonary arteries are grossly unremarkable. Mediastinum/Nodes: No hilar or mediastinal adenopathy. Moderate size hiatal hernia. The esophagus is grossly unremarkable no mediastinal fluid collection. Lungs/Pleura: Bibasilar subpleural atelectasis. Biapical subpleural linear scarring. No consolidative changes. There is no pleural effusion pneumothorax. The central airways are patent. Upper Abdomen: No acute abnormality. Musculoskeletal: Osteopenia with degenerative changes of the spine. No acute osseous pathology. No fluid collection. IMPRESSION: 1. No acute intrathoracic pathology. 2. Moderate size hiatal hernia. 3.  Aortic Atherosclerosis (ICD10-I70.0). Electronically Signed   By: Vanetta Chou M.D.   On: 10/29/2024 17:01   DG Chest Port 1 View Result Date: 10/29/2024 EXAM: 1 VIEW(S) XRAY OF THE CHEST 10/29/2024 02:46:00 PM COMPARISON: Comparison with 03/24/2024. CLINICAL HISTORY: Questionable sepsis - evaluate for abnormality. FINDINGS: LUNGS AND PLEURA: Shallow inspiration with elevation of the right hemidiaphragm. Mild linear atelectasis or scarring in the lung bases. No airspace disease or consolidation. No pleural effusion or pneumothorax. HEART AND MEDIASTINUM: Moderate-sized hiatal hernia behind the heart. Heart size and pulmonary vascularity are normal. Mediastinal contours appear intact. BONES AND SOFT TISSUES: Degenerative changes in the spine and shoulders. No acute osseous abnormality. IMPRESSION: 1. No acute cardiopulmonary abnormality. Electronically signed by: Elsie Gravely MD 10/29/2024 03:17 PM EST RP Workstation: [id number]    EKG: SR rate 70 read as flutter but not    ASSESSMENT AND PLAN:   Bacteremia:  ? Etiology CT chest no acue intrathoracic pathology urine culture not positive. TTE with no obvious SBE and BC;s already cleared after 2-3 days of antibiotics Now on Ancef .  Patient clearly does not want to have TEE. I think the yield for SBE/Vegetations would be quite low. Would Rx for 2 weeks antibiotics and see how she does clinically as BC;s have already cleared Son now understands  risks of procedure and ok to TEE not being done Note she has been seen by palliative care as well  CAD:  History of LAD atherectomy. Myovue 2013 negative for ischemia No angina ECG normal  ( read by computer as flutter but not ) Stable Hypothyroid on synthroid  replacement TSH normal 0.896 HLD:  in setting of cAD continue crestor    Signed: Maude Emmer 11/03/2024, 9:46 AM      [1]  Current Facility-Administered Medications:    acetaminophen  (TYLENOL ) tablet 650 mg, 650 mg, Oral, Q6H PRN, 650 mg at 10/31/24 0002 **OR** acetaminophen  (TYLENOL ) suppository 650 mg, 650 mg, Rectal, Q6H PRN, Amponsah, Prosper M, MD   artificial tears ophthalmic solution 1 drop, 1 drop, Both Eyes, TID PRN, Lou, Claretta HERO, MD   aspirin  EC tablet 81 mg, 81 mg, Oral, Daily, Payne, John D, PA-C, 81 mg at 11/02/24 9050   bisacodyl  (DULCOLAX) EC tablet 5 mg, 5 mg, Oral, Daily PRN, Lou Claretta HERO, MD   ceFAZolin  (ANCEF ) IVPB 2g/100 mL premix, 2 g, Intravenous, Q12H, Alekh, Kshitiz, MD, Stopped at 11/02/24 2156   Chlorhexidine  Gluconate Cloth 2 % PADS 6 each, 6 each, Topical, Daily, Alekh, Kshitiz, MD, 6 each at 11/02/24 1606   clotrimazole  (LOTRIMIN ) 1 % cream, , Topical, BID, Payne, John D, PA-C, 1 Application at 11/02/24 2112   famotidine  (PEPCID ) tablet 20 mg, 20 mg, Oral, Daily, Adrien Guan, Tamala, MD   fiber (NUTRISOURCE FIBER) 1 packet, 1 packet, Oral, BID, Payne, John D, PA-C, 1 packet at 11/02/24 2115   guaiFENesin  (ROBITUSSIN) 100 MG/5ML liquid 5 mL, 5 mL, Oral, Q4H PRN, Lou Claretta HERO, MD, 5 mL at 11/03/24 0212   heparin  injection 5,000 Units, 5,000 Units, Subcutaneous, Q8H, Payne, John D, PA-C, 5,000 Units at 11/03/24 9361   hydrocortisone  sodium succinate  (SOLU-CORTEF ) 100 MG  injection 50 mg, 50 mg, Intravenous, Q12H, Adrien Guan, Tamala, MD, 50 mg at 11/03/24 0637   [START ON 11/04/2024] hydrocortisone  sodium succinate  (SOLU-CORTEF ) 100 MG injection 50 mg, 50 mg, Intravenous, Daily, Adrien Guan, Tamala, MD   ipratropium-albuterol  (DUONEB) 0.5-2.5 (3) MG/3ML nebulizer solution 3 mL, 3 mL, Nebulization, Q4H PRN, Pawar, Rahul, MD, 3 mL at 11/02/24 0008   levothyroxine  (SYNTHROID ) tablet 88 mcg, 88 mcg, Oral, Q0600, Lou Claretta HERO, MD, 88 mcg at 11/03/24 9362   loperamide  (IMODIUM ) capsule 2 mg, 2 mg, Oral, Q6H PRN, Cheryle, Kshitiz, MD   melatonin tablet 3 mg, 3 mg, Oral, QHS, Adrien Guan, Tamala, MD, 3 mg at 11/02/24 2115   nystatin  (MYCOSTATIN ) 100000 UNIT/ML suspension 500,000 Units, 5 mL, Oral, QID, Sanchez Fernandez, Tamala, MD, 500,000 Units at 11/02/24 1406   ondansetron  (ZOFRAN ) tablet 4 mg, 4 mg, Oral, Q6H PRN **OR** ondansetron  (ZOFRAN ) injection 4 mg, 4 mg, Intravenous, Q6H PRN, Lou Claretta HERO, MD, 4 mg at 10/30/24 9490   Oral care mouth rinse, 15 mL, Mouth Rinse, PRN, Cheryle, Kshitiz, MD   NOREEN ON 11/05/2024] predniSONE  (DELTASONE ) tablet 5 mg, 5 mg, Oral, Q breakfast, Adrien Guan, Tamala, MD   rosuvastatin  (CRESTOR ) tablet 10 mg, 10 mg, Oral, Once per day on Sunday Wednesday, Amponsah, Prosper M, MD, 10 mg at 11/02/24 2115   senna-docusate (Senokot-S) tablet 1 tablet, 1 tablet, Oral, QHS PRN, Lou Claretta HERO, MD, 1 tablet at 10/31/24 0002   sertraline  (ZOLOFT ) tablet 25 mg, 25 mg, Oral, Daily, Lou Claretta HERO, MD, 25 mg at 11/02/24 0949   umeclidinium-vilanterol (ANORO ELLIPTA ) 62.5-25 MCG/ACT 1 puff, 1 puff, Inhalation, Daily, Pawar, Rahul, MD, 1  puff at 11/03/24 [phone]

## 2024-11-03 NOTE — Progress Notes (Signed)
 "        [facility]  Date of Admission:  10/29/2024     Lines:  Peripheral iv's Urethral catheter 12/17-c   Abx: 12/18-c cefazolin    12/17-18 ceftriaxone jennell           Other: 12/19-c stress dose hydrocortisone   Chronic prednisone  outpatient                                                    Assessment: 83 yo female cad, hypothyroidism, hx giant cell arteritis, gerd, chronic venous stasis, sent 12/17 from pcp for cough and acute on chronic cr elevation, found to have mssa bacteremia and ams   Chest ct clear   12/17 respiratory viral pcr negative 12/17 bcx returned quickly with mssa 12/18 ucx in process   Cr unclear baseline given how much variation this year but 2.8 from 1.7 (back in 03/2024)   Currently altered and will need to further evaluate that along with sites of metastatic involvement once mentation improved   No cardiac device on imaging and no obvious prosthetics otherwise Bilateral venous stasis ?portal of entry     This seems to be all mssa sepsis and will adjust abx to reflect such     ------------- 10/31/24 id assessment Still hypothermic at times. Stress dose steroid started today 12/19 in setting of chronic prednisone  use for giant cell arteritis (home dose 5 mg daily)  12/18 ucx <10k insignificant growth  Bp soft suspect sepsis and adrenal issue. I do not appreciate cardiogenic shock on exam  Legs picture appear to suggest stasis dermatitis changes. Left heel tenderness and there is an echymosis there. Will keep eye on for cellulitis/nec fasc process  Mentating better than yesterday.     Pulm consulted by primary team for low bp   Diarrhea and primary team had ordered gi workup    ------ 12/22 id assessment Septic shock resolved -- hc stress dose tapering Aki remains and probably plateauing in rise Repeat bcx negative Family doesn't want to do tee  12/19 tte technically difficult study but no  obvious vegetation   Given that tee will not be done, will plan 6 weeks abx therapy to cover empirically for IE.   It would be at least 4 weeks without IE or bone joint infection, but 6 weeks or more if present. And family would just like to do 6 weeks abx   Plan: Continue cefazolin  Plan 6 weeks from 12/19; see opat below Maintain standard isolation precaution Given her ckd I have asked renal team to comment on possibility of picc line for abx.  Discussed with primary team    OPAT Orders Discharge antibiotics to be given via PICC line Discharge antibiotics: Cefazolin    Duration: 6 weeks from 12/19 End Date: 12/12/24  [facility] Care Per Protocol:  Home health RN for IV administration and teaching; PICC line care and labs.    Labs weekly while on IV antibiotics: _x_ CBC with differential __ BMP _x_ CMP __ CRP __ ESR __ Vancomycin  trough __ CK  __ Please pull PIC at completion of IV antibiotics __ Please leave PIC in place until doctor has seen patient or been notified  Fax weekly labs to [phone]  Clinic Follow Up Appt: 1/21 @ 9am  @  [facility] [address], [place],  [place] [address] Phone: [phone]   Principal Problem:   Severe sepsis without septic shock (HCC) Active Problems:   Chronic acquired lymphedema   Anxiety and depression   Hypothermia   Community acquired pneumonia   Generalized weakness   Acute renal failure superimposed on stage 3b chronic kidney disease (HCC)   Hypotension   MSSA bacteremia   Septic shock (HCC)   Pressure injury of skin   Palliative care encounter   Counseling and coordination of care   Goals of care, counseling/discussion   Allergies[1]  Scheduled Meds:  aspirin  EC  81 mg Oral Daily   Chlorhexidine  Gluconate Cloth  6 each Topical Daily   clotrimazole    Topical BID   famotidine   20 mg Oral Daily   fiber  1 packet Oral BID   heparin  injection (subcutaneous)  5,000 Units Subcutaneous Q8H    hydrocortisone  sod succinate (SOLU-CORTEF ) inj  50 mg Intravenous Q12H   [START ON 11/04/2024] hydrocortisone  sod succinate (SOLU-CORTEF ) inj  50 mg Intravenous Daily   levothyroxine   88 mcg Oral Q0600   melatonin  3 mg Oral QHS   nystatin   5 mL Oral QID   [START ON 11/05/2024] predniSONE   5 mg Oral Q breakfast   rosuvastatin   10 mg Oral Once per day on Sunday Wednesday   sertraline   25 mg Oral Daily   umeclidinium-vilanterol  1 puff Inhalation Daily   Continuous Infusions:   ceFAZolin  (ANCEF ) IV     PRN Meds:.acetaminophen  **OR** acetaminophen , artificial tears, bisacodyl , guaiFENesin , ipratropium-albuterol , loperamide , ondansetron  **OR** ondansetron  (ZOFRAN ) IV, mouth rinse, senna-docusate   SUBJECTIVE: Resolved shock  Alert No focal pain Left foot pain better    Review of Systems: ROS All other ROS was negative, except mentioned above     OBJECTIVE: Vitals:   11/03/24 1200 11/03/24 1300 11/03/24 1400 11/03/24 1500  BP: (!) 106/33 (!) 91/46 (!) 91/37 (!) 109/55  Pulse: 65 68 73 75  Resp: 14 14 12 13   Temp:      TempSrc:      SpO2: 98% 96% 98% 97%  Weight:      Height:       Body mass index is 39.52 kg/m.  Physical Exam General/constitutional: no distress, pleasant HEENT: Normocephalic, PER, Conj Clear, EOMI, Oropharynx clear Neck supple CV: rrr no mrg Lungs: clear to auscultation, normal respiratory effort Abd: Soft, Nontender Ext/skin: no rash; bilateral left lower ext compression dressing intact Neuro: nonfocal MSK: no peripheral joint swelling/tenderness/warmth; back spines nontender    Lab Results Lab Results  Component Value Date   WBC 16.2 (H) 11/03/2024   HGB 7.4 (L) 11/03/2024   HCT 23.5 (L) 11/03/2024   MCV 84.8 11/03/2024   PLT 182 11/03/2024    Lab Results  Component Value Date   CREATININE 3.05 (H) 11/03/2024   BUN 51 (H) 11/03/2024   NA 143 11/03/2024   K 4.0 11/03/2024   CL 112 (H) 11/03/2024   CO2 17 (L) 11/03/2024    Lab  Results  Component Value Date   ALT 15 10/31/2024   AST 23 10/31/2024   ALKPHOS 99 10/31/2024   BILITOT 0.2 10/31/2024      Microbiology: Recent Results (from the past 240 hours)  Resp panel by RT-PCR (RSV, Flu A&B, Covid)     Status: None   Collection Time: 10/29/24  2:31 PM   Specimen: Nasal Swab  Result Value Ref Range Status   SARS Coronavirus 2 by RT PCR NEGATIVE NEGATIVE Final  Comment: (NOTE) SARS-CoV-2 target nucleic acids are NOT DETECTED.  The SARS-CoV-2 RNA is generally detectable in upper respiratory specimens during the acute phase of infection. The lowest concentration of SARS-CoV-2 viral copies this assay can detect is 138 copies/mL. A negative result does not preclude SARS-Cov-2 infection and should not be used as the sole basis for treatment or other patient management decisions. A negative result may occur with  improper specimen collection/handling, submission of specimen other than nasopharyngeal swab, presence of viral mutation(s) within the areas targeted by this assay, and inadequate number of viral copies(<138 copies/mL). A negative result must be combined with clinical observations, patient history, and epidemiological information. The expected result is Negative.  Fact Sheet for Patients:  bloggercourse.com  Fact Sheet for Healthcare Providers:  seriousbroker.it  This test is no t yet approved or cleared by the [place]  FDA and  has been authorized for detection and/or diagnosis of SARS-CoV-2 by FDA under an Emergency Use Authorization (EUA). This EUA will remain  in effect (meaning this test can be used) for the duration of the COVID-19 declaration under Section 564(b)(1) of the Act, 21 U.S.C.section 360bbb-3(b)(1), unless the authorization is terminated  or revoked sooner.       Influenza A by PCR NEGATIVE NEGATIVE Final   Influenza B by PCR NEGATIVE NEGATIVE Final    Comment:  (NOTE) The Xpert Xpress SARS-CoV-2/FLU/RSV plus assay is intended as an aid in the diagnosis of influenza from Nasopharyngeal swab specimens and should not be used as a sole basis for treatment. Nasal washings and aspirates are unacceptable for Xpert Xpress SARS-CoV-2/FLU/RSV testing.  Fact Sheet for Patients: bloggercourse.com  Fact Sheet for Healthcare Providers: seriousbroker.it  This test is not yet approved or cleared by the [place]  FDA and has been authorized for detection and/or diagnosis of SARS-CoV-2 by FDA under an Emergency Use Authorization (EUA). This EUA will remain in effect (meaning this test can be used) for the duration of the COVID-19 declaration under Section 564(b)(1) of the Act, 21 U.S.C. section 360bbb-3(b)(1), unless the authorization is terminated or revoked.     Resp Syncytial Virus by PCR NEGATIVE NEGATIVE Final    Comment: (NOTE) Fact Sheet for Patients: bloggercourse.com  Fact Sheet for Healthcare Providers: seriousbroker.it  This test is not yet approved or cleared by the [place]  FDA and has been authorized for detection and/or diagnosis of SARS-CoV-2 by FDA under an Emergency Use Authorization (EUA). This EUA will remain in effect (meaning this test can be used) for the duration of the COVID-19 declaration under Section 564(b)(1) of the Act, 21 U.S.C. section 360bbb-3(b)(1), unless the authorization is terminated or revoked.  Performed at [facility], [address], [place], [place] [address]   Respiratory (~20 pathogens) panel by PCR     Status: None   Collection Time: 10/29/24  2:31 PM   Specimen: Nasopharyngeal Swab; Respiratory  Result Value Ref Range Status   Adenovirus NOT DETECTED NOT DETECTED Final   Coronavirus 229E NOT DETECTED NOT DETECTED Final    Comment: (NOTE) The Coronavirus on the Respiratory  Panel, DOES NOT test for the novel  Coronavirus (2019 nCoV)    Coronavirus HKU1 NOT DETECTED NOT DETECTED Final   Coronavirus NL63 NOT DETECTED NOT DETECTED Final   Coronavirus OC43 NOT DETECTED NOT DETECTED Final   Metapneumovirus NOT DETECTED NOT DETECTED Final   Rhinovirus / Enterovirus NOT DETECTED NOT DETECTED Final   Influenza A NOT DETECTED NOT DETECTED Final   Influenza B NOT  DETECTED NOT DETECTED Final   Parainfluenza Virus 1 NOT DETECTED NOT DETECTED Final   Parainfluenza Virus 2 NOT DETECTED NOT DETECTED Final   Parainfluenza Virus 3 NOT DETECTED NOT DETECTED Final   Parainfluenza Virus 4 NOT DETECTED NOT DETECTED Final   Respiratory Syncytial Virus NOT DETECTED NOT DETECTED Final   Bordetella pertussis NOT DETECTED NOT DETECTED Final   Bordetella Parapertussis NOT DETECTED NOT DETECTED Final   Chlamydophila pneumoniae NOT DETECTED NOT DETECTED Final   Mycoplasma pneumoniae NOT DETECTED NOT DETECTED Final    Comment: Performed at [facility], [address], [place], [place] [address]  Blood Culture (routine x 2)     Status: Abnormal   Collection Time: 10/29/24  3:25 PM   Specimen: BLOOD  Result Value Ref Range Status   Specimen Description   Final    BLOOD SITE NOT SPECIFIED Performed at [facility], [address] [place], [place] [address]    Special Requests   Final    BOTTLES DRAWN AEROBIC AND ANAEROBIC Blood Culture results may not be optimal due to an inadequate volume of blood received in culture bottles Performed at [facility], [address], [place], [place] [address]    Culture  Setup Time   Final    GRAM POSITIVE COCCI IN CLUSTERS IN BOTH AEROBIC AND ANAEROBIC BOTTLES CRITICAL VALUE NOTED.  VALUE IS CONSISTENT WITH PREVIOUSLY REPORTED AND CALLED VALUE.    Culture (A)  Final    STAPHYLOCOCCUS AUREUS SUSCEPTIBILITIES PERFORMED ON PREVIOUS CULTURE WITHIN THE LAST 5 DAYS. Performed at [facility],  [address], [place], [place] [address]    Report Status 11/01/2024 FINAL  Final  Blood Culture (routine x 2)     Status: Abnormal   Collection Time: 10/29/24  3:40 PM   Specimen: BLOOD  Result Value Ref Range Status   Specimen Description   Final    BLOOD SITE NOT SPECIFIED Performed at [facility], [address], [place], [place] [address]    Special Requests   Final    BOTTLES DRAWN AEROBIC AND ANAEROBIC Blood Culture results may not be optimal due to an inadequate volume of blood received in culture bottles Performed at [facility], [address], [place], [place] [address]    Culture  Setup Time   Final    GRAM POSITIVE COCCI IN CLUSTERS IN BOTH AEROBIC AND ANAEROBIC BOTTLES CRITICAL RESULT CALLED TO, READ BACK BY AND VERIFIED WITH: MAYA LOUANN LABOR 1327 [id number] FCP Performed at [facility], [address], [place], [place] [address]    Culture STAPHYLOCOCCUS AUREUS (A)  Final   Report Status 11/01/2024 FINAL  Final   Organism ID, Bacteria STAPHYLOCOCCUS AUREUS  Final      Susceptibility   Staphylococcus aureus - MIC*    CIPROFLOXACIN <=0.5 SENSITIVE Sensitive     ERYTHROMYCIN >=8 RESISTANT Resistant     GENTAMICIN >=16 RESISTANT Resistant     OXACILLIN 0.5 SENSITIVE Sensitive     TETRACYCLINE <=1 SENSITIVE Sensitive     VANCOMYCIN  1 SENSITIVE Sensitive     TRIMETH/SULFA <=10 SENSITIVE Sensitive     CLINDAMYCIN <=0.25 SENSITIVE Sensitive     RIFAMPIN <=0.5 SENSITIVE Sensitive     Inducible Clindamycin NEGATIVE Sensitive     LINEZOLID 2 SENSITIVE Sensitive     * STAPHYLOCOCCUS AUREUS  Blood Culture ID Panel (Reflexed)     Status: Abnormal   Collection Time: 10/29/24  3:40 PM  Result Value Ref Range Status   Enterococcus faecalis NOT DETECTED NOT DETECTED Final   Enterococcus Faecium NOT DETECTED NOT DETECTED Final   Listeria monocytogenes NOT DETECTED NOT DETECTED Final   Staphylococcus species DETECTED (A) NOT DETECTED  Final    Comment: CRITICAL RESULT CALLED TO, READ BACK BY AND VERIFIED WITH: PHARMD UTOMWEN, A 1327 [id number] FCP    Staphylococcus aureus (BCID) DETECTED (A) NOT DETECTED Final    Comment: CRITICAL RESULT CALLED TO, READ BACK BY AND VERIFIED WITH: PHARMD UTOMWEN, A 1327 [id number] FCP    Staphylococcus epidermidis NOT DETECTED NOT DETECTED Final   Staphylococcus lugdunensis NOT DETECTED NOT DETECTED Final   Streptococcus species NOT DETECTED NOT DETECTED Final   Streptococcus agalactiae NOT DETECTED NOT DETECTED Final   Streptococcus pneumoniae NOT DETECTED NOT DETECTED Final   Streptococcus pyogenes NOT DETECTED NOT DETECTED Final   A.calcoaceticus-baumannii NOT DETECTED NOT DETECTED Final   Bacteroides fragilis NOT DETECTED NOT DETECTED Final   Enterobacterales NOT DETECTED NOT DETECTED Final   Enterobacter cloacae complex NOT DETECTED NOT DETECTED Final   Escherichia coli NOT DETECTED NOT DETECTED Final   Klebsiella aerogenes NOT DETECTED NOT DETECTED Final   Klebsiella oxytoca NOT DETECTED NOT DETECTED Final   Klebsiella pneumoniae NOT DETECTED NOT DETECTED Final   Proteus species NOT DETECTED NOT DETECTED Final   Salmonella species NOT DETECTED NOT DETECTED Final   Serratia marcescens NOT DETECTED NOT DETECTED Final   Haemophilus influenzae NOT DETECTED NOT DETECTED Final   Neisseria meningitidis NOT DETECTED NOT DETECTED Final   Pseudomonas aeruginosa NOT DETECTED NOT DETECTED Final   Stenotrophomonas maltophilia NOT DETECTED NOT DETECTED Final   Candida albicans NOT DETECTED NOT DETECTED Final   Candida auris NOT DETECTED NOT DETECTED Final   Candida glabrata NOT DETECTED NOT DETECTED Final   Candida krusei NOT DETECTED NOT DETECTED Final   Candida parapsilosis NOT DETECTED NOT DETECTED Final   Candida tropicalis NOT DETECTED NOT DETECTED Final   Cryptococcus neoformans/gattii NOT DETECTED NOT DETECTED Final   Meth resistant mecA/C and MREJ NOT DETECTED NOT DETECTED Final     Comment: Performed at [facility], [address], [place], [place] [address]  MRSA Next Gen by PCR, Nasal     Status: None   Collection Time: 10/29/24 11:12 PM   Specimen: Nasal Mucosa; Nasal Swab  Result Value Ref Range Status   MRSA by PCR Next Gen NOT DETECTED NOT DETECTED Final    Comment: (NOTE) The GeneXpert MRSA Assay (FDA approved for NASAL specimens only), is one component of a comprehensive MRSA colonization surveillance program. It is not intended to diagnose MRSA infection nor to guide or monitor treatment for MRSA infections. Test performance is not FDA approved in patients less than 49 years old. Performed at [facility], [address], [place], [place] [address]   Urine Culture     Status: Abnormal   Collection Time: 10/30/24  4:00 AM   Specimen: Urine, Random  Result Value Ref Range Status   Specimen Description   Final    URINE, RANDOM Performed at [facility], [address] [place], [place] [address]    Special Requests   Final    NONE Reflexed from [id number] Performed at [facility], [address] [place], [place] [address]    Culture (A)  Final    <10,000 COLONIES/mL INSIGNIFICANT GROWTH Performed at [facility], [address] [place], [place] [address]  Report Status 10/31/2024 FINAL  Final  C Difficile Quick Screen w PCR reflex     Status: None   Collection Time: 10/30/24  4:44 PM   Specimen: STOOL  Result Value Ref Range Status   C Diff antigen NEGATIVE NEGATIVE Final   C Diff toxin NEGATIVE NEGATIVE Final   C Diff interpretation No C. difficile detected.  Final    Comment: Performed at [facility], [address] [place], [place] [address]  Gastrointestinal Panel by PCR , Stool     Status: None   Collection Time: 10/30/24  4:45 PM   Specimen: STOOL  Result Value Ref Range Status   Campylobacter species NOT DETECTED NOT DETECTED Final   Plesimonas  shigelloides NOT DETECTED NOT DETECTED Final   Salmonella species NOT DETECTED NOT DETECTED Final   Yersinia enterocolitica NOT DETECTED NOT DETECTED Final   Vibrio species NOT DETECTED NOT DETECTED Final   Vibrio cholerae NOT DETECTED NOT DETECTED Final   Enteroaggregative E coli (EAEC) NOT DETECTED NOT DETECTED Final   Enteropathogenic E coli (EPEC) NOT DETECTED NOT DETECTED Final   Enterotoxigenic E coli (ETEC) NOT DETECTED NOT DETECTED Final   Shiga like toxin producing E coli (STEC) NOT DETECTED NOT DETECTED Final   Shigella/Enteroinvasive E coli (EIEC) NOT DETECTED NOT DETECTED Final   Cryptosporidium NOT DETECTED NOT DETECTED Final   Cyclospora cayetanensis NOT DETECTED NOT DETECTED Final   Entamoeba histolytica NOT DETECTED NOT DETECTED Final   Giardia lamblia NOT DETECTED NOT DETECTED Final   Adenovirus F40/41 NOT DETECTED NOT DETECTED Final   Astrovirus NOT DETECTED NOT DETECTED Final   Norovirus GI/GII NOT DETECTED NOT DETECTED Final   Rotavirus A NOT DETECTED NOT DETECTED Final   Sapovirus (I, II, IV, and V) NOT DETECTED NOT DETECTED Final    Comment: Performed at [facility], [address] [place], [place] [address]  Culture, blood (Routine X 2) w Reflex to ID Panel     Status: None (Preliminary result)   Collection Time: 10/31/24  3:11 AM   Specimen: BLOOD  Result Value Ref Range Status   Specimen Description   Final    BLOOD SITE NOT SPECIFIED Performed at [facility], [address], [place], [place] [address]    Special Requests   Final    BOTTLES DRAWN AEROBIC ONLY Blood Culture results may not be optimal due to an inadequate volume of blood received in culture bottles Performed at [facility], [address], [place], [place] [address]    Culture   Final    NO GROWTH 3 DAYS Performed at [facility], [address], [place], [place] [address]    Report Status PENDING  Incomplete  Culture, blood (Routine X 2) w  Reflex to ID Panel     Status: None (Preliminary result)   Collection Time: 10/31/24  3:11 AM   Specimen: BLOOD LEFT HAND  Result Value Ref Range Status   Specimen Description   Final    BLOOD LEFT HAND Performed at [facility], [address], [place], [place] [address]    Special Requests   Final    BOTTLES DRAWN AEROBIC ONLY Blood Culture results may not be optimal due to an inadequate volume of blood received in culture bottles Performed at [facility], [address], [place], [place] [address]    Culture   Final    NO GROWTH 3 DAYS Performed at [facility], [address], [place], [place] [address]    Report Status PENDING  Incomplete     Serology:   Imaging: If present, new imagings (plain films, ct scans, and mri) have been personally visualized and interpreted; radiology reports have been reviewed. Decision making incorporated into the Impression / Recommendations.  12/19 tte  1. Left ventricular ejection fraction, by estimation, is 65 to 70%. The  left ventricle has normal function. The left ventricle has no regional  wall motion abnormalities. Left ventricular diastolic parameters are  consistent with Grade II diastolic  dysfunction (pseudonormalization). Elevated left atrial pressure.   2. Right ventricular systolic function is normal. The right ventricular  size is normal.   3. The mitral valve is degenerative. Trivial mitral valve regurgitation.  Mild to moderate mitral stenosis. Moderate MS by gradients (MG at  65bpm), mild by MVA (1.9cm^2 by continuity equation)   4. The aortic valve was not well visualized. Aortic valve regurgitation  is not visualized. No aortic stenosis is present.   12/17 ct chest without contrast 1. No acute intrathoracic pathology. 2. Moderate size hiatal hernia. 3.  Aortic Atherosclerosis  Constance ONEIDA Passer, MD [facility] [phone] pager     11/03/2024, 4:01 PM      [1]  Allergies Allergen Reactions   Ace Inhibitors Other (See Comments)    Severe AKI from dehydration and ARB   Angiotensin Receptor Blockers Other (See Comments)    Severe AKI from dehydration and ARB   Tetanus Toxoid, Adsorbed Swelling and Other (See Comments)    Arm became swollen   Watermelon [Citrullus Vulgaris] Swelling and Other (See Comments)    Tongue became swollen   Iodine Rash   Sulfa Antibiotics Rash   "

## 2024-11-03 NOTE — Progress Notes (Deleted)
 PHARMACY NOTE:  ANTIMICROBIAL RENAL DOSAGE ADJUSTMENT  Current antimicrobial regimen includes a mismatch between antimicrobial dosage and estimated renal function.  As per policy approved by the Pharmacy & Therapeutics and Medical Executive Committees, the antimicrobial dosage will be adjusted accordingly.  Current antimicrobial dosage:  Cefepime 2g IV q12h   Indication: MSSA Bacteremia   Renal Function:  Estimated Creatinine Clearance: 14.8 mL/min (A) (by C-G formula based on SCr of 3.05 mg/dL (H)). []      On intermittent HD, scheduled: []      On CRRT    Antimicrobial dosage has been changed to:  Cefepime 2gIV q24h  Additional comments:   Thank you for allowing pharmacy to be a part of this patient's care.  Marget Hench, [facility] 11/03/2024 10:33 AM

## 2024-11-04 ENCOUNTER — Other Ambulatory Visit: Payer: Self-pay

## 2024-11-04 ENCOUNTER — Encounter (HOSPITAL_COMMUNITY): Payer: Self-pay | Admitting: Internal Medicine

## 2024-11-04 DIAGNOSIS — A419 Sepsis, unspecified organism: Secondary | ICD-10-CM | POA: Diagnosis not present

## 2024-11-04 DIAGNOSIS — R6521 Severe sepsis with septic shock: Secondary | ICD-10-CM

## 2024-11-04 LAB — BASIC METABOLIC PANEL WITH GFR
Anion gap: 14 (ref 5–15)
BUN: 55 mg/dL — ABNORMAL HIGH (ref 8–23)
CO2: 16 mmol/L — ABNORMAL LOW (ref 22–32)
Calcium: 8 mg/dL — ABNORMAL LOW (ref 8.9–10.3)
Chloride: 112 mmol/L — ABNORMAL HIGH (ref 98–111)
Creatinine, Ser: 2.79 mg/dL — ABNORMAL HIGH (ref 0.44–1.00)
GFR, Estimated: 16 mL/min — ABNORMAL LOW
Glucose, Bld: 104 mg/dL — ABNORMAL HIGH (ref 70–99)
Potassium: 4.8 mmol/L (ref 3.5–5.1)
Sodium: 141 mmol/L (ref 135–145)

## 2024-11-04 LAB — CBC WITH DIFFERENTIAL/PLATELET
Abs Immature Granulocytes: 0.9 K/uL — ABNORMAL HIGH (ref 0.00–0.07)
Basophils Absolute: 0 K/uL (ref 0.0–0.1)
Basophils Relative: 0 %
Eosinophils Absolute: 0 K/uL (ref 0.0–0.5)
Eosinophils Relative: 0 %
HCT: 25.3 % — ABNORMAL LOW (ref 36.0–46.0)
Hemoglobin: 7.9 g/dL — ABNORMAL LOW (ref 12.0–15.0)
Immature Granulocytes: 6 %
Lymphocytes Relative: 2 %
Lymphs Abs: 0.3 K/uL — ABNORMAL LOW (ref 0.7–4.0)
MCH: 27.2 pg (ref 26.0–34.0)
MCHC: 31.2 g/dL (ref 30.0–36.0)
MCV: 87.2 fL (ref 80.0–100.0)
Monocytes Absolute: 0.6 K/uL (ref 0.1–1.0)
Monocytes Relative: 4 %
Neutro Abs: 13.6 K/uL — ABNORMAL HIGH (ref 1.7–7.7)
Neutrophils Relative %: 88 %
Platelets: 168 K/uL (ref 150–400)
RBC: 2.9 MIL/uL — ABNORMAL LOW (ref 3.87–5.11)
RDW: 20.6 % — ABNORMAL HIGH (ref 11.5–15.5)
Smear Review: NORMAL
WBC: 15.4 K/uL — ABNORMAL HIGH (ref 4.0–10.5)
nRBC: 0.3 % — ABNORMAL HIGH (ref 0.0–0.2)

## 2024-11-04 LAB — GLUCOSE, CAPILLARY
Glucose-Capillary: 106 mg/dL — ABNORMAL HIGH (ref 70–99)
Glucose-Capillary: 100 mg/dL — ABNORMAL HIGH (ref 70–99)
Glucose-Capillary: 124 mg/dL — ABNORMAL HIGH (ref 70–99)
Glucose-Capillary: 123 mg/dL — ABNORMAL HIGH (ref 70–99)
Glucose-Capillary: 122 mg/dL — ABNORMAL HIGH (ref 70–99)

## 2024-11-04 LAB — PHOSPHORUS: Phosphorus: 5 mg/dL — ABNORMAL HIGH (ref 2.5–4.6)

## 2024-11-04 LAB — MAGNESIUM: Magnesium: 2.2 mg/dL (ref 1.7–2.4)

## 2024-11-04 NOTE — Progress Notes (Signed)
 " [facility] Progress Note  Patient: Jody Shaw     FMW:[id number]  DOA: 10/29/2024   PCP: Nichole Senior, MD       Brief hospital course: 83 y/o F with giant cell arteritis, lymphedema, CAD presenting for weakness and evaluation of possible PNA.  Noted to have Cr 2.76, WBC 13.3 and hypothermia. Rectal temps 91 and 93 degrees. Also noted to have hypotension with MAPs < 60 and requited pressors. Hospital course complicated by confusion. Eventually found to have MSSA bacteremia. Declined TEE.   Subjective:  She has not complaints. She is quite weak but refuses to go to SNF and insists on going home. She lives at home alone.   Assessment and Plan: Principal Problem:   Septic shock, MSSA bacteremia - hypothermia, hypotension, leukocytosis and lactic acidosis resolved - currently receiving Cefazolin  - she required stress dose steroids during hospital course  - TEE declined - ID recommends 6 wks of antibiotics (from 12/19)- awaiting PICC  Active Problems: Delirium - due to above- appears to be improving     Acute renal failure superimposed on stage 3b chronic kidney disease (HCC)   - Cr rose to 3.05 and is not 2.79   - baseline is about 1.6 - have discussed with the patient's son that she would not be an ideal candidate for dialysis if her renal function was to decline- he agrees that he would not want her to have dialysis     Chronic acquired lymphedema - cont ACE wraps of legs    Anxiety and depression  - Zoloft     Generalized weakness  - needing a Hoyer lift- will be going to SNF as family is not able to care for her in this situations       Code Status: Limited: Do not attempt resuscitation (DNR) -DNR-LIMITED -Do Not Intubate/DNI  Total time on patient care: DVT prophylaxis:  heparin  injection 5,000 Units Start: 10/31/24 2200     Objective:   Vitals:   11/04/24 0300 11/04/24 0400 11/04/24 0500 11/04/24 0700  BP: (!) 113/50 (!) 116/54 (!) 119/43  (!) 128/51  Pulse: (!) 58 64 67 70  Resp: 14 14 12 19   Temp:  (!) 96 F (35.6 C)    TempSrc:  Axillary    SpO2: 98% 97% 99% 98%  Weight:   96.8 kg   Height:       Filed Weights   11/02/24 0500 11/03/24 0500 11/04/24 0500  Weight: 96 kg 98 kg 96.8 kg   Exam: General exam: Appears comfortable  HEENT: oral mucosa moist Respiratory system: Clear to auscultation.  Cardiovascular system: S1 & S2 heard  Gastrointestinal system: Abdomen soft, non-tender, nondistended. Normal bowel sounds   Extremities: No cyanosis, clubbing or edema Psychiatry:  Mood & affect appropriate.   CBC: Recent Labs  Lab 10/31/24 0311 11/01/24 0250 11/02/24 0310 11/03/24 0242 11/04/24 0300  WBC 10.8* 21.3* 17.4* 16.2* 15.4*  NEUTROABS 9.8* 19.8* 16.0* 14.5* 13.6*  HGB 9.4* 9.0* 8.7* 7.4* 7.9*  HCT 30.3* 28.3* 27.9* 23.5* 25.3*  MCV 86.3 85.2 85.6 84.8 87.2  PLT 201 249 206 182 168   Basic Metabolic Panel: Recent Labs  Lab 10/31/24 0311 11/01/24 0250 11/02/24 0310 11/03/24 0242 11/04/24 0300  NA 142 142 144 143 141  K 4.9 4.6 4.1 4.0 4.8  CL 113* 111 114* 112* 112*  CO2 14* 15* 15* 17* 16*  GLUCOSE 77 138* 125* 100* 104*  BUN 48* 46* 49* 51* 55*  CREATININE  2.43* 2.51* 2.78* 3.05* 2.79*  CALCIUM  7.5* 7.6* 7.9* 8.0* 8.0*  MG 1.8 2.1 2.1 2.3 2.2  PHOS  --  5.1* 4.6 4.7* 5.0*     Scheduled Meds:  aspirin  EC  81 mg Oral Daily   Chlorhexidine  Gluconate Cloth  6 each Topical Daily   clotrimazole    Topical BID   famotidine   20 mg Oral Daily   fiber  1 packet Oral BID   heparin  injection (subcutaneous)  5,000 Units Subcutaneous Q8H   hydrocortisone  sod succinate (SOLU-CORTEF ) inj  50 mg Intravenous Daily   levothyroxine   88 mcg Oral Q0600   melatonin  3 mg Oral QHS   nystatin   5 mL Oral QID   [START ON 11/05/2024] predniSONE   5 mg Oral Q breakfast   rosuvastatin   10 mg Oral Once per day on Sunday Wednesday   sertraline   25 mg Oral Daily   umeclidinium-vilanterol  1 puff Inhalation Daily     Imaging and lab data personally reviewed   Author: Kaye Luoma  11/04/2024 8:07 AM  To contact [facility]>   Check the care team in [facility] and look for the attending/consulting [facility] provider listed  Log into [email or web address] and use [place]'s universal password   Go to> [facility]  and find provider  If you still have difficulty reaching the provider, please page the [facility] (Director on Call) for the Hospitalists listed on amion     "

## 2024-11-04 NOTE — Progress Notes (Signed)
 This nurse discussed at length with son at bedside the plan of care and options at this time. Patient's son was hoping to speak to CM to discuss placement options and whether hospice was an option for placement as well. Patient continues to state she wants to go home.

## 2024-11-04 NOTE — Progress Notes (Signed)
 Physical Therapy Treatment Patient Details Name: Jody Shaw MRN: [id number] DOB: 04/23/1941 Today's Date: 11/04/2024   History of Present Illness Pt is an 83 year old woman admitted on 10/29/24 with AMS and MSSA bacteremia, + sepsis. PMH: giant cell arteritis, hypothyroidism, CAD, GERD, lymphedema, HTN, HLD and anxiety.    PT Comments  The patient is alert and ready to mobilize. Patient  self assisted legs to bed edge with mod assistance, able to move to sitting. Stedy lift used for standing and transfer to recliner. Patient  does present with leg weakness. Patient tolerated standing  well in STEDY. Patient will benefit from continued inpatient follow up therapy, <3 hours/day. . Noted chest congestion and encouraged coughing. SPo2 on RA > 95%    If plan is discharge home, recommend the following: Two people to help with walking and/or transfers;A lot of help with bathing/dressing/bathroom;Assistance with cooking/housework;Assist for transportation;Help with stairs or ramp for entrance   Can travel by private vehicle     No  Equipment Recommendations    none   Recommendations for Other Services       Precautions / Restrictions Precautions Precautions: Fall Restrictions Weight Bearing Restrictions Per Provider Order: No     Mobility  Bed Mobility Overal bed mobility: Needs Assistance Bed Mobility: Supine to Sit     Supine to sit: Mod assist, Used rails     General bed mobility comments: patient self assisted legs to  bed edge, mod assist for trunk to sitting upright.    Transfers Overall transfer level: Needs assistance   Transfers: Sit to/from Stand, Bed to chair/wheelchair/BSC Sit to Stand: Min assist, Mod assist, +2 safety/equipment, +2 physical assistance, From elevated surface           General transfer comment: assist to rise and steady, increased time, multimodal cues for hand placement, pt able to pull to stand at STEDY x 2 with min assist. Transfer  via Lift Equipment: Stedy  Ambulation/Gait                   Stairs             Wheelchair Mobility     Tilt Bed    Modified Rankin (Stroke Patients Only)       Balance   Sitting-balance support: No upper extremity supported, Feet supported Sitting balance-Leahy Scale: Fair     Standing balance support: Bilateral upper extremity supported, Reliant on assistive device for balance Standing balance-Leahy Scale: Poor                              Communication Communication Communication: Impaired Factors Affecting Communication: Hearing impaired  Cognition Arousal: Alert Behavior During Therapy: WFL for tasks assessed/performed   PT - Cognitive impairments: No apparent impairments                           Following commands impaired: Follows one step commands with increased time    Cueing Cueing Techniques: Verbal cues, Tactile cues  Exercises      General Comments        Pertinent Vitals/Pain Pain Assessment Faces Pain Scale: Hurts even more Pain Location: right lower leg Pain Descriptors / Indicators: Grimacing, Guarding, Moaning Pain Intervention(s): Monitored during session    Home Living Family/patient expects to be discharged to:: Skilled nursing facility  Prior Function            PT Goals (current goals can now be found in the care plan section) Progress towards PT goals: Progressing toward goals    Frequency    Min 2X/week      PT Plan      Co-evaluation              AM-PAC PT 6 Clicks Mobility   Outcome Measure  Help needed turning from your back to your side while in a flat bed without using bedrails?: A Lot Help needed moving from lying on your back to sitting on the side of a flat bed without using bedrails?: A Lot Help needed moving to and from a bed to a chair (including a wheelchair)?: A Lot Help needed standing up from a chair using your arms  (e.g., wheelchair or bedside chair)?: A Lot Help needed to walk in hospital room?: Total Help needed climbing 3-5 steps with a railing? : Total 6 Click Score: 10    End of Session   Activity Tolerance: Patient tolerated treatment well;Patient limited by fatigue Patient left: in chair;with call bell/phone within reach;with chair alarm set;with nursing/sitter in room Nurse Communication: Mobility status;Need for lift equipment PT Visit Diagnosis: Difficulty in walking, not elsewhere classified (R26.2);Muscle weakness (generalized) (M62.81)     Time: 8955-8879 PT Time Calculation (min) (ACUTE ONLY): 36 min  Charges:    $Therapeutic Activity: 23-37 mins PT General Charges $$ ACUTE PT VISIT: 1 Visit                     [name] PT Acute Rehabilitation Services Office [phone]    Potters Darice Norris 11/04/2024, 1:07 PM

## 2024-11-04 NOTE — TOC CM/SW Note (Signed)
 30 Day PASRR Note   Patient Details  Name: Jody Shaw Date of Birth: 25-Feb-1941   Transition of Care [facility]) CM/SW Contact:    Jon ONEIDA Anon, RN Phone Number: 11/04/2024, 12:03 PM  To Whom It May Concern:  Please be advised that this patient will require a short-term nursing home stay - anticipated 30 days or less for rehabilitation and strengthening.   The plan is for return home.

## 2024-11-04 NOTE — TOC Progression Note (Signed)
 Transition of Care [facility]) - Progression Note    Patient Details  Name: Jody Shaw MRN: [id number] Date of Birth: 1941-08-27  [facility]) CM/SW Contact  Jon ONEIDA Anon, RN Phone Number: 11/04/2024, 12:56 PM  Clinical Narrative:    Received consult for SNF placement. RNCM spoke with pt daughter Elijah via telephone at [phone] to discuss PT/OT recommendation for SNF placement. Pt daughter is in agreement for RNCM to fax out referrals for SNF placement. Explained process and let pt daughter know a list of bed offers will be presented once they become available. SNF referrals faxed out, awaiting any bed offers. Pt will need Level II PASRR, pending. ICM will continue to follow for DC planning needs.      Expected Discharge Plan: Skilled Nursing Facility Barriers to Discharge: Continued Medical Work up               Expected Discharge Plan and Services In-house Referral: Hospice / Palliative Care Discharge Planning Services: CM Consult Post Acute Care Choice: Durable Medical Equipment Living arrangements for the past 2 months: Single Family Home                 DME Arranged: N/A DME Agency: NA       HH Arranged: NA HH Agency: NA         Social Drivers of Health (SDOH) Interventions SDOH Screenings   Food Insecurity: No Food Insecurity (11/01/2024)  Housing: Low Risk (11/01/2024)  Transportation Needs: No Transportation Needs (11/01/2024)  Utilities: Not At Risk (11/01/2024)  Financial Resource Strain: Low Risk (10/16/2023)   Received from [facility]  Social Connections: Socially Isolated (11/02/2024)  Tobacco Use: Medium Risk (11/04/2024)    Readmission Risk Interventions    11/03/2024   12:03 PM 10/31/2024    2:41 PM 03/25/2024    2:11 PM  Readmission Risk Prevention Plan  Transportation Screening Complete Complete Complete  PCP or Specialist Appt within 5-7 Days Complete Complete Complete  Home Care Screening Complete Complete  Complete  Medication Review (RN CM) Complete Complete Complete

## 2024-11-04 NOTE — NC FL2 (Addendum)
 " [place]  MEDICAID FL2 LEVEL OF CARE FORM     IDENTIFICATION  Patient Name: Jody Shaw Birthdate: June 29, 1941 Sex: female Admission Date (Current Location): 10/29/2024  [facility] and [place] Number:  Producer, Television/film/video and Address:  [facility],  [address], [place] [address]      Provider Number: [id number]  Attending Physician Name and Address:  Earley Saucer, MD  Relative Name and Phone Number:  Cassondra, Stachowski  Daughter, Emergency Contact  [phone]    Current Level of Care: Hospital Recommended Level of Care: Skilled Nursing Facility Prior Approval Number:    Date Approved/Denied:   PASRR Number: [id number]  Discharge Plan: SNF    Current Diagnoses: Patient Active Problem List   Diagnosis Date Noted   Goals of care, counseling/discussion 11/02/2024   Septic shock (HCC) 10/31/2024   Pressure injury of skin 10/31/2024   Palliative care encounter 10/31/2024   Counseling and coordination of care 10/31/2024   Hypotension 10/30/2024   MSSA bacteremia 10/30/2024   Hypothermia 10/29/2024   Generalized weakness 10/29/2024   Acute renal failure superimposed on stage 3b chronic kidney disease (HCC) 10/29/2024   AKI (acute kidney injury) 03/24/2024   Leukocytosis 03/24/2024   Chronic acquired lymphedema 03/24/2024   Anxiety and depression 03/24/2024   Vision loss of left eye 07/31/2017   Difficulty swallowing 07/31/2017   Hypokalemia 07/31/2017   Giant cell arteritis (HCC) 07/31/2017   GERD (gastroesophageal reflux disease)    Hypothyroidism    Essential hypertension 11/24/2014   CAD (coronary artery disease) 11/24/2014   Hyperlipidemia 11/24/2014    Orientation RESPIRATION BLADDER Height & Weight     Self, Situation, Place, Time  Normal Incontinent Weight: 96.8 kg Height:  5' 2 (157.5 cm)  BEHAVIORAL SYMPTOMS/MOOD NEUROLOGICAL BOWEL NUTRITION STATUS      Incontinent Diet (Regular, Thin Liquids)  AMBULATORY STATUS COMMUNICATION OF  NEEDS Skin   Extensive Assist Verbally Other (Comment), PU Stage and Appropriate Care (Pressure Injury Buttocks Left Stage 3 Change Q 3 days or PRN soiling  , Leg Right;Left;Posterior Change Q Thurs/Sat/Mon , Pressure Injury Foot Left;Posterior)     PU Stage 3 Dressing:  (Q 3 days or PRN soiling)                 Personal Care Assistance Level of Assistance  Dressing Bathing Assistance: Limited assistance Feeding assistance: Limited assistance Dressing Assistance: Limited assistance     Functional Limitations Info  Sight, Speech, Hearing Sight Info: Impaired (Wears eyeglasses) Hearing Info: Impaired Speech Info: Adequate    SPECIAL CARE FACTORS FREQUENCY  PT (By licensed PT), OT (By licensed OT) (Pt has PICC, will need IV ABT)     PT Frequency: 5x/wk OT Frequency: 5x/wk            Contractures Contractures Info: Not present    Additional Factors Info  Code Status, Allergies Code Status Info: DNR Allergies Info: Ace Inhibitors, Angiotensin Receptor Blockers, Tetanus Toxoid, Adsorbed, Watermelon (Citrullus Vulgaris), Iodine, Sulfa Antibiotics           Current Medications (11/04/2024):  This is the current hospital active medication list Current Facility-Administered Medications  Medication Dose Route Frequency Provider Last Rate Last Admin   acetaminophen  (TYLENOL ) tablet 650 mg  650 mg Oral Q6H PRN Amponsah, Prosper M, MD   650 mg at 10/31/24 0002   Or   acetaminophen  (TYLENOL ) suppository 650 mg  650 mg Rectal Q6H PRN Lou Claretta HERO, MD       artificial  tears ophthalmic solution 1 drop  1 drop Both Eyes TID PRN Lou Claretta HERO, MD       aspirin  EC tablet 81 mg  81 mg Oral Daily Payne, John D, PA-C   81 mg at 11/04/24 1031   bisacodyl  (DULCOLAX) EC tablet 5 mg  5 mg Oral Daily PRN Amponsah, Prosper M, MD       ceFAZolin  (ANCEF ) IVPB 2g/100 mL premix  2 g Intravenous Q12H Bevely Damien RAMAN, [facility]   Stopped at 11/04/24 1123   Chlorhexidine  Gluconate Cloth 2 %  PADS 6 each  6 each Topical Daily Cheryle Page, MD   6 each at 11/04/24 1558   clotrimazole  (LOTRIMIN ) 1 % cream   Topical BID Payne, John D, PA-C   Given at 11/04/24 1038   famotidine  (PEPCID ) tablet 20 mg  20 mg Oral Daily Adrien Guan, Tamala, MD   20 mg at 11/04/24 1031   fiber (NUTRISOURCE FIBER) 1 packet  1 packet Oral BID Payne, John D, PA-C   1 packet at 11/04/24 1031   guaiFENesin  (ROBITUSSIN) 100 MG/5ML liquid 5 mL  5 mL Oral Q4H PRN Lou Claretta HERO, MD   5 mL at 11/04/24 0624   heparin  injection 5,000 Units  5,000 Units Subcutaneous Q8H Payne, John D, PA-C   5,000 Units at 11/04/24 1447   ipratropium-albuterol  (DUONEB) 0.5-2.5 (3) MG/3ML nebulizer solution 3 mL  3 mL Nebulization Q4H PRN Pawar, Rahul, MD   3 mL at 11/02/24 0008   levothyroxine  (SYNTHROID ) tablet 88 mcg  88 mcg Oral Q0600 Lou Claretta HERO, MD   88 mcg at 11/04/24 9374   loperamide  (IMODIUM ) capsule 2 mg  2 mg Oral Q6H PRN Cheryle Page, MD       melatonin tablet 3 mg  3 mg Oral QHS Adrien Guan, Tamala, MD   3 mg at 11/03/24 2257   nystatin  (MYCOSTATIN ) 100000 UNIT/ML suspension 500,000 Units  5 mL Oral QID Adrien Guan, Tamala, MD   500,000 Units at 11/04/24 1447   ondansetron  (ZOFRAN ) tablet 4 mg  4 mg Oral Q6H PRN Amponsah, Prosper M, MD       Or   ondansetron  (ZOFRAN ) injection 4 mg  4 mg Intravenous Q6H PRN Amponsah, Prosper M, MD   4 mg at 10/30/24 9490   Oral care mouth rinse  15 mL Mouth Rinse PRN Cheryle Page, MD       NOREEN ON 11/05/2024] predniSONE  (DELTASONE ) tablet 5 mg  5 mg Oral Q breakfast Adrien Guan, Tamala, MD       rosuvastatin  (CRESTOR ) tablet 10 mg  10 mg Oral Once per day on Sunday Wednesday Lou Claretta HERO, MD   10 mg at 11/02/24 2115   senna-docusate (Senokot-S) tablet 1 tablet  1 tablet Oral QHS PRN Amponsah, Prosper M, MD   1 tablet at 10/31/24 0002   sertraline  (ZOLOFT ) tablet 25 mg  25 mg Oral Daily Amponsah, Prosper M, MD   25 mg at 11/04/24 1037    umeclidinium-vilanterol (ANORO ELLIPTA ) 62.5-25 MCG/ACT 1 puff  1 puff Inhalation Daily Pawar, Rahul, MD   1 puff at 11/04/24 0730     Discharge Medications: Please see discharge summary for a list of discharge medications.  Relevant Imaging Results:  Relevant Lab Results:   Additional Information SSN: [id number]  Jon ONEIDA Anon, RN     "

## 2024-11-04 NOTE — Plan of Care (Signed)
  Problem: Education: Goal: Knowledge of General Education information will improve Description: Including pain rating scale, medication(s)/side effects and non-pharmacologic comfort measures Outcome: Progressing   Problem: Clinical Measurements: Goal: Respiratory complications will improve Outcome: Progressing Goal: Cardiovascular complication will be avoided Outcome: Progressing   Problem: Nutrition: Goal: Adequate nutrition will be maintained Outcome: Progressing   

## 2024-11-04 NOTE — Plan of Care (Signed)
 Discussed with nephrology yesterday; benefit outweighs risk and will place picc line for abx  Opat plan and id f/u as per 11/03/24 id note  Discharge when cleared by primary team  Id will sign off

## 2024-11-05 DIAGNOSIS — I890 Lymphedema, not elsewhere classified: Secondary | ICD-10-CM | POA: Diagnosis not present

## 2024-11-05 DIAGNOSIS — R7881 Bacteremia: Secondary | ICD-10-CM | POA: Diagnosis not present

## 2024-11-05 DIAGNOSIS — T68XXXD Hypothermia, subsequent encounter: Secondary | ICD-10-CM

## 2024-11-05 DIAGNOSIS — B9561 Methicillin susceptible Staphylococcus aureus infection as the cause of diseases classified elsewhere: Secondary | ICD-10-CM | POA: Diagnosis not present

## 2024-11-05 DIAGNOSIS — I959 Hypotension, unspecified: Secondary | ICD-10-CM

## 2024-11-05 DIAGNOSIS — F32A Depression, unspecified: Secondary | ICD-10-CM | POA: Diagnosis not present

## 2024-11-05 DIAGNOSIS — Z7189 Other specified counseling: Secondary | ICD-10-CM | POA: Diagnosis not present

## 2024-11-05 DIAGNOSIS — R6521 Severe sepsis with septic shock: Secondary | ICD-10-CM | POA: Diagnosis not present

## 2024-11-05 DIAGNOSIS — N179 Acute kidney failure, unspecified: Secondary | ICD-10-CM | POA: Diagnosis not present

## 2024-11-05 DIAGNOSIS — N1832 Chronic kidney disease, stage 3b: Secondary | ICD-10-CM | POA: Diagnosis not present

## 2024-11-05 DIAGNOSIS — Z515 Encounter for palliative care: Secondary | ICD-10-CM | POA: Diagnosis not present

## 2024-11-05 DIAGNOSIS — F419 Anxiety disorder, unspecified: Secondary | ICD-10-CM | POA: Diagnosis not present

## 2024-11-05 LAB — BASIC METABOLIC PANEL WITH GFR
Anion gap: 16 — ABNORMAL HIGH (ref 5–15)
BUN: 55 mg/dL — ABNORMAL HIGH (ref 8–23)
CO2: 16 mmol/L — ABNORMAL LOW (ref 22–32)
Calcium: 8.2 mg/dL — ABNORMAL LOW (ref 8.9–10.3)
Chloride: 110 mmol/L (ref 98–111)
Creatinine, Ser: 2.74 mg/dL — ABNORMAL HIGH (ref 0.44–1.00)
GFR, Estimated: 16 mL/min — ABNORMAL LOW
Glucose, Bld: 98 mg/dL (ref 70–99)
Potassium: 4.2 mmol/L (ref 3.5–5.1)
Sodium: 142 mmol/L (ref 135–145)

## 2024-11-05 LAB — CULTURE, BLOOD (ROUTINE X 2)
Culture: NO GROWTH
Culture: NO GROWTH

## 2024-11-05 LAB — GLUCOSE, CAPILLARY
Glucose-Capillary: 116 mg/dL — ABNORMAL HIGH (ref 70–99)
Glucose-Capillary: 131 mg/dL — ABNORMAL HIGH (ref 70–99)
Glucose-Capillary: 79 mg/dL (ref 70–99)
Glucose-Capillary: 85 mg/dL (ref 70–99)
Glucose-Capillary: 97 mg/dL (ref 70–99)
Glucose-Capillary: 98 mg/dL (ref 70–99)

## 2024-11-05 LAB — CBC WITH DIFFERENTIAL/PLATELET
Abs Immature Granulocytes: 1.38 K/uL — ABNORMAL HIGH (ref 0.00–0.07)
Basophils Absolute: 0 K/uL (ref 0.0–0.1)
Basophils Relative: 0 %
Eosinophils Absolute: 0 K/uL (ref 0.0–0.5)
Eosinophils Relative: 0 %
HCT: 25.5 % — ABNORMAL LOW (ref 36.0–46.0)
Hemoglobin: 8 g/dL — ABNORMAL LOW (ref 12.0–15.0)
Immature Granulocytes: 6 %
Lymphocytes Relative: 2 %
Lymphs Abs: 0.5 K/uL — ABNORMAL LOW (ref 0.7–4.0)
MCH: 27 pg (ref 26.0–34.0)
MCHC: 31.4 g/dL (ref 30.0–36.0)
MCV: 86.1 fL (ref 80.0–100.0)
Monocytes Absolute: 1.5 K/uL — ABNORMAL HIGH (ref 0.1–1.0)
Monocytes Relative: 7 %
Neutro Abs: 19.9 K/uL — ABNORMAL HIGH (ref 1.7–7.7)
Neutrophils Relative %: 85 %
Platelets: 162 K/uL (ref 150–400)
RBC: 2.96 MIL/uL — ABNORMAL LOW (ref 3.87–5.11)
RDW: 20.3 % — ABNORMAL HIGH (ref 11.5–15.5)
Smear Review: NORMAL
WBC: 23.4 K/uL — ABNORMAL HIGH (ref 4.0–10.5)
nRBC: 0.2 % (ref 0.0–0.2)

## 2024-11-05 LAB — PHOSPHORUS: Phosphorus: 4.8 mg/dL — ABNORMAL HIGH (ref 2.5–4.6)

## 2024-11-05 LAB — MAGNESIUM: Magnesium: 2 mg/dL (ref 1.7–2.4)

## 2024-11-05 MED ORDER — PREDNISONE 20 MG PO TABS
20.0000 mg | ORAL_TABLET | Freq: Every day | ORAL | Status: DC
  Administered 2024-11-05 – 2024-11-07 (×3): 20 mg via ORAL
  Filled 2024-11-05 (×3): qty 1

## 2024-11-05 MED ORDER — MIDODRINE HCL 5 MG PO TABS
5.0000 mg | ORAL_TABLET | Freq: Three times a day (TID) | ORAL | Status: DC
  Administered 2024-11-05 – 2024-11-08 (×9): 5 mg via ORAL
  Filled 2024-11-05 (×9): qty 1

## 2024-11-05 NOTE — Plan of Care (Signed)
" °  Problem: Education: Goal: Knowledge of General Education information will improve Description: Including pain rating scale, medication(s)/side effects and non-pharmacologic comfort measures Outcome: Progressing   Problem: Health Behavior/Discharge Planning: Goal: Ability to manage health-related needs will improve Outcome: Progressing   Problem: Clinical Measurements: Goal: Ability to maintain clinical measurements within normal limits will improve Outcome: Progressing Goal: Will remain free from infection Outcome: Progressing Goal: Diagnostic test results will improve Outcome: Progressing Goal: Respiratory complications will improve Outcome: Progressing Goal: Cardiovascular complication will be avoided Outcome: Progressing   Problem: Activity: Goal: Risk for activity intolerance will decrease Outcome: Progressing   Problem: Nutrition: Goal: Adequate nutrition will be maintained Outcome: Progressing   Problem: Coping: Goal: Level of anxiety will decrease Outcome: Progressing   Problem: Elimination: Goal: Will not experience complications related to bowel motility Outcome: Progressing Goal: Will not experience complications related to urinary retention Outcome: Progressing   Problem: Pain Managment: Goal: General experience of comfort will improve and/or be controlled Outcome: Progressing   Problem: Safety: Goal: Ability to remain free from injury will improve Outcome: Progressing   Problem: Skin Integrity: Goal: Risk for impaired skin integrity will decrease Outcome: Progressing   Problem: Activity: Goal: Ability to tolerate increased activity will improve Outcome: Progressing   Problem: Clinical Measurements: Goal: Ability to maintain a body temperature in the normal range will improve Outcome: Progressing   Problem: Respiratory: Goal: Ability to maintain adequate ventilation will improve Outcome: Progressing Goal: Ability to maintain a clear airway  will improve Outcome: Progressing   Cindy S. Loreli BSN, RN, CCRP, CCRN 11/05/2024 3:10 AM "

## 2024-11-05 NOTE — Progress Notes (Signed)
 " PROGRESS NOTE  Jody Shaw FMW:[id number] DOB: 1941/10/26   PCP: Nichole Senior, MD  Patient is from: Home.  DOA: 10/29/2024 LOS: 7  Chief complaints Chief Complaint  Patient presents with   Weakness    Pt dx with PNA today @ PCP,  pt coming from PCP with c/o weakness- generalized, malaise, hallucination, abnormal lab with creatinine of 3. Pt reports illness x 1 week, a/o x4.       Brief Narrative / Interim history: [name] with giant cell arteritis, lymphedema, chronic leukocytosis, CAD presenting for weakness and evaluation of possible PNA, and found to have septic shock in the setting of MSSA bacteremia.  Noted to have Cr 2.76 (was 1.6 in 03/2024), WBC 13.3 and hypothermia. Rectal temps 91 and 93 degrees. Also noted to have hypotension with MAPs < 60 and requited pressors. Hospital course complicated by confusion. Eventually found to have MSSA bacteremia.  Repeat blood cultures NGTD.  TTE without vegetation.  Declined TEE.  ID recommended IV Ancef  for 6 weeks starting 10/31/2024.  On 12/24, hypotensive, lethargic with low temperatures.     Subjective: Seen and examined earlier this morning.  Patient is hypotensive and lethargic this morning.  Also low temperature to 96.7.  Started on [employer]  No complaints but difficult to understand with her very weak voice and lethargy.  Patient son at bedside.  He says she has sleep last night and very tired this morning.   Assessment and plan: Septic shock, MSSA bacteremia: Present on admission.  Hypothermia, hypotension, leukocytosis, lactic acidosis and AKI.  Repeat blood cultures NGTD.  TTE without vegetation.  Reportedly declined TEE.  Remains hypotensive with leukocytosis and lethargy this morning. -Continue IV Ancef  for 6 weeks starting from 12/19 per recommendation by ID. -Continue Bair hugger as needed for low temperature -Add p.o. midodrine  for low blood pressure -Her leukocytosis is acute on chronic, could be leukemoid  reaction. -PICC line   Delirium: In the setting of the above.  Oriented to self and his son.  Full assessment difficult due to lethargy and weak voice. -Reorientation and delirium precaution -Minimize sedating medications   AKI on CKD-3B: Cr was 1.6 in 03/2024.  Might have progressed to CKD-4.  Stable. Recent Labs    03/27/24 0332 03/28/24 0329 10/29/24 1441 10/30/24 0546 10/31/24 0311 11/01/24 0250 11/02/24 0310 11/03/24 0242 11/04/24 0300 11/05/24 0304  BUN 100* 83* 61* 49* 48* 46* 49* 51* 55* 55*  CREATININE 1.66* 1.67* 2.76* 2.36* 2.43* 2.51* 2.78* 3.05* 2.79* 2.74*  - Continue monitoring - Manage hypertension as above - Previous attending discussed with patient's son who does not want her to have dialysis if renal function continues to decline.  Hypotension/lethargy: She is on chronic prednisone  which could increase her risk of around sufficiency. -Started p.o. midodrine  5 mg 3 times daily today -Increase steroid if no improvement with above.    Chronic acquired lymphedema - cont ACE wraps of legs  History of giant cell arteritis -Continue home prednisone .   Anxiety and depression: Difficult to assess given lethargy.  - Continue Zoloft    Generalized weakness  - needing a Hoyer lift- will be going to SNF as family is not able to care for her in this situations   Class II obesity Body mass index is 37.78 kg/m.         Pressure skin injury: Present on admission. Wound 10/30/24 Pressure Injury Buttocks Left Stage 3 -  Full thickness tissue loss. Subcutaneous fat may be visible  but bone, tendon or muscle are NOT exposed. (Active)     Wound 10/30/24 Pressure Injury Foot Left;Posterior Deep Tissue Pressure Injury - Purple or maroon localized area of discolored intact skin or blood-filled blister due to damage of underlying soft tissue from pressure and/or shear. (Active)   DVT prophylaxis:  heparin  injection 5,000 Units Start: 10/31/24 2200  Code Status:  DNR Family Communication: Updated patient's son at bedside Level of care: Progressive Status is: Inpatient Remains inpatient appropriate because: Hypotension, MSSA bacteremia and lethargy   Final disposition: SNF   55 minutes with more than 50% spent in reviewing records, counseling patient/family and coordinating care.  Consultants:  Infectious disease Critical care Palliative care  Procedures: None  Microbiology summarized: 12/17-COVID-19, influenza and RSV PCR nonreactive 12/17-20 pathogen RVP nonreactive 12/17-MRSA PCR screen nonreactive 12/17-blood culture with MSSA 12/18-urine culture with insignificant growth 12/18-C. difficile negative 12/19-blood cultures NGTD  Objective: Vitals:   11/05/24 0700 11/05/24 0751 11/05/24 1045 11/05/24 1212  BP: (!) 112/45     Pulse: 86     Resp: 18     Temp:  (!) 96.7 F (35.9 C) (!) 97.5 F (36.4 C) (!) 97.5 F (36.4 C)  TempSrc:  Axillary Axillary Oral  SpO2: 98%     Weight:      Height:        Examination:  GENERAL: Sleepy.  Lethargic.  Wearing [employer]. HEENT: MMM.  Vision and hearing grossly intact.  NECK: Supple.  No apparent JVD.  RESP:  No IWOB.  Fair aeration bilaterally. CVS:  RRR. Heart sounds normal.  ABD/GI/GU: BS+. Abd soft, NTND.  MSK/EXT: Ace wrap over BLE. NEURO: Sleepy and lethargic but wakes to voice.  Oriented to self and place. PSYCH: Calm. Normal affect.   Sch Meds:  Scheduled Meds:  aspirin  EC  81 mg Oral Daily   Chlorhexidine  Gluconate Cloth  6 each Topical Daily   clotrimazole    Topical BID   famotidine   20 mg Oral Daily   fiber  1 packet Oral BID   heparin  injection (subcutaneous)  5,000 Units Subcutaneous Q8H   levothyroxine   88 mcg Oral Q0600   melatonin  3 mg Oral QHS   midodrine   5 mg Oral TID WC   nystatin   5 mL Oral QID   predniSONE   5 mg Oral Q breakfast   rosuvastatin   10 mg Oral Once per day on Sunday Wednesday   sertraline  25 mg Oral Daily   umeclidinium-vilanterol   1 puff Inhalation Daily   Continuous Infusions:   ceFAZolin (ANCEF) IV Stopped (11/05/24 1112)   PRN Meds:.acetaminophen **OR** acetaminophen, artificial tears, bisacodyl, guaiFENesin, ipratropium-albuterol, loperamide, ondansetron **OR** ondansetron (ZOFRAN) IV, mouth rinse, senna-docusate  Antimicrobials: Anti-infectives (From admission, onward)    Start     Dose/Rate Route Frequency Ordered Stop   11/04/24 1000  ceFAZolin (ANCEF) IVPB 2g/100 mL premix  Status:  Discontinued        2 g 200 mL/hr over 30 Minutes Intravenous Every 24 hours 11/03/24 1033 11/03/24 1323   11/03/24 2200  ceFAZolin (ANCEF) IVPB 2g/100 mL premix        2 g 200 mL/hr over 30 Minutes Intravenous Every 12 hours 11/03/24 1323     10/30/24 1600  azithromycin (ZITHROMAX) 500 mg in sodium chloride 0.9 % 250 mL IVPB  Status:  Discontinued        50 0 mg 250 mL/hr over 60 Minutes Intravenous Every 24 hours 10/30/24 1225 10/30/24 1347   10/30/24 1400  cefTRIAXone  (ROCEPHIN ) 2 g in sodium chloride  0.9 % 100 mL IVPB  Status:  Discontinued        2 g 200 mL/hr over 30 Minutes Intravenous Every 24 hours 10/30/24 1225 10/30/24 1347   10/30/24 1400  ceFAZolin  (ANCEF ) IVPB 2g/100 mL premix  Status:  Discontinued        2 g 200 mL/hr over 30 Minutes Intravenous Every 8 hours 10/30/24 1347 10/30/24 1352   10/30/24 1400  ceFAZolin  (ANCEF ) IVPB 2g/100 mL premix  Status:  Discontinued        2 g 200 mL/hr over 30 Minutes Intravenous Every 12 hours 10/30/24 1352 11/03/24 1033   10/29/24 1445  cefTRIAXone  (ROCEPHIN ) 2 g in sodium chloride  0.9 % 100 mL IVPB        2 g 200 mL/hr over 30 Minutes Intravenous Once 10/29/24 1430 10/29/24 1525   10/29/24 1445  azithromycin  (ZITHROMAX ) 500 mg in sodium chloride  0.9 % 250 mL IVPB        500 mg 250 mL/hr over 60 Minutes Intravenous  Once 10/29/24 1430 10/29/24 1629        I have personally reviewed the following labs and images: CBC: Recent Labs  Lab 11/01/24 0250 11/02/24 0310  11/03/24 0242 11/04/24 0300 11/05/24 0304  WBC 21.3* 17.4* 16.2* 15.4* 23.4*  NEUTROABS 19.8* 16.0* 14.5* 13.6* 19.9*  HGB 9.0* 8.7* 7.4* 7.9* 8.0*  HCT 28.3* 27.9* 23.5* 25.3* 25.5*  MCV 85.2 85.6 84.8 87.2 86.1  PLT 249 206 182 168 162   BMP &GFR Recent Labs  Lab 11/01/24 0250 11/02/24 0310 11/03/24 0242 11/04/24 0300 11/05/24 0304  NA 142 144 143 141 142  K 4.6 4.1 4.0 4.8 4.2  CL 111 114* 112* 112* 110  CO2 15* 15* 17* 16* 16*  GLUCOSE 138* 125* 100* 104* 98  BUN 46* 49* 51* 55* 55*  CREATININE 2.51* 2.78* 3.05* 2.79* 2.74*  CALCIUM  7.6* 7.9* 8.0* 8.0* 8.2*  MG 2.1 2.1 2.3 2.2 2.0  PHOS 5.1* 4.6 4.7* 5.0* 4.8*   Estimated Creatinine Clearance: 16 mL/min (A) (by C-G formula based on SCr of 2.74 mg/dL (H)). Liver & Pancreas: Recent Labs  Lab 10/29/24 1441 10/31/24 0311  AST 20 23  ALT 24 15  ALKPHOS 124 99  BILITOT 0.2 0.2  PROT 7.0 5.2*  ALBUMIN  3.2* 2.4*   No results for input(s): LIPASE, AMYLASE in the last 168 hours. No results for input(s): AMMONIA in the last 168 hours. Diabetic: No results for input(s): HGBA1C in the last 72 hours. Recent Labs  Lab 11/04/24 2002 11/05/24 0017 11/05/24 0440 11/05/24 0749 11/05/24 1210  GLUCAP 122* 131* 85 79 98   Cardiac Enzymes: No results for input(s): CKTOTAL, CKMB, CKMBINDEX, TROPONINI in the last 168 hours. No results for input(s): PROBNP in the last 8760 hours. Coagulation Profile: Recent Labs  Lab 10/29/24 1441  INR 1.2   Thyroid Function Tests: No results for input(s): TSH, T4TOTAL, FREET4, T3FREE, THYROIDAB in the last 72 hours. Lipid Profile: No results for input(s): CHOL, HDL, LDLCALC, TRIG, CHOLHDL, LDLDIRECT in the last 72 hours. Anemia Panel: No results for input(s): VITAMINB12, FOLATE, FERRITIN, TIBC, IRON, RETICCTPCT in the last 72 hours. Urine analysis:    Component Value Date/Time   COLORURINE YELLOW 10/30/2024 0400   APPEARANCEUR  HAZY (A) 10/30/2024 0400   LABSPEC 1.012 10/30/2024 0400   PHURINE 5.0 10/30/2024 0400   GLUCOSEU NEGATIVE 10/30/2024 0400   HGBUR NEGATIVE 10/30/2024 0400   BILIRUBINUR NEGATIVE 10/30/2024 0400  KETONESUR NEGATIVE 10/30/2024 0400   PROTEINUR NEGATIVE 10/30/2024 0400   NITRITE NEGATIVE 10/30/2024 0400   LEUKOCYTESUR SMALL (A) 10/30/2024 0400   Sepsis Labs: Invalid input(s): PROCALCITONIN, LACTICIDVEN  Microbiology: Recent Results (from the past 240 hours)  Resp panel by RT-PCR (RSV, Flu A&B, Covid)     Status: None   Collection Time: 10/29/24  2:31 PM   Specimen: Nasal Swab  Result Value Ref Range Status   SARS Coronavirus 2 by RT PCR NEGATIVE NEGATIVE Final    Comment: (NOTE) SARS-CoV-2 target nucleic acids are NOT DETECTED.  The SARS-CoV-2 RNA is generally detectable in upper respiratory specimens during the acute phase of infection. The lowest concentration of SARS-CoV-2 viral copies this assay can detect is 138 copies/mL. A negative result does not preclude SARS-Cov-2 infection and should not be used as the sole basis for treatment or other patient management decisions. A negative result may occur with  improper specimen collection/handling, submission of specimen other than nasopharyngeal swab, presence of viral mutation(s) within the areas targeted by this assay, and inadequate number of viral copies(<138 copies/mL). A negative result must be combined with clinical observations, patient history, and epidemiological information. The expected result is Negative.  Fact Sheet for Patients:  bloggercourse.com  Fact Sheet for Healthcare Providers:  seriousbroker.it  This test is no t yet approved or cleared by the [place]  FDA and  has been authorized for detection and/or diagnosis of SARS-CoV-2 by FDA under an Emergency Use Authorization (EUA). This EUA will remain  in effect (meaning this test can be used) for  the duration of the COVID-19 declaration under Section 564(b)(1) of the Act, 21 U.S.C.section 360bbb-3(b)(1), unless the authorization is terminated  or revoked sooner.       Influenza A by PCR NEGATIVE NEGATIVE Final   Influenza B by PCR NEGATIVE NEGATIVE Final    Comment: (NOTE) The Xpert Xpress SARS-CoV-2/FLU/RSV plus assay is intended as an aid in the diagnosis of influenza from Nasopharyngeal swab specimens and should not be used as a sole basis for treatment. Nasal washings and aspirates are unacceptable for Xpert Xpress SARS-CoV-2/FLU/RSV testing.  Fact Sheet for Patients: bloggercourse.com  Fact Sheet for Healthcare Providers: seriousbroker.it  This test is not yet approved or cleared by the [place]  FDA and has been authorized for detection and/or diagnosis of SARS-CoV-2 by FDA under an Emergency Use Authorization (EUA). This EUA will remain in effect (meaning this test can be used) for the duration of the COVID-19 declaration under Section 564(b)(1) of the Act, 21 U.S.C. section 360bbb-3(b)(1), unless the authorization is terminated or revoked.     Resp Syncytial Virus by PCR NEGATIVE NEGATIVE Final    Comment: (NOTE) Fact Sheet for Patients: bloggercourse.com  Fact Sheet for Healthcare Providers: seriousbroker.it  This test is not yet approved or cleared by the [place]  FDA and has been authorized for detection and/or diagnosis of SARS-CoV-2 by FDA under an Emergency Use Authorization (EUA). This EUA will remain in effect (meaning this test can be used) for the duration of the COVID-19 declaration under Section 564(b)(1) of the Act, 21 U.S.C. section 360bbb-3(b)(1), unless the authorization is terminated or revoked.  Performed at [facility], [address], [place], [place] [address]   Respiratory (~20 pathogens) panel by PCR      Status: None   Collection Time: 10/29/24  2:31 PM   Specimen: Nasopharyngeal Swab; Respiratory  Result Value Ref Range Status   Adenovirus NOT DETECTED NOT DETECTED Final  Coronavirus 229E NOT DETECTED NOT DETECTED Final    Comment: (NOTE) The Coronavirus on the Respiratory Panel, DOES NOT test for the novel  Coronavirus (2019 nCoV)    Coronavirus HKU1 NOT DETECTED NOT DETECTED Final   Coronavirus NL63 NOT DETECTED NOT DETECTED Final   Coronavirus OC43 NOT DETECTED NOT DETECTED Final   Metapneumovirus NOT DETECTED NOT DETECTED Final   Rhinovirus / Enterovirus NOT DETECTED NOT DETECTED Final   Influenza A NOT DETECTED NOT DETECTED Final   Influenza B NOT DETECTED NOT DETECTED Final   Parainfluenza Virus 1 NOT DETECTED NOT DETECTED Final   Parainfluenza Virus 2 NOT DETECTED NOT DETECTED Final   Parainfluenza Virus 3 NOT DETECTED NOT DETECTED Final   Parainfluenza Virus 4 NOT DETECTED NOT DETECTED Final   Respiratory Syncytial Virus NOT DETECTED NOT DETECTED Final   Bordetella pertussis NOT DETECTED NOT DETECTED Final   Bordetella Parapertussis NOT DETECTED NOT DETECTED Final   Chlamydophila pneumoniae NOT DETECTED NOT DETECTED Final   Mycoplasma pneumoniae NOT DETECTED NOT DETECTED Final    Comment: Performed at [facility], [address] [place], [place] [address]  Blood Culture (routine x 2)     Status: Abnormal   Collection Time: 10/29/24  3:25 PM   Specimen: BLOOD  Result Value Ref Range Status   Specimen Description   Final    BLOOD SITE NOT SPECIFIED Performed at [facility], [address] [place], [place] [address]    Special Requests   Final    BOTTLES DRAWN AEROBIC AND ANAEROBIC Blood Culture results may not be optimal due to an inadequate volume of blood received in culture bottles Performed at [facility], [address], [place], [place] [address]    Culture  Setup Time   Final    GRAM POSITIVE COCCI IN CLUSTERS IN  BOTH AEROBIC AND ANAEROBIC BOTTLES CRITICAL VALUE NOTED.  VALUE IS CONSISTENT WITH PREVIOUSLY REPORTED AND CALLED VALUE.    Culture (A)  Final    STAPHYLOCOCCUS AUREUS SUSCEPTIBILITIES PERFORMED ON PREVIOUS CULTURE WITHIN THE LAST 5 DAYS. Performed at [facility], [address], [place], [place] [address]    Report Status 11/01/2024 FINAL  Final  Blood Culture (routine x 2)     Status: Abnormal   Collection Time: 10/29/24  3:40 PM   Specimen: BLOOD  Result Value Ref Range Status   Specimen Description   Final    BLOOD SITE NOT SPECIFIED Performed at [facility], [address], [place], [place] [address]    Special Requests   Final    BOTTLES DRAWN AEROBIC AND ANAEROBIC Blood Culture results may not be optimal due to an inadequate volume of blood received in culture bottles Performed at [facility], [address], [place], [place] [address]    Culture  Setup Time   Final    GRAM POSITIVE COCCI IN CLUSTERS IN BOTH AEROBIC AND ANAEROBIC BOTTLES CRITICAL RESULT CALLED TO, READ BACK BY AND VERIFIED WITH: MAYA LOUANN LABOR 1327 [id number] FCP Performed at [facility], [address] [place], [place] [address]    Culture STAPHYLOCOCCUS AUREUS (A)  Final   Report Status 11/01/2024 FINAL  Final   Organism ID, Bacteria STAPHYLOCOCCUS AUREUS  Final      Susceptibility   Staphylococcus aureus - MIC*    CIPROFLOXACIN <=0.5 SENSITIVE Sensitive     ERYTHROMYCIN >=8 RESISTANT Resistant     GENTAMICIN >=16 RESISTANT Resistant  OXACILLIN 0.5 SENSITIVE Sensitive     TETRACYCLINE <=1 SENSITIVE Sensitive     VANCOMYCIN  1 SENSITIVE Sensitive     TRIMETH/SULFA <=10 SENSITIVE Sensitive     CLINDAMYCIN <=0.25 SENSITIVE Sensitive     RIFAMPIN <=0.5 SENSITIVE Sensitive     Inducible Clindamycin NEGATIVE Sensitive     LINEZOLID 2 SENSITIVE Sensitive     * STAPHYLOCOCCUS AUREUS  Blood Culture ID Panel (Reflexed)     Status: Abnormal    Collection Time: 10/29/24  3:40 PM  Result Value Ref Range Status   Enterococcus faecalis NOT DETECTED NOT DETECTED Final   Enterococcus Faecium NOT DETECTED NOT DETECTED Final   Listeria monocytogenes NOT DETECTED NOT DETECTED Final   Staphylococcus species DETECTED (A) NOT DETECTED Final    Comment: CRITICAL RESULT CALLED TO, READ BACK BY AND VERIFIED WITH: PHARMD UTOMWEN, A 1327 [id number] FCP    Staphylococcus aureus (BCID) DETECTED (A) NOT DETECTED Final    Comment: CRITICAL RESULT CALLED TO, READ BACK BY AND VERIFIED WITH: PHARMD UTOMWEN, A 1327 [id number] FCP    Staphylococcus epidermidis NOT DETECTED NOT DETECTED Final   Staphylococcus lugdunensis NOT DETECTED NOT DETECTED Final   Streptococcus species NOT DETECTED NOT DETECTED Final   Streptococcus agalactiae NOT DETECTED NOT DETECTED Final   Streptococcus pneumoniae NOT DETECTED NOT DETECTED Final   Streptococcus pyogenes NOT DETECTED NOT DETECTED Final   A.calcoaceticus-baumannii NOT DETECTED NOT DETECTED Final   Bacteroides fragilis NOT DETECTED NOT DETECTED Final   Enterobacterales NOT DETECTED NOT DETECTED Final   Enterobacter cloacae complex NOT DETECTED NOT DETECTED Final   Escherichia coli NOT DETECTED NOT DETECTED Final   Klebsiella aerogenes NOT DETECTED NOT DETECTED Final   Klebsiella oxytoca NOT DETECTED NOT DETECTED Final   Klebsiella pneumoniae NOT DETECTED NOT DETECTED Final   Proteus species NOT DETECTED NOT DETECTED Final   Salmonella species NOT DETECTED NOT DETECTED Final   Serratia marcescens NOT DETECTED NOT DETECTED Final   Haemophilus influenzae NOT DETECTED NOT DETECTED Final   Neisseria meningitidis NOT DETECTED NOT DETECTED Final   Pseudomonas aeruginosa NOT DETECTED NOT DETECTED Final   Stenotrophomonas maltophilia NOT DETECTED NOT DETECTED Final   Candida albicans NOT DETECTED NOT DETECTED Final   Candida auris NOT DETECTED NOT DETECTED Final   Candida glabrata NOT DETECTED NOT DETECTED Final    Candida krusei NOT DETECTED NOT DETECTED Final   Candida parapsilosis NOT DETECTED NOT DETECTED Final   Candida tropicalis NOT DETECTED NOT DETECTED Final   Cryptococcus neoformans/gattii NOT DETECTED NOT DETECTED Final   Meth resistant mecA/C and MREJ NOT DETECTED NOT DETECTED Final    Comment: Performed at [facility], [address] [place], [place] [address]  MRSA Next Gen by PCR, Nasal     Status: None   Collection Time: 10/29/24 11:12 PM   Specimen: Nasal Mucosa; Nasal Swab  Result Value Ref Range Status   MRSA by PCR Next Gen NOT DETECTED NOT DETECTED Final    Comment: (NOTE) The GeneXpert MRSA Assay (FDA approved for NASAL specimens only), is one component of a comprehensive MRSA colonization surveillance program. It is not intended to diagnose MRSA infection nor to guide or monitor treatment for MRSA infections. Test performance is not FDA approved in patients less than 37 years old. Performed at [facility], [address] [place], [place] [address]   Urine Culture     Status: Abnormal   Collection Time: 10/30/24  4:00 AM   Specimen: Urine, Random  Result Value Ref Range Status   Specimen Description   Final    URINE, RANDOM Performed at [facility], [address], [place], [place] [address]    Special Requests   Final    NONE Reflexed from [id number] Performed at [facility], [address] [place], [place] [address]    Culture (A)  Final    <10,000 COLONIES/mL INSIGNIFICANT GROWTH Performed at [facility], [address] [place], [place] [address]    Report Status 10/31/2024 FINAL  Final  C Difficile Quick Screen w PCR reflex     Status: None   Collection Time: 10/30/24  4:44 PM   Specimen: STOOL  Result Value Ref Range Status   C Diff antigen NEGATIVE NEGATIVE Final   C Diff toxin NEGATIVE NEGATIVE Final   C Diff interpretation No C. difficile detected.  Final    Comment: Performed at  [facility], [address] [place], [place] [address]  Gastrointestinal Panel by PCR , Stool     Status: None   Collection Time: 10/30/24  4:45 PM   Specimen: STOOL  Result Value Ref Range Status   Campylobacter species NOT DETECTED NOT DETECTED Final   Plesimonas shigelloides NOT DETECTED NOT DETECTED Final   Salmonella species NOT DETECTED NOT DETECTED Final   Yersinia enterocolitica NOT DETECTED NOT DETECTED Final   Vibrio species NOT DETECTED NOT DETECTED Final   Vibrio cholerae NOT DETECTED NOT DETECTED Final   Enteroaggregative E coli (EAEC) NOT DETECTED NOT DETECTED Final   Enteropathogenic E coli (EPEC) NOT DETECTED NOT DETECTED Final   Enterotoxigenic E coli (ETEC) NOT DETECTED NOT DETECTED Final   Shiga like toxin producing E coli (STEC) NOT DETECTED NOT DETECTED Final   Shigella/Enteroinvasive E coli (EIEC) NOT DETECTED NOT DETECTED Final   Cryptosporidium NOT DETECTED NOT DETECTED Final   Cyclospora cayetanensis NOT DETECTED NOT DETECTED Final   Entamoeba histolytica NOT DETECTED NOT DETECTED Final   Giardia lamblia NOT DETECTED NOT DETECTED Final   Adenovirus F40/41 NOT DETECTED NOT DETECTED Final   Astrovirus NOT DETECTED NOT DETECTED Final   Norovirus GI/GII NOT DETECTED NOT DETECTED Final   Rotavirus A NOT DETECTED NOT DETECTED Final   Sapovirus (I, II, IV, and V) NOT DETECTED NOT DETECTED Final    Comment: Performed at [facility], [address] [place], [place] [address]  Culture, blood (Routine X 2) w Reflex to ID Panel     Status: None   Collection Time: 10/31/24  3:11 AM   Specimen: BLOOD  Result Value Ref Range Status   Specimen Description   Final    BLOOD SITE NOT SPECIFIED Performed at [facility], [address], [place], [place] [address]    Special Requests   Final    BOTTLES DRAWN AEROBIC ONLY Blood Culture results may not be optimal due to an inadequate volume of blood received in culture bottles Performed at  [facility], [address] [place], [place] [address]    Culture   Final    NO GROWTH 5 DAYS Performed at [facility], [address], [place], [place] [address]    Report Status 11/05/2024 FINAL  Final  Culture, blood (Routine X 2) w Reflex to ID Panel     Status: None   Collection Time: 10/31/24  3:11 AM   Specimen: BLOOD LEFT HAND  Result Value Ref Range Status   Specimen Description  Final    BLOOD LEFT HAND Performed at [facility], [address] [place], [place] [address]    Special Requests   Final    BOTTLES DRAWN AEROBIC ONLY Blood Culture results may not be optimal due to an inadequate volume of blood received in culture bottles Performed at [facility], [address] [place], [place] [address]    Culture   Final    NO GROWTH 5 DAYS Performed at [facility], [address], [place], [place] [address]    Report Status 11/05/2024 FINAL  Final    Radiology Studies: No results found.    Delyle Weider T. Marlyce Mcdougald [facility]  If 7PM-7AM, please contact night-coverage [email or web address] 11/05/2024, 12:25 PM   "

## 2024-11-05 NOTE — TOC Progression Note (Addendum)
 Transition of Care [facility]) - Progression Note    Patient Details  Name: Jody Shaw MRN: [id number] Date of Birth: 11/07/1941  [facility] CM/SW Contact  Jon ONEIDA Anon, RN Phone Number: 11/05/2024, 9:11 AM  Clinical Narrative:    RNCM met with pt and pt son to discuss SNF placement recommendation. Bed offers were discussed and given to pt son at bedside. Pt son inquired about Star ratings for the facilities and RNCM gave information for pt son to investigate further on Medicare.gov website. Pt/ pt son state they need time to discuss bed offers with other family members. ICM will follow-up for bed choice. ICM continuing to follow for DC planning needs.  Addendum:  Pt son called stating that he spoke with family and they would like to pursue SNF in the [place] area. They would like referrals sent to  -[facility] -[facility]- [place]- made call at [phone] and receptionist states Admissions Coordinator has gone home for the day [facility]- Left VM for [facility], awaiting a call back -[facility] with Rexene at [phone] and faxed over referral. States will not have any bed availability until the middle to end of next week -[facility] [place], which appears to be a care home  Received a secure chat from Floor RN stating family at bedside requesting referrals be made to the [place] area. Informed RN that RNCM had spoken to pt son Nevada Kirchner via telephone at [phone] about this and pt daughter Richardson Kays at  [phone], where she states she lives in the [place] area and it would be beneficial to her if pt was close by. Explained to pt son and daughter that if pt would need transportation via ambulance service it would be an out of pocket expense to them and they both voiced understanding. ICM will continue to try and reach facilities in the [place] area. ICM continuing to follow for DC planning  needs.      The following bed offer were presented to the pt/ pt son:  [facility] Preferred SNF  [address], [place] [address] [phone]        [facility]  [address] [place] [place] [address] [phone]         1 star  [facility]  [address] [place], [place] [address] [phone]         3 stars  [facility] Preferred SNF  [address] [place] [address] [phone]         1 star      Expected Discharge Plan: Skilled Nursing Facility Barriers to Discharge: Continued Medical Work up               Expected Discharge Plan and Services In-house Referral: Hospice / Palliative Care Discharge Planning Services: CM Consult Post Acute Care Choice: Durable Medical Equipment Living arrangements for the past 2 months: Single Family Home                 DME Arranged: N/A DME Agency: NA       HH Arranged: NA HH Agency: NA         Social Drivers of Health (SDOH) Interventions SDOH Screenings   Food Insecurity: No Food Insecurity (11/01/2024)  Housing: Low Risk (11/01/2024)  Transportation Needs: No Transportation Needs (11/01/2024)  Utilities: Not At Risk (11/01/2024)  Financial  Resource Strain: Low Risk (10/16/2023)   Received from [facility]  Social Connections: Socially Isolated (11/02/2024)  Tobacco Use: Medium Risk (11/04/2024)    Readmission Risk Interventions    11/03/2024   12:03 PM 10/31/2024    2:41 PM 03/25/2024    2:11 PM  Readmission Risk Prevention Plan  Transportation Screening Complete Complete Complete  PCP or Specialist Appt within 5-7 Days Complete Complete Complete  Home Care Screening Complete Complete Complete  Medication Review (RN CM) Complete Complete Complete

## 2024-11-06 DIAGNOSIS — I890 Lymphedema, not elsewhere classified: Secondary | ICD-10-CM | POA: Diagnosis not present

## 2024-11-06 DIAGNOSIS — T68XXXD Hypothermia, subsequent encounter: Secondary | ICD-10-CM | POA: Diagnosis not present

## 2024-11-06 DIAGNOSIS — I959 Hypotension, unspecified: Secondary | ICD-10-CM | POA: Diagnosis not present

## 2024-11-06 DIAGNOSIS — R6521 Severe sepsis with septic shock: Secondary | ICD-10-CM | POA: Diagnosis not present

## 2024-11-06 DIAGNOSIS — R7881 Bacteremia: Secondary | ICD-10-CM | POA: Diagnosis not present

## 2024-11-06 LAB — RETICULOCYTES
Immature Retic Fract: 23 % — ABNORMAL HIGH (ref 2.3–15.9)
RBC.: 2.59 MIL/uL — ABNORMAL LOW (ref 3.87–5.11)
Retic Count, Absolute: 18.9 K/uL — ABNORMAL LOW (ref 19.0–186.0)
Retic Ct Pct: 0.7 % (ref 0.4–3.1)

## 2024-11-06 LAB — CBC WITH DIFFERENTIAL/PLATELET
Abs Immature Granulocytes: 2.12 K/uL — ABNORMAL HIGH (ref 0.00–0.07)
Basophils Absolute: 0.1 K/uL (ref 0.0–0.1)
Basophils Relative: 0 %
Eosinophils Absolute: 0 K/uL (ref 0.0–0.5)
Eosinophils Relative: 0 %
HCT: 23.6 % — ABNORMAL LOW (ref 36.0–46.0)
Hemoglobin: 7.2 g/dL — ABNORMAL LOW (ref 12.0–15.0)
Immature Granulocytes: 9 %
Lymphocytes Relative: 2 %
Lymphs Abs: 0.4 K/uL — ABNORMAL LOW (ref 0.7–4.0)
MCH: 26.7 pg (ref 26.0–34.0)
MCHC: 30.5 g/dL (ref 30.0–36.0)
MCV: 87.4 fL (ref 80.0–100.0)
Monocytes Absolute: 1 K/uL (ref 0.1–1.0)
Monocytes Relative: 4 %
Neutro Abs: 20.6 K/uL — ABNORMAL HIGH (ref 1.7–7.7)
Neutrophils Relative %: 85 %
Platelets: 198 K/uL (ref 150–400)
RBC: 2.7 MIL/uL — ABNORMAL LOW (ref 3.87–5.11)
RDW: 20.4 % — ABNORMAL HIGH (ref 11.5–15.5)
WBC: 24.3 K/uL — ABNORMAL HIGH (ref 4.0–10.5)
nRBC: 0.1 % (ref 0.0–0.2)

## 2024-11-06 LAB — COMPREHENSIVE METABOLIC PANEL WITH GFR
ALT: 8 U/L (ref 0–44)
AST: 11 U/L — ABNORMAL LOW (ref 15–41)
Albumin: 2.4 g/dL — ABNORMAL LOW (ref 3.5–5.0)
Alkaline Phosphatase: 109 U/L (ref 38–126)
Anion gap: 15 (ref 5–15)
BUN: 53 mg/dL — ABNORMAL HIGH (ref 8–23)
CO2: 17 mmol/L — ABNORMAL LOW (ref 22–32)
Calcium: 8.6 mg/dL — ABNORMAL LOW (ref 8.9–10.3)
Chloride: 112 mmol/L — ABNORMAL HIGH (ref 98–111)
Creatinine, Ser: 2.58 mg/dL — ABNORMAL HIGH (ref 0.44–1.00)
GFR, Estimated: 18 mL/min — ABNORMAL LOW
Glucose, Bld: 110 mg/dL — ABNORMAL HIGH (ref 70–99)
Potassium: 4.6 mmol/L (ref 3.5–5.1)
Sodium: 144 mmol/L (ref 135–145)
Total Bilirubin: <0.2 mg/dL (ref 0.0–1.2)
Total Protein: 5.6 g/dL — ABNORMAL LOW (ref 6.5–8.1)

## 2024-11-06 LAB — IRON AND TIBC
Iron: 29 ug/dL (ref 28–170)
Saturation Ratios: 21 % (ref 10.4–31.8)
TIBC: 138 ug/dL — ABNORMAL LOW (ref 250–450)
UIBC: 109 ug/dL

## 2024-11-06 LAB — GLUCOSE, CAPILLARY
Glucose-Capillary: 106 mg/dL — ABNORMAL HIGH (ref 70–99)
Glucose-Capillary: 109 mg/dL — ABNORMAL HIGH (ref 70–99)
Glucose-Capillary: 110 mg/dL — ABNORMAL HIGH (ref 70–99)
Glucose-Capillary: 113 mg/dL — ABNORMAL HIGH (ref 70–99)
Glucose-Capillary: 114 mg/dL — ABNORMAL HIGH (ref 70–99)
Glucose-Capillary: 144 mg/dL — ABNORMAL HIGH (ref 70–99)

## 2024-11-06 LAB — FOLATE: Folate: 5.3 ng/mL — ABNORMAL LOW

## 2024-11-06 LAB — VITAMIN B12: Vitamin B-12: 1203 pg/mL — ABNORMAL HIGH (ref 180–914)

## 2024-11-06 LAB — PHOSPHORUS: Phosphorus: 4.9 mg/dL — ABNORMAL HIGH (ref 2.5–4.6)

## 2024-11-06 LAB — FERRITIN: Ferritin: 243 ng/mL (ref 11–307)

## 2024-11-06 LAB — MAGNESIUM: Magnesium: 2.2 mg/dL (ref 1.7–2.4)

## 2024-11-06 MED ORDER — SODIUM CHLORIDE 0.9% FLUSH: 10.0000 mL | INTRAVENOUS | Status: DC | PRN

## 2024-11-06 MED ORDER — SODIUM CHLORIDE 0.9% FLUSH
10.0000 mL | Freq: Two times a day (BID) | INTRAVENOUS | Status: DC
  Administered 2024-11-06: 10 mL

## 2024-11-06 MED ORDER — ALBUTEROL SULFATE (2.5 MG/3ML) 0.083% IN NEBU
2.5000 mg | INHALATION_SOLUTION | RESPIRATORY_TRACT | Status: DC | PRN
  Administered 2024-11-07 – 2024-11-08 (×3): 2.5 mg via RESPIRATORY_TRACT
  Filled 2024-11-06 (×3): qty 3

## 2024-11-06 MED ORDER — ALBUTEROL SULFATE (2.5 MG/3ML) 0.083% IN NEBU
2.5000 mg | INHALATION_SOLUTION | Freq: Two times a day (BID) | RESPIRATORY_TRACT | Status: DC
  Administered 2024-11-06 – 2024-11-07 (×3): 2.5 mg via RESPIRATORY_TRACT
  Filled 2024-11-06 (×4): qty 3

## 2024-11-06 NOTE — Progress Notes (Signed)
" °  Daily Progress Note   Patient Name: Jody Shaw       Date: 11/06/2024 DOB: 08/19/1941  Age: 83 y.o. MRN#: [id number] Attending Physician: Kathrin Mignon DASEN, MD Primary Care Physician: Nichole Senior, MD Admit Date: 10/29/2024 Length of Stay: 8 days  Reason for Consultation/Follow-up: Establishing goals of care  Subjective:   Reviewed EMR including recent documentation from [facility] MD Now out of the ICU Awake alert, resting in bed, states that she is wore out, son [place] at bedside.     Objective:   Vital Signs:  BP (!) 97/58 (BP Location: Left Arm)   Pulse 86   Temp 97.6 F (36.4 C) (Oral)   Resp 14   Ht 5' 2 (1.575 m)   Wt 93.7 kg   SpO2 95%   BMI 37.78 kg/m   Physical Exam: General: NAD, chronically ill-appearing, awakens and responds well.  Cardiovascular: RRR Respiratory: no increased work of breathing noted, not in respiratory distress Abdomen: not distended Neuro: alert, awake  Assessment & Plan:   Assessment: Patient is an 83 year old female with a past medical history of CAD, lymphedema, CKD stage IIIb, anemia, GERD, hypothyroidism, giant cell arteritis, hypothyroidism, HTN, and HLD who was sent to the ED by her PCP on 10/29/2024 for evaluation of possible pneumonia and elevated creatinine.  Chest x-ray at PCPs office showed pneumonia however repeat chest x-ray and CT chest on admission with no acute abnormalities. Patient is admitted with severe sepsis secondary to UTI, concern for pneumonia, lactic acidosis, and AKI on CKD. Palliative Medicine has been consulted for goals of care discussions and complex medical decision making.   Recommendations/Plan: # Complex medical decision making/goals of care:  - Patient awake resting in bed. Son at bedside.   Discussed with son about scope of current hospitalization, pulled up labs and discussed WBC count and renal function. Goals of care re discussed - SNF rehab with palliative and then patient will need re  assessment for whether she will need hospice services.    -  Code Status: Limited: Do not attempt resuscitation (DNR) -DNR-LIMITED -Do Not Intubate/DNI   #Symptom management Monitor for pain and non-pain symptoms.   # Psychosocial Support:  - Daughter-Joanne, Richardson who live locally, son Lemond who has arrived from [place] .   # Discharge Planning: SNF rehab with palliative.   Discussed with: Patient, patient's son Lemond, bedside RN  Thank you for allowing the palliative care team to participate in the care Ronae A Fasching. High MDM Lonia Serve MD.  Palliative Care Provider PMT # [phone]  If patient remains symptomatic despite maximum doses, please call PMT at [phone] between 0700 and 1900. Outside of these hours, please call attending, as PMT does not have night coverage.   "

## 2024-11-06 NOTE — Progress Notes (Signed)
 " PROGRESS NOTE  Jody Shaw FMW:[id number] DOB: 1941/05/04   PCP: Nichole Senior, MD  Patient is from: Home.  DOA: 10/29/2024 LOS: 8  Chief complaints Chief Complaint  Patient presents with   Weakness    Pt dx with PNA today @ PCP,  pt coming from PCP with c/o weakness- generalized, malaise, hallucination, abnormal lab with creatinine of 3. Pt reports illness x 1 week, a/o x4.       Brief Narrative / Interim history: 83 y/o F with giant cell arteritis, lymphedema, chronic leukocytosis, CAD presenting for weakness and evaluation of possible PNA, and found to have septic shock in the setting of MSSA bacteremia.  Noted to have Cr 2.76 (was 1.6 in 03/2024), WBC 13.3 and hypothermia. Rectal temps 91 and 93 degrees. Also noted to have hypotension with MAPs < 60 and requited pressors. Hospital course complicated by confusion. Eventually found to have MSSA bacteremia.  Repeat blood cultures NGTD.  TTE without vegetation.  Declined TEE.  ID recommended IV Ancef  for 6 weeks starting 10/31/2024.  On 12/24, hypotensive, lethargic with low temperatures.   Subjective: Seen and examined earlier this morning.  No major events overnight or this morning.  No complaints other than feeling tired.  BP and temperature normalized.  Denies chest pain, shortness of breath, GI or UTI symptoms.  No report of melena or hematochezia.  Sitting in bed eating breakfast.  Son at bedside.   Assessment and plan: Septic shock, MSSA bacteremia: Present on admission.  Hypothermia, hypotension, leukocytosis, lactic acidosis and AKI.  Repeat blood cultures NGTD.  TTE without vegetation.  Reportedly declined TEE.  Sepsis physiology resolved except for leukocytosis. -Continue IV Ancef  for 6 weeks starting from 12/19 per recommendation by ID. -Continue p.o. midodrine  and increase dose of steroid. -Her leukocytosis is acute on chronic, could be leukemoid reaction. -Needs PICC line before discharge.     Delirium: In the  setting of the above.  Resolved.  Currently awake and alert and oriented appropriately. -Reorientation and delirium precaution -Minimize sedating medications   AKI on CKD-3B: Cr was 1.6 in 03/2024.  Might have progressed to CKD-4.  Stable. Recent Labs    03/28/24 0329 10/29/24 1441 10/30/24 0546 10/31/24 0311 11/01/24 0250 11/02/24 0310 11/03/24 0242 11/04/24 0300 11/05/24 0304 11/06/24 0538  BUN 83* 61* 49* 48* 46* 49* 51* 55* 55* 53*  CREATININE 1.67* 2.76* 2.36* 2.43* 2.51* 2.78* 3.05* 2.79* 2.74* 2.58*  - Continue monitoring - Manage hypertension as above - Previous attending discussed with patient's son who does not want her to have dialysis if renal function continues to decline.  Normocytic anemia: H&H relatively stable.  Denies melena or hematochezia. Recent Labs    10/29/24 1441 10/30/24 0709 10/30/24 1525 10/31/24 0311 11/01/24 0250 11/02/24 0310 11/03/24 0242 11/04/24 0300 11/05/24 0304 11/06/24 0538  HGB 8.9* 6.4* 9.0* 9.4* 9.0* 8.7* 7.4* 7.9* 8.0* 7.2*  - Check anemia panel. - Continue monitoring  Hypotension/lethargy: Improved after p.o. midodrine  and increasing p.o. prednisone . - Continue p.o. midodrine  - Start weaning p.o. prednisone  to home dose.    Chronic acquired lymphedema - cont ACE wraps of legs  History of giant cell arteritis -Continue home prednisone .   Anxiety and depression: Difficult to assess given lethargy.  - Continue Zoloft    Generalized weakness  -Needing a Hoyer lift- will be going to SNF as family is not able to care for her in this situations  Leukocytosis: Acute on chronic.  Leukemoid reaction? - Antibiotics as above - Continue  monitoring  Class II obesity Body mass index is 37.78 kg/m.         Pressure skin injury: Present on admission. Wound 10/30/24 Pressure Injury Buttocks Left Stage 3 -  Full thickness tissue loss. Subcutaneous fat may be visible but bone, tendon or muscle are NOT exposed. (Active)      Wound 10/30/24 Pressure Injury Foot Left;Posterior Deep Tissue Pressure Injury - Purple or maroon localized area of discolored intact skin or blood-filled blister due to damage of underlying soft tissue from pressure and/or shear. (Active)   DVT prophylaxis:  heparin  injection 5,000 Units Start: 10/31/24 2200  Code Status: DNR Family Communication: Updated patient's son at bedside Level of care: Progressive Status is: Inpatient Remains inpatient appropriate because: Hypotension, MSSA bacteremia and lethargy   Final disposition: SNF   55 minutes with more than 50% spent in reviewing records, counseling patient/family and coordinating care.  Consultants:  Infectious disease Critical care Palliative care  Procedures: None  Microbiology summarized: 12/17-COVID-19, influenza and RSV PCR nonreactive 12/17-20 pathogen RVP nonreactive 12/17-MRSA PCR screen nonreactive 12/17-blood culture with MSSA 12/18-urine culture with insignificant growth 12/18-C. difficile negative 12/19-blood cultures NGTD  Objective: Vitals:   11/06/24 0152 11/06/24 0546 11/06/24 0940 11/06/24 1126  BP: (!) 112/56 121/66  (!) 97/58  Pulse: 74 73  86  Resp: 18 18  14   Temp: 98.8 F (37.1 C) (!) 97.5 F (36.4 C)  97.6 F (36.4 C)  TempSrc:  Oral  Oral  SpO2: 98% 95% 95% 95%  Weight:      Height:        Examination:  GENERAL: No apparent distress.  Nontoxic. HEENT: MMM.  Vision and hearing grossly intact.  NECK: Supple.  No apparent JVD.  RESP:  No IWOB.  Fair aeration bilaterally. CVS:  RRR. Heart sounds normal.  ABD/GI/GU: BS+. Abd soft, NTND.  MSK/EXT: Ace wrap over BLE. NEURO: Awake and alert and oriented x 4.  Follows commands. PSYCH: Calm. Normal affect.   Sch Meds:  Scheduled Meds:  albuterol   2.5 mg Nebulization BID   aspirin  EC  81 mg Oral Daily   Chlorhexidine  Gluconate Cloth  6 each Topical Daily   clotrimazole    Topical BID   famotidine   20 mg Oral Daily   fiber  1 packet  Oral BID   heparin  injection (subcutaneous)  5,000 Units Subcutaneous Q8H   levothyroxine   88 mcg Oral Q0600   melatonin  3 mg Oral QHS   midodrine   5 mg Oral TID WC   nystatin   5 mL Oral QID   predniSONE   20 mg Oral Q breakfast   rosuvastatin   10 mg Oral Once per day on Sunday Wednesday   sertraline   25 mg Oral Daily   umeclidinium-vilanterol  1 puff Inhalation Daily   Continuous Infusions:   ceFAZolin  (ANCEF ) IV 2 g (11/06/24 1002)   PRN Meds:.acetaminophen  **OR** acetaminophen , albuterol , artificial tears, bisacodyl , guaiFENesin , loperamide , ondansetron  **OR** ondansetron  (ZOFRAN ) IV, mouth rinse, senna-docusate  Antimicrobials: Anti-infectives (From admission, onward)    Start     Dose/Rate Route Frequency Ordered Stop   11/04/24 1000  ceFAZolin  (ANCEF ) IVPB 2g/100 mL premix  Status:  Discontinued        2 g 200 mL/hr over 30 Minutes Intravenous Every 24 hours 11/03/24 1033 11/03/24 1323   11/03/24 2200  ceFAZolin  (ANCEF ) IVPB 2g/100 mL premix        2 g 200 mL/hr over 30 Minutes Intravenous Every 12 hours 11/03/24 1323  10/30/24 1600  azithromycin  (ZITHROMAX ) 500 mg in sodium chloride  0.9 % 250 mL IVPB  Status:  Discontinued        500 mg 250 mL/hr over 60 Minutes Intravenous Every 24 hours 10/30/24 1225 10/30/24 1347   10/30/24 1400  cefTRIAXone  (ROCEPHIN ) 2 g in sodium chloride  0.9 % 100 mL IVPB  Status:  Discontinued        2 g 200 mL/hr over 30 Minutes Intravenous Every 24 hours 10/30/24 1225 10/30/24 1347   10/30/24 1400  ceFAZolin  (ANCEF ) IVPB 2g/100 mL premix  Status:  Discontinued        2 g 200 mL/hr over 30 Minutes Intravenous Every 8 hours 10/30/24 1347 10/30/24 1352   10/30/24 1400  ceFAZolin  (ANCEF ) IVPB 2g/100 mL premix  Status:  Discontinued        2 g 200 mL/hr over 30 Minutes Intravenous Every 12 hours 10/30/24 1352 11/03/24 1033   10/29/24 1445  cefTRIAXone  (ROCEPHIN ) 2 g in sodium chloride  0.9 % 100 mL IVPB        2 g 200 mL/hr over 30 Minutes  Intravenous Once 10/29/24 1430 10/29/24 1525   10/29/24 1445  azithromycin  (ZITHROMAX ) 500 mg in sodium chloride  0.9 % 250 mL IVPB        500 mg 250 mL/hr over 60 Minutes Intravenous  Once 10/29/24 1430 10/29/24 1629        I have personally reviewed the following labs and images: CBC: Recent Labs  Lab 11/02/24 0310 11/03/24 0242 11/04/24 0300 11/05/24 0304 11/06/24 0538  WBC 17.4* 16.2* 15.4* 23.4* 24.3*  NEUTROABS 16.0* 14.5* 13.6* 19.9* 20.6*  HGB 8.7* 7.4* 7.9* 8.0* 7.2*  HCT 27.9* 23.5* 25.3* 25.5* 23.6*  MCV 85.6 84.8 87.2 86.1 87.4  PLT 206 182 168 162 198   BMP &GFR Recent Labs  Lab 11/02/24 0310 11/03/24 0242 11/04/24 0300 11/05/24 0304 11/06/24 0538  NA 144 143 141 142 144  K 4.1 4.0 4.8 4.2 4.6  CL 114* 112* 112* 110 112*  CO2 15* 17* 16* 16* 17*  GLUCOSE 125* 100* 104* 98 110*  BUN 49* 51* 55* 55* 53*  CREATININE 2.78* 3.05* 2.79* 2.74* 2.58*  CALCIUM  7.9* 8.0* 8.0* 8.2* 8.6*  MG 2.1 2.3 2.2 2.0 2.2  PHOS 4.6 4.7* 5.0* 4.8* 4.9*   Estimated Creatinine Clearance: 17 mL/min (A) (by C-G formula based on SCr of 2.58 mg/dL (H)). Liver & Pancreas: Recent Labs  Lab 10/31/24 0311 11/06/24 0538  AST 23 11*  ALT 15 8  ALKPHOS 99 109  BILITOT 0.2 <0.2  PROT 5.2* 5.6*  ALBUMIN  2.4* 2.4*   No results for input(s): LIPASE, AMYLASE in the last 168 hours. No results for input(s): AMMONIA in the last 168 hours. Diabetic: No results for input(s): HGBA1C in the last 72 hours. Recent Labs  Lab 11/05/24 2036 11/06/24 0004 11/06/24 0509 11/06/24 0741 11/06/24 1123  GLUCAP 116* 113* 106* 110* 109*   Cardiac Enzymes: No results for input(s): CKTOTAL, CKMB, CKMBINDEX, TROPONINI in the last 168 hours. No results for input(s): PROBNP in the last 8760 hours. Coagulation Profile: No results for input(s): INR, PROTIME in the last 168 hours.  Thyroid Function Tests: No results for input(s): TSH, T4TOTAL, FREET4, T3FREE,  THYROIDAB in the last 72 hours. Lipid Profile: No results for input(s): CHOL, HDL, LDLCALC, TRIG, CHOLHDL, LDLDIRECT in the last 72 hours. Anemia Panel: No results for input(s): VITAMINB12, FOLATE, FERRITIN, TIBC, IRON, RETICCTPCT in the last 72 hours. Urine analysis:  Component Value Date/Time   COLORURINE YELLOW 10/30/2024 0400   APPEARANCEUR HAZY (A) 10/30/2024 0400   LABSPEC 1.012 10/30/2024 0400   PHURINE 5.0 10/30/2024 0400   GLUCOSEU NEGATIVE 10/30/2024 0400   HGBUR NEGATIVE 10/30/2024 0400   BILIRUBINUR NEGATIVE 10/30/2024 0400   KETONESUR NEGATIVE 10/30/2024 0400   PROTEINUR NEGATIVE 10/30/2024 0400   NITRITE NEGATIVE 10/30/2024 0400   LEUKOCYTESUR SMALL (A) 10/30/2024 0400   Sepsis Labs: Invalid input(s): PROCALCITONIN, LACTICIDVEN  Microbiology: Recent Results (from the past 240 hours)  Resp panel by RT-PCR (RSV, Flu A&B, Covid)     Status: None   Collection Time: 10/29/24  2:31 PM   Specimen: Nasal Swab  Result Value Ref Range Status   SARS Coronavirus 2 by RT PCR NEGATIVE NEGATIVE Final    Comment: (NOTE) SARS-CoV-2 target nucleic acids are NOT DETECTED.  The SARS-CoV-2 RNA is generally detectable in upper respiratory specimens during the acute phase of infection. The lowest concentration of SARS-CoV-2 viral copies this assay can detect is 138 copies/mL. A negative result does not preclude SARS-Cov-2 infection and should not be used as the sole basis for treatment or other patient management decisions. A negative result may occur with  improper specimen collection/handling, submission of specimen other than nasopharyngeal swab, presence of viral mutation(s) within the areas targeted by this assay, and inadequate number of viral copies(<138 copies/mL). A negative result must be combined with clinical observations, patient history, and epidemiological information. The expected result is Negative.  Fact Sheet for Patients:   bloggercourse.com  Fact Sheet for Healthcare Providers:  seriousbroker.it  This test is no t yet approved or cleared by the [place]  FDA and  has been authorized for detection and/or diagnosis of SARS-CoV-2 by FDA under an Emergency Use Authorization (EUA). This EUA will remain  in effect (meaning this test can be used) for the duration of the COVID-19 declaration under Section 564(b)(1) of the Act, 21 U.S.C.section 360bbb-3(b)(1), unless the authorization is terminated  or revoked sooner.       Influenza A by PCR NEGATIVE NEGATIVE Final   Influenza B by PCR NEGATIVE NEGATIVE Final    Comment: (NOTE) The Xpert Xpress SARS-CoV-2/FLU/RSV plus assay is intended as an aid in the diagnosis of influenza from Nasopharyngeal swab specimens and should not be used as a sole basis for treatment. Nasal washings and aspirates are unacceptable for Xpert Xpress SARS-CoV-2/FLU/RSV testing.  Fact Sheet for Patients: bloggercourse.com  Fact Sheet for Healthcare Providers: seriousbroker.it  This test is not yet approved or cleared by the [place]  FDA and has been authorized for detection and/or diagnosis of SARS-CoV-2 by FDA under an Emergency Use Authorization (EUA). This EUA will remain in effect (meaning this test can be used) for the duration of the COVID-19 declaration under Section 564(b)(1) of the Act, 21 U.S.C. section 360bbb-3(b)(1), unless the authorization is terminated or revoked.     Resp Syncytial Virus by PCR NEGATIVE NEGATIVE Final    Comment: (NOTE) Fact Sheet for Patients: bloggercourse.com  Fact Sheet for Healthcare Providers: seriousbroker.it  This test is not yet approved or cleared by the [place]  FDA and has been authorized for detection and/or diagnosis of SARS-CoV-2 by FDA under an Emergency Use  Authorization (EUA). This EUA will remain in effect (meaning this test can be used) for the duration of the COVID-19 declaration under Section 564(b)(1) of the Act, 21 U.S.C. section 360bbb-3(b)(1), unless the authorization is terminated or revoked.  Performed at [facility], [address] [place], [place] [address]   Respiratory (~20 pathogens) panel by PCR     Status: None   Collection Time: 10/29/24  2:31 PM   Specimen: Nasopharyngeal Swab; Respiratory  Result Value Ref Range Status   Adenovirus NOT DETECTED NOT DETECTED Final   Coronavirus 229E NOT DETECTED NOT DETECTED Final    Comment: (NOTE) The Coronavirus on the Respiratory Panel, DOES NOT test for the novel  Coronavirus (2019 nCoV)    Coronavirus HKU1 NOT DETECTED NOT DETECTED Final   Coronavirus NL63 NOT DETECTED NOT DETECTED Final   Coronavirus OC43 NOT DETECTED NOT DETECTED Final   Metapneumovirus NOT DETECTED NOT DETECTED Final   Rhinovirus / Enterovirus NOT DETECTED NOT DETECTED Final   Influenza A NOT DETECTED NOT DETECTED Final   Influenza B NOT DETECTED NOT DETECTED Final   Parainfluenza Virus 1 NOT DETECTED NOT DETECTED Final   Parainfluenza Virus 2 NOT DETECTED NOT DETECTED Final   Parainfluenza Virus 3 NOT DETECTED NOT DETECTED Final   Parainfluenza Virus 4 NOT DETECTED NOT DETECTED Final   Respiratory Syncytial Virus NOT DETECTED NOT DETECTED Final   Bordetella pertussis NOT DETECTED NOT DETECTED Final   Bordetella Parapertussis NOT DETECTED NOT DETECTED Final   Chlamydophila pneumoniae NOT DETECTED NOT DETECTED Final   Mycoplasma pneumoniae NOT DETECTED NOT DETECTED Final    Comment: Performed at [facility], [address] [place], [place] [address]  Blood Culture (routine x 2)     Status: Abnormal   Collection Time: 10/29/24  3:25 PM   Specimen: BLOOD  Result Value Ref Range Status   Specimen Description   Final    BLOOD SITE NOT SPECIFIED Performed at [facility], [address], [place], [place] [address]    Special Requests   Final    BOTTLES DRAWN AEROBIC AND ANAEROBIC Blood Culture results may not be optimal due to an inadequate volume of blood received in culture bottles Performed at [facility], [address], [place], [place] [address]    Culture  Setup Time   Final    GRAM POSITIVE COCCI IN CLUSTERS IN BOTH AEROBIC AND ANAEROBIC BOTTLES CRITICAL VALUE NOTED.  VALUE IS CONSISTENT WITH PREVIOUSLY REPORTED AND CALLED VALUE.    Culture (A)  Final    STAPHYLOCOCCUS AUREUS SUSCEPTIBILITIES PERFORMED ON PREVIOUS CULTURE WITHIN THE LAST 5 DAYS. Performed at [facility], [address] [place], [place] [address]    Report Status 11/01/2024 FINAL  Final  Blood Culture (routine x 2)     Status: Abnormal   Collection Time: 10/29/24  3:40 PM   Specimen: BLOOD  Result Value Ref Range Status   Specimen Description   Final    BLOOD SITE NOT SPECIFIED Performed at [facility], [address] [place], [place] [address]    Special Requests   Final    BOTTLES DRAWN AEROBIC AND ANAEROBIC Blood Culture results may not be optimal due to an inadequate volume of blood received in culture bottles Performed at [facility], [address] [place], [place] [address]    Culture  Setup Time   Final    GRAM POSITIVE COCCI IN CLUSTERS IN BOTH AEROBIC AND ANAEROBIC BOTTLES CRITICAL RESULT CALLED TO, READ BACK BY AND VERIFIED WITH: MAYA LOUANN LABOR 1327 [id number] FCP Performed at [facility], [address] [place], [place] [address]    Culture STAPHYLOCOCCUS AUREUS (A)  Final   Report Status 11/01/2024  FINAL  Final   Organism ID, Bacteria STAPHYLOCOCCUS AUREUS  Final      Susceptibility   Staphylococcus aureus - MIC*    CIPROFLOXACIN <=0.5 SENSITIVE Sensitive     ERYTHROMYCIN >=8 RESISTANT Resistant     GENTAMICIN >=16 RESISTANT Resistant     OXACILLIN 0.5  SENSITIVE Sensitive     TETRACYCLINE <=1 SENSITIVE Sensitive     VANCOMYCIN  1 SENSITIVE Sensitive     TRIMETH/SULFA <=10 SENSITIVE Sensitive     CLINDAMYCIN <=0.25 SENSITIVE Sensitive     RIFAMPIN <=0.5 SENSITIVE Sensitive     Inducible Clindamycin NEGATIVE Sensitive     LINEZOLID 2 SENSITIVE Sensitive     * STAPHYLOCOCCUS AUREUS  Blood Culture ID Panel (Reflexed)     Status: Abnormal   Collection Time: 10/29/24  3:40 PM  Result Value Ref Range Status   Enterococcus faecalis NOT DETECTED NOT DETECTED Final   Enterococcus Faecium NOT DETECTED NOT DETECTED Final   Listeria monocytogenes NOT DETECTED NOT DETECTED Final   Staphylococcus species DETECTED (A) NOT DETECTED Final    Comment: CRITICAL RESULT CALLED TO, READ BACK BY AND VERIFIED WITH: PHARMD UTOMWEN, A 1327 [id number] FCP    Staphylococcus aureus (BCID) DETECTED (A) NOT DETECTED Final    Comment: CRITICAL RESULT CALLED TO, READ BACK BY AND VERIFIED WITH: PHARMD UTOMWEN, A 1327 [id number] FCP    Staphylococcus epidermidis NOT DETECTED NOT DETECTED Final   Staphylococcus lugdunensis NOT DETECTED NOT DETECTED Final   Streptococcus species NOT DETECTED NOT DETECTED Final   Streptococcus agalactiae NOT DETECTED NOT DETECTED Final   Streptococcus pneumoniae NOT DETECTED NOT DETECTED Final   Streptococcus pyogenes NOT DETECTED NOT DETECTED Final   A.calcoaceticus-baumannii NOT DETECTED NOT DETECTED Final   Bacteroides fragilis NOT DETECTED NOT DETECTED Final   Enterobacterales NOT DETECTED NOT DETECTED Final   Enterobacter cloacae complex NOT DETECTED NOT DETECTED Final   Escherichia coli NOT DETECTED NOT DETECTED Final   Klebsiella aerogenes NOT DETECTED NOT DETECTED Final   Klebsiella oxytoca NOT DETECTED NOT DETECTED Final   Klebsiella pneumoniae NOT DETECTED NOT DETECTED Final   Proteus species NOT DETECTED NOT DETECTED Final   Salmonella species NOT DETECTED NOT DETECTED Final   Serratia marcescens NOT DETECTED NOT DETECTED Final    Haemophilus influenzae NOT DETECTED NOT DETECTED Final   Neisseria meningitidis NOT DETECTED NOT DETECTED Final   Pseudomonas aeruginosa NOT DETECTED NOT DETECTED Final   Stenotrophomonas maltophilia NOT DETECTED NOT DETECTED Final   Candida albicans NOT DETECTED NOT DETECTED Final   Candida auris NOT DETECTED NOT DETECTED Final   Candida glabrata NOT DETECTED NOT DETECTED Final   Candida krusei NOT DETECTED NOT DETECTED Final   Candida parapsilosis NOT DETECTED NOT DETECTED Final   Candida tropicalis NOT DETECTED NOT DETECTED Final   Cryptococcus neoformans/gattii NOT DETECTED NOT DETECTED Final   Meth resistant mecA/C and MREJ NOT DETECTED NOT DETECTED Final    Comment: Performed at [facility], [address] [place], [place] [address]  MRSA Next Gen by PCR, Nasal     Status: None   Collection Time: 10/29/24 11:12 PM   Specimen: Nasal Mucosa; Nasal Swab  Result Value Ref Range Status   MRSA by PCR Next Gen NOT DETECTED NOT DETECTED Final    Comment: (NOTE) The GeneXpert MRSA Assay (FDA approved for NASAL specimens only), is one component of a comprehensive MRSA colonization surveillance program. It is not intended to diagnose MRSA infection nor to guide or monitor treatment for MRSA  infections. Test performance is not FDA approved in patients less than 12 years old. Performed at [facility], [address], [place], [place] [address]   Urine Culture     Status: Abnormal   Collection Time: 10/30/24  4:00 AM   Specimen: Urine, Random  Result Value Ref Range Status   Specimen Description   Final    URINE, RANDOM Performed at [facility], [address] [place], [place] [address]    Special Requests   Final    NONE Reflexed from [id number] Performed at [facility], [address], [place], [place] [address]    Culture (A)  Final    <10,000 COLONIES/mL INSIGNIFICANT GROWTH Performed at [facility],  [address], [place], [place] [address]    Report Status 10/31/2024 FINAL  Final  C Difficile Quick Screen w PCR reflex     Status: None   Collection Time: 10/30/24  4:44 PM   Specimen: STOOL  Result Value Ref Range Status   C Diff antigen NEGATIVE NEGATIVE Final   C Diff toxin NEGATIVE NEGATIVE Final   C Diff interpretation No C. difficile detected.  Final    Comment: Performed at [facility], [address], [place], [place] [address]  Gastrointestinal Panel by PCR , Stool     Status: None   Collection Time: 10/30/24  4:45 PM   Specimen: STOOL  Result Value Ref Range Status   Campylobacter species NOT DETECTED NOT DETECTED Final   Plesimonas shigelloides NOT DETECTED NOT DETECTED Final   Salmonella species NOT DETECTED NOT DETECTED Final   Yersinia enterocolitica NOT DETECTED NOT DETECTED Final   Vibrio species NOT DETECTED NOT DETECTED Final   Vibrio cholerae NOT DETECTED NOT DETECTED Final   Enteroaggregative E coli (EAEC) NOT DETECTED NOT DETECTED Final   Enteropathogenic E coli (EPEC) NOT DETECTED NOT DETECTED Final   Enterotoxigenic E coli (ETEC) NOT DETECTED NOT DETECTED Final   Shiga like toxin producing E coli (STEC) NOT DETECTED NOT DETECTED Final   Shigella/Enteroinvasive E coli (EIEC) NOT DETECTED NOT DETECTED Final   Cryptosporidium NOT DETECTED NOT DETECTED Final   Cyclospora cayetanensis NOT DETECTED NOT DETECTED Final   Entamoeba histolytica NOT DETECTED NOT DETECTED Final   Giardia lamblia NOT DETECTED NOT DETECTED Final   Adenovirus F40/41 NOT DETECTED NOT DETECTED Final   Astrovirus NOT DETECTED NOT DETECTED Final   Norovirus GI/GII NOT DETECTED NOT DETECTED Final   Rotavirus A NOT DETECTED NOT DETECTED Final   Sapovirus (I, II, IV, and V) NOT DETECTED NOT DETECTED Final    Comment: Performed at [facility], [address] [place], [place] [address]  Culture, blood (Routine X 2) w Reflex to ID Panel     Status: None   Collection  Time: 10/31/24  3:11 AM   Specimen: BLOOD  Result Value Ref Range Status   Specimen Description   Final    BLOOD SITE NOT SPECIFIED Performed at [facility], [address], [place], [place] [address]    Special Requests   Final    BOTTLES DRAWN AEROBIC ONLY Blood Culture results may not be optimal due to an inadequate volume of blood received in culture bottles Performed at [facility], [address] [place], [place] [address]    Culture   Final    NO GROWTH 5 DAYS Performed at [facility], [address], [place], [place] [address]  Report Status 11/05/2024 FINAL  Final  Culture, blood (Routine X 2) w Reflex to ID Panel     Status: None   Collection Time: 10/31/24  3:11 AM   Specimen: BLOOD LEFT HAND  Result Value Ref Range Status   Specimen Description   Final    BLOOD LEFT HAND Performed at [facility], [address], [place], [place] [address]    Special Requests   Final    BOTTLES DRAWN AEROBIC ONLY Blood Culture results may not be optimal due to an inadequate volume of blood received in culture bottles Performed at [facility], [address], [place], [place] [address]    Culture   Final    NO GROWTH 5 DAYS Performed at [facility], [address] [place], [place] [address]    Report Status 11/05/2024 FINAL  Final    Radiology Studies: No results found.    Ramey Ketcherside T. Jerrod Damiano [facility]  If 7PM-7AM, please contact night-coverage [email or web address] 11/06/2024, 12:36 PM   "

## 2024-11-06 NOTE — Progress Notes (Signed)
 Peripherally Inserted Central Catheter Placement  The IV Nurse has discussed with the patient and/or persons authorized to consent for the patient, the purpose of this procedure and the potential benefits and risks involved with this procedure.  The benefits include less needle sticks, lab draws from the catheter, and the patient may be discharged home with the catheter. Risks include, but not limited to, infection, bleeding, blood clot (thrombus formation), and puncture of an artery; nerve damage and irregular heartbeat and possibility to perform a PICC exchange if needed/ordered by physician.  Alternatives to this procedure were also discussed.  Bard Power PICC patient education guide, fact sheet on infection prevention and patient information card has been provided to patient /or left at bedside.    PICC Placement Documentation  PICC Single Lumen 11/06/24 Right Basilic 37 cm 0 cm (Active)  Indication for Insertion or Continuance of Line Home intravenous therapies (PICC only) 11/06/24 1500  Exposed Catheter (cm) 0 cm 11/06/24 1500  Site Assessment Clean, Dry, Intact 11/06/24 1500  Line Status Saline locked;Flushed;Blood return noted 11/06/24 1500  Dressing Type Transparent;Securing device 11/06/24 1500  Dressing Status Antimicrobial disc/dressing in place;Clean, Dry, Intact 11/06/24 1500  Line Care Connections checked and tightened 11/06/24 1500  Line Adjustment (NICU/IV Team Only) No 11/06/24 1500  Dressing Intervention New dressing 11/06/24 1500  Dressing Change Due 11/13/24 11/06/24 1500    Consent signed by daughter at bedside.   Matthias Merle Chenice 11/06/2024, 3:14 PM

## 2024-11-07 ENCOUNTER — Inpatient Hospital Stay (HOSPITAL_COMMUNITY)

## 2024-11-07 DIAGNOSIS — I959 Hypotension, unspecified: Secondary | ICD-10-CM | POA: Diagnosis not present

## 2024-11-07 DIAGNOSIS — T68XXXD Hypothermia, subsequent encounter: Secondary | ICD-10-CM | POA: Diagnosis not present

## 2024-11-07 DIAGNOSIS — R7881 Bacteremia: Secondary | ICD-10-CM | POA: Diagnosis not present

## 2024-11-07 DIAGNOSIS — I890 Lymphedema, not elsewhere classified: Secondary | ICD-10-CM | POA: Diagnosis not present

## 2024-11-07 LAB — GLUCOSE, CAPILLARY
Glucose-Capillary: 123 mg/dL — ABNORMAL HIGH (ref 70–99)
Glucose-Capillary: 126 mg/dL — ABNORMAL HIGH (ref 70–99)
Glucose-Capillary: 133 mg/dL — ABNORMAL HIGH (ref 70–99)
Glucose-Capillary: 161 mg/dL — ABNORMAL HIGH (ref 70–99)
Glucose-Capillary: 86 mg/dL (ref 70–99)
Glucose-Capillary: 91 mg/dL (ref 70–99)

## 2024-11-07 LAB — CBC
HCT: 20.2 % — ABNORMAL LOW (ref 36.0–46.0)
Hemoglobin: 6.2 g/dL — CL (ref 12.0–15.0)
MCH: 27 pg (ref 26.0–34.0)
MCHC: 30.7 g/dL (ref 30.0–36.0)
MCV: 87.8 fL (ref 80.0–100.0)
Platelets: 171 K/uL (ref 150–400)
RBC: 2.3 MIL/uL — ABNORMAL LOW (ref 3.87–5.11)
RDW: 20.4 % — ABNORMAL HIGH (ref 11.5–15.5)
WBC: 24.2 K/uL — ABNORMAL HIGH (ref 4.0–10.5)
nRBC: 0.2 % (ref 0.0–0.2)

## 2024-11-07 LAB — RENAL FUNCTION PANEL
Albumin: 2.3 g/dL — ABNORMAL LOW (ref 3.5–5.0)
Anion gap: 12 (ref 5–15)
BUN: 63 mg/dL — ABNORMAL HIGH (ref 8–23)
CO2: 20 mmol/L — ABNORMAL LOW (ref 22–32)
Calcium: 8.3 mg/dL — ABNORMAL LOW (ref 8.9–10.3)
Chloride: 115 mmol/L — ABNORMAL HIGH (ref 98–111)
Creatinine, Ser: 2.53 mg/dL — ABNORMAL HIGH (ref 0.44–1.00)
GFR, Estimated: 18 mL/min — ABNORMAL LOW
Glucose, Bld: 101 mg/dL — ABNORMAL HIGH (ref 70–99)
Phosphorus: 4.4 mg/dL (ref 2.5–4.6)
Potassium: 4.2 mmol/L (ref 3.5–5.1)
Sodium: 146 mmol/L — ABNORMAL HIGH (ref 135–145)

## 2024-11-07 LAB — HEMOGLOBIN AND HEMATOCRIT, BLOOD
HCT: 24 % — ABNORMAL LOW (ref 36.0–46.0)
Hemoglobin: 7.5 g/dL — ABNORMAL LOW (ref 12.0–15.0)

## 2024-11-07 LAB — PRO BRAIN NATRIURETIC PEPTIDE: Pro Brain Natriuretic Peptide: 15919 pg/mL — ABNORMAL HIGH

## 2024-11-07 LAB — SARS CORONAVIRUS 2 BY RT PCR: SARS Coronavirus 2 by RT PCR: NEGATIVE

## 2024-11-07 LAB — MAGNESIUM: Magnesium: 2 mg/dL (ref 1.7–2.4)

## 2024-11-07 LAB — PREPARE RBC (CROSSMATCH)

## 2024-11-07 MED ORDER — PREDNISONE 5 MG PO TABS
10.0000 mg | ORAL_TABLET | Freq: Every day | ORAL | Status: DC
  Administered 2024-11-08: 10 mg via ORAL
  Filled 2024-11-07: qty 2

## 2024-11-07 MED ORDER — FUROSEMIDE 10 MG/ML IJ SOLN
20.0000 mg | Freq: Once | INTRAMUSCULAR | Status: AC
  Administered 2024-11-07: 20 mg via INTRAVENOUS
  Filled 2024-11-07: qty 2

## 2024-11-07 MED ORDER — MORPHINE SULFATE (PF) 2 MG/ML IV SOLN
1.0000 mg | INTRAVENOUS | Status: DC | PRN
  Administered 2024-11-07: 1 mg via INTRAVENOUS
  Filled 2024-11-07: qty 1

## 2024-11-07 MED ORDER — LORAZEPAM 0.5 MG PO TABS
0.5000 mg | ORAL_TABLET | Freq: Two times a day (BID) | ORAL | Status: DC | PRN
  Administered 2024-11-07 – 2024-11-08 (×2): 0.5 mg via ORAL
  Filled 2024-11-07 (×2): qty 1

## 2024-11-07 MED ORDER — SODIUM CHLORIDE 0.9% IV SOLUTION: Freq: Once | INTRAVENOUS | Status: AC

## 2024-11-07 MED ORDER — PANTOPRAZOLE SODIUM 40 MG PO TBEC
40.0000 mg | DELAYED_RELEASE_TABLET | Freq: Every day | ORAL | Status: DC
  Administered 2024-11-07 – 2024-11-08 (×2): 40 mg via ORAL
  Filled 2024-11-07 (×2): qty 1

## 2024-11-07 MED ORDER — HALOPERIDOL LACTATE 5 MG/ML IJ SOLN
2.0000 mg | Freq: Once | INTRAMUSCULAR | Status: AC | PRN
  Administered 2024-11-08: 2 mg via INTRAVENOUS
  Filled 2024-11-07: qty 1

## 2024-11-07 NOTE — TOC Progression Note (Addendum)
 Transition of Care [facility]) - Progression Note    Patient Details  Name: Jody Shaw MRN: [id number] Date of Birth: 06-Jan-1941  Transition of Care [facility]) CM/SW Contact  Nataki Mccrumb, Nathanel, RN Phone Number: 11/07/2024, 11:22 AM  Clinical Narrative: Noted recc-ST SNF w/Palliative care;will await family choice for ST SNF;also updated PT note since today-unable(lethargy) to do PT.   -Spoke to siblings-Joanne(defer to [place] who defers to [place] as the contact [phone]-elaine is the primary contact-she will call be back w/choice of facility prior starting auth-also informed of [place] area facilities-all are either not in networ;no beds;not appropriate, or still awaiting call back-family will chose from accepted facilities.  Service Provider Request Status Services Address Phone Fax Patient Preferred  [facility] Preferred SNF  Accepted -- [address] [place] [address] [phone] --  [facility] Preferred SNF  Accepted -- [address], [place] [address] [phone] --  [facility]  Accepted -- [address] [place] [place] [address] [phone] --  [facility]  Accepted -- [address] [place], [place] [address] [phone] --  [facility] Preferred SNF  Accepted -- [address] [place] [address] [phone]        Expected Discharge Plan: Skilled Nursing Facility Barriers to Discharge: Continued Medical Work up               Expected Discharge Plan and Services In-house Referral: Hospice / Palliative Care Discharge Planning Services: CM Consult Post Acute Care Choice: Durable Medical Equipment Living arrangements for the past 2 months: Single Family Home                 DME Arranged: N/A DME Agency: NA       HH Arranged: NA HH Agency: NA         Social Drivers of Health (SDOH) Interventions SDOH  Screenings   Food Insecurity: No Food Insecurity (11/01/2024)  Housing: Low Risk (11/01/2024)  Transportation Needs: No Transportation Needs (11/01/2024)  Utilities: Not At Risk (11/01/2024)  Financial Resource Strain: Low Risk (10/16/2023)   Received from [facility]  Social Connections: Socially Isolated (11/02/2024)  Tobacco Use: Medium Risk (11/04/2024)    Readmission Risk Interventions    11/03/2024   12:03 PM 10/31/2024    2:41 PM 03/25/2024    2:11 PM  Readmission Risk Prevention Plan  Transportation Screening Complete Complete Complete  PCP or Specialist Appt within 5-7 Days Complete Complete Complete  Home Care Screening Complete Complete Complete  Medication Review (RN CM) Complete Complete Complete

## 2024-11-07 NOTE — Progress Notes (Addendum)
 " PROGRESS NOTE  Jody Shaw FMW:[id number] DOB: [birth year]   PCP: Nichole Senior, MD  Patient is from: Home.  DOA: 10/29/2024 LOS: 9  Chief complaints Chief Complaint  Patient presents with   Weakness    Pt dx with PNA today @ PCP,  pt coming from PCP with c/o weakness- generalized, malaise, hallucination, abnormal lab with creatinine of 3. Pt reports illness x 1 week, a/o x4.       Brief Narrative / Interim history: 83 y/o F with giant cell arteritis, lymphedema, chronic leukocytosis, CAD presenting for weakness and evaluation of possible PNA, and found to have septic shock in the setting of MSSA bacteremia.  Noted to have Cr 2.76 (was 1.6 in 03/2024), WBC 13.3 and hypothermia. Rectal temps 91 and 93 degrees. Also noted to have hypotension with MAPs < 60 and requited pressors. Hospital course complicated by confusion. Eventually found to have MSSA bacteremia.  Repeat blood cultures NGTD.  TTE without vegetation.  Declined TEE.  ID recommended IV Ancef  for 6 weeks starting 10/31/2024.  On 12/24, hypotensive, lethargic with low temperatures.   Subjective: Seen and examined earlier this morning.  No major events overnight or this morning.  Not feeling well today.  She says she is having a panic attack and shortness of breath.  Hemoglobin down to 6.2 earlier this morning.  One unit of blood ordered.  Has not had a bowel movement yesterday and today.  Assessment and plan: Septic shock, MSSA bacteremia: Present on admission.  Hypothermia, hypotension, leukocytosis, lactic acidosis and AKI.  Repeat blood cultures NGTD.  TTE without vegetation.  Reportedly declined TEE.  Sepsis physiology resolved except for leukocytosis. -Continue IV Ancef  for 6 weeks starting from 12/19 per recommendation by ID. -Continue p.o. midodrine .  Decrease prednisone  to home dose. -Her leukocytosis is acute on chronic, could be leukemoid reaction. -Needs PICC line before discharge.    Left lung pneumonia:  Reports shortness of breath, anxiety/panic attack and feeling bad.  No desaturation but on 2 L.  CXR raises concern for new left midlung and left lower base infiltrate with small left pleural effusion -Check proBNP, COVID and RVP -Will run by ID about broadening antibiotics -Continue breathing treatments   Delirium: In the setting of the above.  Resolved.  Currently awake and alert and oriented appropriately. -Reorientation and delirium precaution -Minimize sedating medications   AKI on CKD-3B: Cr was 1.6 in 03/2024.  Might have progressed to CKD-4.  Stable. Recent Labs    10/29/24 1441 10/30/24 0546 10/31/24 0311 11/01/24 0250 11/02/24 0310 11/03/24 0242 11/04/24 0300 11/05/24 0304 11/06/24 0538 11/07/24 0350  BUN 61* 49* 48* 46* 49* 51* 55* 55* 53* 63*  CREATININE 2.76* 2.36* 2.43* 2.51* 2.78* 3.05* 2.79* 2.74* 2.58* 2.53*  - Continue monitoring - Manage hypertension as above - Previous attending discussed with patient's son who does not want her to have dialysis if renal function continues to decline.  Normocytic anemia: H&H relatively stable.  Denies melena or hematochezia.  Anemia panel without nutritional deficiency. Recent Labs    10/30/24 0709 10/30/24 1525 10/31/24 0311 11/01/24 0250 11/02/24 0310 11/03/24 0242 11/04/24 0300 11/05/24 0304 11/06/24 0538 11/07/24 0350  HGB 6.4* 9.0* 9.4* 9.0* 8.7* 7.4* 7.9* 8.0* 7.2* 6.2*  - Transfuse 1 units - Check posttransfusion hemoglobin - Discontinue aspirin  and subcu heparin .  Hypotension/lethargy: Improved after p.o. midodrine  and increasing p.o. prednisone . - Continue p.o. midodrine     Chronic acquired lymphedema - cont ACE wraps of legs  History of  giant cell arteritis -Continue home prednisone .   Anxiety and depression: Reports feeling anxious.  - Continue Zoloft    Generalized weakness  -Needing a Hoyer lift- will be going to SNF as family is not able to care for her in this situations  Leukocytosis:  Acute on chronic.  Leukemoid reaction? - Antibiotics as above - Continue monitoring  Class II obesity Body mass index is 37.3 kg/m.         Pressure skin injury: Present on admission. Wound 10/30/24 Pressure Injury Buttocks Left Stage 3 -  Full thickness tissue loss. Subcutaneous fat may be visible but bone, tendon or muscle are NOT exposed. (Active)     Wound 10/30/24 Pressure Injury Foot Left;Posterior Deep Tissue Pressure Injury - Purple or maroon localized area of discolored intact skin or blood-filled blister due to damage of underlying soft tissue from pressure and/or shear. (Active)   DVT prophylaxis:    Code Status: DNR Family Communication: Updated patient's son at bedside Level of care: Progressive Status is: Inpatient Remains inpatient appropriate because: Hypotension, MSSA bacteremia, anemia and lethargy   Final disposition: SNF   55 minutes with more than 50% spent in reviewing records, counseling patient/family and coordinating care.  Consultants:  Infectious disease Critical care Palliative care  Procedures: None  Microbiology summarized: 12/17-COVID-19, influenza and RSV PCR nonreactive 12/17-20 pathogen RVP nonreactive 12/17-MRSA PCR screen nonreactive 12/17-blood culture with MSSA 12/18-urine culture with insignificant growth 12/18-C. difficile negative 12/19-blood cultures NGTD  Objective: Vitals:   11/07/24 0601 11/07/24 0843 11/07/24 1100 11/07/24 1135  BP: (!) 153/73  (!) 133/55 (P) 109/83  Pulse: 72  72 (P) 80  Resp: 16  20 (!) (P) 22  Temp: 99.1 F (37.3 C)  97.7 F (36.5 C) (!) (P) 97.3 F (36.3 C)  TempSrc:   Oral (P) Oral  SpO2: 99% 98% 100% (P) 100%  Weight:      Height:        Examination:  GENERAL: No apparent distress.  Nontoxic. HEENT: MMM.  Vision and hearing grossly intact.  NECK: Supple.  No apparent JVD.  RESP:  No IWOB.  Fair aeration bilaterally. CVS:  RRR. Heart sounds normal.  ABD/GI/GU: BS+. Abd soft,  NTND.  MSK/EXT: Ace wrap over BLE. NEURO: Awake and alert and oriented x 4.  Follows commands. PSYCH: Calm. Normal affect.   Sch Meds:  Scheduled Meds:  albuterol   2.5 mg Nebulization BID   Chlorhexidine  Gluconate Cloth  6 each Topical Daily   clotrimazole    Topical BID   famotidine   20 mg Oral Daily   fiber  1 packet Oral BID   levothyroxine   88 mcg Oral Q0600   melatonin  3 mg Oral QHS   midodrine   5 mg Oral TID WC   nystatin   5 mL Oral QID   pantoprazole   40 mg Oral Daily   predniSONE   20 mg Oral Q breakfast   rosuvastatin   10 mg Oral Once per day on Sunday Wednesday   sertraline   25 mg Oral Daily   sodium chloride  flush  10-40 mL Intracatheter Q12H   umeclidinium-vilanterol  1 puff Inhalation Daily   Continuous Infusions:   ceFAZolin  (ANCEF ) IV 2 g (11/07/24 0956)   PRN Meds:.acetaminophen  **OR** acetaminophen , albuterol , artificial tears, bisacodyl , guaiFENesin , loperamide , ondansetron  **OR** ondansetron  (ZOFRAN ) IV, mouth rinse, senna-docusate, sodium chloride  flush  Antimicrobials: Anti-infectives (From admission, onward)    Start     Dose/Rate Route Frequency Ordered Stop   11/04/24 1000  ceFAZolin  (ANCEF ) IVPB 2g/100  mL premix  Status:  Discontinued        2 g 200 mL/hr over 30 Minutes Intravenous Every 24 hours 11/03/24 1033 11/03/24 1323   11/03/24 2200  ceFAZolin  (ANCEF ) IVPB 2g/100 mL premix        2 g 200 mL/hr over 30 Minutes Intravenous Every 12 hours 11/03/24 1323     10/30/24 1600  azithromycin  (ZITHROMAX ) 500 mg in sodium chloride  0.9 % 250 mL IVPB  Status:  Discontinued        500 mg 250 mL/hr over 60 Minutes Intravenous Every 24 hours 10/30/24 1225 10/30/24 1347   10/30/24 1400  cefTRIAXone  (ROCEPHIN ) 2 g in sodium chloride  0.9 % 100 mL IVPB  Status:  Discontinued        2 g 200 mL/hr over 30 Minutes Intravenous Every 24 hours 10/30/24 1225 10/30/24 1347   10/30/24 1400  ceFAZolin  (ANCEF ) IVPB 2g/100 mL premix  Status:  Discontinued        2 g 200  mL/hr over 30 Minutes Intravenous Every 8 hours 10/30/24 1347 10/30/24 1352   10/30/24 1400  ceFAZolin  (ANCEF ) IVPB 2g/100 mL premix  Status:  Discontinued        2 g 200 mL/hr over 30 Minutes Intravenous Every 12 hours 10/30/24 1352 11/03/24 1033   10/29/24 1445  cefTRIAXone  (ROCEPHIN ) 2 g in sodium chloride  0.9 % 100 mL IVPB        2 g 200 mL/hr over 30 Minutes Intravenous Once 10/29/24 1430 10/29/24 1525   10/29/24 1445  azithromycin  (ZITHROMAX ) 500 mg in sodium chloride  0.9 % 250 mL IVPB        500 mg 250 mL/hr over 60 Minutes Intravenous  Once 10/29/24 1430 10/29/24 1629        I have personally reviewed the following labs and images: CBC: Recent Labs  Lab 11/02/24 0310 11/03/24 0242 11/04/24 0300 11/05/24 0304 11/06/24 0538 11/07/24 0350  WBC 17.4* 16.2* 15.4* 23.4* 24.3* 24.2*  NEUTROABS 16.0* 14.5* 13.6* 19.9* 20.6*  --   HGB 8.7* 7.4* 7.9* 8.0* 7.2* 6.2*  HCT 27.9* 23.5* 25.3* 25.5* 23.6* 20.2*  MCV 85.6 84.8 87.2 86.1 87.4 87.8  PLT 206 182 168 162 198 171   BMP &GFR Recent Labs  Lab 11/03/24 0242 11/04/24 0300 11/05/24 0304 11/06/24 0538 11/07/24 0350  NA 143 141 142 144 146*  K 4.0 4.8 4.2 4.6 4.2  CL 112* 112* 110 112* 115*  CO2 17* 16* 16* 17* 20*  GLUCOSE 100* 104* 98 110* 101*  BUN 51* 55* 55* 53* 63*  CREATININE 3.05* 2.79* 2.74* 2.58* 2.53*  CALCIUM  8.0* 8.0* 8.2* 8.6* 8.3*  MG 2.3 2.2 2.0 2.2 2.0  PHOS 4.7* 5.0* 4.8* 4.9* 4.4   Estimated Creatinine Clearance: 17.2 mL/min (A) (by C-G formula based on SCr of 2.53 mg/dL (H)). Liver & Pancreas: Recent Labs  Lab 11/06/24 0538 11/07/24 0350  AST 11*  --   ALT 8  --   ALKPHOS 109  --   BILITOT <0.2  --   PROT 5.6*  --   ALBUMIN  2.4* 2.3*   No results for input(s): LIPASE, AMYLASE in the last 168 hours. No results for input(s): AMMONIA in the last 168 hours. Diabetic: No results for input(s): HGBA1C in the last 72 hours. Recent Labs  Lab 11/06/24 2015 11/07/24 0021  11/07/24 0426 11/07/24 0810 11/07/24 1143  GLUCAP 144* 126* 91 86 123*   Cardiac Enzymes: No results for input(s): CKTOTAL, CKMB, CKMBINDEX, TROPONINI in  the last 168 hours. No results for input(s): PROBNP in the last 8760 hours. Coagulation Profile: No results for input(s): INR, PROTIME in the last 168 hours.  Thyroid Function Tests: No results for input(s): TSH, T4TOTAL, FREET4, T3FREE, THYROIDAB in the last 72 hours. Lipid Profile: No results for input(s): CHOL, HDL, LDLCALC, TRIG, CHOLHDL, LDLDIRECT in the last 72 hours. Anemia Panel: Recent Labs    11/06/24 1342  VITAMINB12 1,203*  FOLATE 5.3*  FERRITIN 243  TIBC 138*  IRON 29  RETICCTPCT 0.7   Urine analysis:    Component Value Date/Time   COLORURINE YELLOW 10/30/2024 0400   APPEARANCEUR HAZY (A) 10/30/2024 0400   LABSPEC 1.012 10/30/2024 0400   PHURINE 5.0 10/30/2024 0400   GLUCOSEU NEGATIVE 10/30/2024 0400   HGBUR NEGATIVE 10/30/2024 0400   BILIRUBINUR NEGATIVE 10/30/2024 0400   KETONESUR NEGATIVE 10/30/2024 0400   PROTEINUR NEGATIVE 10/30/2024 0400   NITRITE NEGATIVE 10/30/2024 0400   LEUKOCYTESUR SMALL (A) 10/30/2024 0400   Sepsis Labs: Invalid input(s): PROCALCITONIN, LACTICIDVEN  Microbiology: Recent Results (from the past 240 hours)  Resp panel by RT-PCR (RSV, Flu A&B, Covid)     Status: None   Collection Time: 10/29/24  2:31 PM   Specimen: Nasal Swab  Result Value Ref Range Status   SARS Coronavirus 2 by RT PCR NEGATIVE NEGATIVE Final    Comment: (NOTE) SARS-CoV-2 target nucleic acids are NOT DETECTED.  The SARS-CoV-2 RNA is generally detectable in upper respiratory specimens during the acute phase of infection. The lowest concentration of SARS-CoV-2 viral copies this assay can detect is 138 copies/mL. A negative result does not preclude SARS-Cov-2 infection and should not be used as the sole basis for treatment or other patient management decisions. A  negative result may occur with  improper specimen collection/handling, submission of specimen other than nasopharyngeal swab, presence of viral mutation(s) within the areas targeted by this assay, and inadequate number of viral copies(<138 copies/mL). A negative result must be combined with clinical observations, patient history, and epidemiological information. The expected result is Negative.  Fact Sheet for Patients:  bloggercourse.com  Fact Sheet for Healthcare Providers:  seriousbroker.it  This test is no t yet approved or cleared by the [place]  FDA and  has been authorized for detection and/or diagnosis of SARS-CoV-2 by FDA under an Emergency Use Authorization (EUA). This EUA will remain  in effect (meaning this test can be used) for the duration of the COVID-19 declaration under Section 564(b)(1) of the Act, 21 U.S.C.section 360bbb-3(b)(1), unless the authorization is terminated  or revoked sooner.       Influenza A by PCR NEGATIVE NEGATIVE Final   Influenza B by PCR NEGATIVE NEGATIVE Final    Comment: (NOTE) The Xpert Xpress SARS-CoV-2/FLU/RSV plus assay is intended as an aid in the diagnosis of influenza from Nasopharyngeal swab specimens and should not be used as a sole basis for treatment. Nasal washings and aspirates are unacceptable for Xpert Xpress SARS-CoV-2/FLU/RSV testing.  Fact Sheet for Patients: bloggercourse.com  Fact Sheet for Healthcare Providers: seriousbroker.it  This test is not yet approved or cleared by the [place]  FDA and has been authorized for detection and/or diagnosis of SARS-CoV-2 by FDA under an Emergency Use Authorization (EUA). This EUA will remain in effect (meaning this test can be used) for the duration of the COVID-19 declaration under Section 564(b)(1) of the Act, 21 U.S.C. section 360bbb-3(b)(1), unless the authorization  is terminated or revoked.     Resp Syncytial Virus by PCR NEGATIVE NEGATIVE  Final    Comment: (NOTE) Fact Sheet for Patients: bloggercourse.com  Fact Sheet for Healthcare Providers: seriousbroker.it  This test is not yet approved or cleared by the [place]  FDA and has been authorized for detection and/or diagnosis of SARS-CoV-2 by FDA under an Emergency Use Authorization (EUA). This EUA will remain in effect (meaning this test can be used) for the duration of the COVID-19 declaration under Section 564(b)(1) of the Act, 21 U.S.C. section 360bbb-3(b)(1), unless the authorization is terminated or revoked.  Performed at [facility], [address] [place], [place] [address]   Respiratory (~20 pathogens) panel by PCR     Status: None   Collection Time: 10/29/24  2:31 PM   Specimen: Nasopharyngeal Swab; Respiratory  Result Value Ref Range Status   Adenovirus NOT DETECTED NOT DETECTED Final   Coronavirus 229E NOT DETECTED NOT DETECTED Final    Comment: (NOTE) The Coronavirus on the Respiratory Panel, DOES NOT test for the novel  Coronavirus (2019 nCoV)    Coronavirus HKU1 NOT DETECTED NOT DETECTED Final   Coronavirus NL63 NOT DETECTED NOT DETECTED Final   Coronavirus OC43 NOT DETECTED NOT DETECTED Final   Metapneumovirus NOT DETECTED NOT DETECTED Final   Rhinovirus / Enterovirus NOT DETECTED NOT DETECTED Final   Influenza A NOT DETECTED NOT DETECTED Final   Influenza B NOT DETECTED NOT DETECTED Final   Parainfluenza Virus 1 NOT DETECTED NOT DETECTED Final   Parainfluenza Virus 2 NOT DETECTED NOT DETECTED Final   Parainfluenza Virus 3 NOT DETECTED NOT DETECTED Final   Parainfluenza Virus 4 NOT DETECTED NOT DETECTED Final   Respiratory Syncytial Virus NOT DETECTED NOT DETECTED Final   Bordetella pertussis NOT DETECTED NOT DETECTED Final   Bordetella Parapertussis NOT DETECTED NOT DETECTED Final    Chlamydophila pneumoniae NOT DETECTED NOT DETECTED Final   Mycoplasma pneumoniae NOT DETECTED NOT DETECTED Final    Comment: Performed at [facility], [address] [place], [place] [address]  Blood Culture (routine x 2)     Status: Abnormal   Collection Time: 10/29/24  3:25 PM   Specimen: BLOOD  Result Value Ref Range Status   Specimen Description   Final    BLOOD SITE NOT SPECIFIED Performed at [facility], [address] [place], [place] [address]    Special Requests   Final    BOTTLES DRAWN AEROBIC AND ANAEROBIC Blood Culture results may not be optimal due to an inadequate volume of blood received in culture bottles Performed at [facility], [address] [place], [place] [address]    Culture  Setup Time   Final    GRAM POSITIVE COCCI IN CLUSTERS IN BOTH AEROBIC AND ANAEROBIC BOTTLES CRITICAL VALUE NOTED.  VALUE IS CONSISTENT WITH PREVIOUSLY REPORTED AND CALLED VALUE.    Culture (A)  Final    STAPHYLOCOCCUS AUREUS SUSCEPTIBILITIES PERFORMED ON PREVIOUS CULTURE WITHIN THE LAST 5 DAYS. Performed at [facility], [address] [place], [place] [address]    Report Status 11/01/2024 FINAL  Final  Blood Culture (routine x 2)     Status: Abnormal   Collection Time: 10/29/24  3:40 PM   Specimen: BLOOD  Result Value Ref Range Status   Specimen Description   Final    BLOOD SITE NOT SPECIFIED Performed at [facility], [address] [place], [place] [address]    Special Requests   Final    BOTTLES DRAWN AEROBIC AND ANAEROBIC Blood Culture  results may not be optimal due to an inadequate volume of blood received in culture bottles Performed at [facility], [address] [place], [place] [address]    Culture  Setup Time   Final    GRAM POSITIVE COCCI IN CLUSTERS IN BOTH AEROBIC AND ANAEROBIC BOTTLES CRITICAL RESULT CALLED TO, READ BACK BY AND VERIFIED WITH: MAYA LOUANN LABOR 1327  [id number] FCP Performed at [facility], [address] [place], [place] [address]    Culture STAPHYLOCOCCUS AUREUS (A)  Final   Report Status 11/01/2024 FINAL  Final   Organism ID, Bacteria STAPHYLOCOCCUS AUREUS  Final      Susceptibility   Staphylococcus aureus - MIC*    CIPROFLOXACIN <=0.5 SENSITIVE Sensitive     ERYTHROMYCIN >=8 RESISTANT Resistant     GENTAMICIN >=16 RESISTANT Resistant     OXACILLIN 0.5 SENSITIVE Sensitive     TETRACYCLINE <=1 SENSITIVE Sensitive     VANCOMYCIN  1 SENSITIVE Sensitive     TRIMETH/SULFA <=10 SENSITIVE Sensitive     CLINDAMYCIN <=0.25 SENSITIVE Sensitive     RIFAMPIN <=0.5 SENSITIVE Sensitive     Inducible Clindamycin NEGATIVE Sensitive     LINEZOLID 2 SENSITIVE Sensitive     * STAPHYLOCOCCUS AUREUS  Blood Culture ID Panel (Reflexed)     Status: Abnormal   Collection Time: 10/29/24  3:40 PM  Result Value Ref Range Status   Enterococcus faecalis NOT DETECTED NOT DETECTED Final   Enterococcus Faecium NOT DETECTED NOT DETECTED Final   Listeria monocytogenes NOT DETECTED NOT DETECTED Final   Staphylococcus species DETECTED (A) NOT DETECTED Final    Comment: CRITICAL RESULT CALLED TO, READ BACK BY AND VERIFIED WITH: PHARMD UTOMWEN, A 1327 [id number] FCP    Staphylococcus aureus (BCID) DETECTED (A) NOT DETECTED Final    Comment: CRITICAL RESULT CALLED TO, READ BACK BY AND VERIFIED WITH: PHARMD UTOMWEN, A 1327 [id number] FCP    Staphylococcus epidermidis NOT DETECTED NOT DETECTED Final   Staphylococcus lugdunensis NOT DETECTED NOT DETECTED Final   Streptococcus species NOT DETECTED NOT DETECTED Final   Streptococcus agalactiae NOT DETECTED NOT DETECTED Final   Streptococcus pneumoniae NOT DETECTED NOT DETECTED Final   Streptococcus pyogenes NOT DETECTED NOT DETECTED Final   A.calcoaceticus-baumannii NOT DETECTED NOT DETECTED Final   Bacteroides fragilis NOT DETECTED NOT DETECTED Final   Enterobacterales NOT DETECTED NOT DETECTED Final   Enterobacter  cloacae complex NOT DETECTED NOT DETECTED Final   Escherichia coli NOT DETECTED NOT DETECTED Final   Klebsiella aerogenes NOT DETECTED NOT DETECTED Final   Klebsiella oxytoca NOT DETECTED NOT DETECTED Final   Klebsiella pneumoniae NOT DETECTED NOT DETECTED Final   Proteus species NOT DETECTED NOT DETECTED Final   Salmonella species NOT DETECTED NOT DETECTED Final   Serratia marcescens NOT DETECTED NOT DETECTED Final   Haemophilus influenzae NOT DETECTED NOT DETECTED Final   Neisseria meningitidis NOT DETECTED NOT DETECTED Final   Pseudomonas aeruginosa NOT DETECTED NOT DETECTED Final   Stenotrophomonas maltophilia NOT DETECTED NOT DETECTED Final   Candida albicans NOT DETECTED NOT DETECTED Final   Candida auris NOT DETECTED NOT DETECTED Final   Candida glabrata NOT DETECTED NOT DETECTED Final   Candida krusei NOT DETECTED NOT DETECTED Final   Candida parapsilosis NOT DETECTED NOT DETECTED Final   Candida tropicalis NOT DETECTED NOT DETECTED Final   Cryptococcus neoformans/gattii NOT DETECTED NOT DETECTED Final   Meth resistant mecA/C and MREJ NOT DETECTED NOT DETECTED Final    Comment: Performed at [facility], [address] [place], [place] [address]  MRSA Next Gen by PCR, Nasal     Status: None   Collection Time: 10/29/24 11:12 PM   Specimen: Nasal Mucosa; Nasal Swab  Result Value Ref Range Status   MRSA by PCR Next Gen NOT DETECTED NOT DETECTED Final    Comment: (NOTE) The GeneXpert MRSA Assay (FDA approved for NASAL specimens only), is one component of a comprehensive MRSA colonization surveillance program. It is not intended to diagnose MRSA infection nor to guide or monitor treatment for MRSA infections. Test performance is not FDA approved in patients less than 53 years old. Performed at [facility], [address] [place], [place] [address]   Urine Culture     Status: Abnormal   Collection Time: 10/30/24  4:00 AM   Specimen: Urine, Random   Result Value Ref Range Status   Specimen Description   Final    URINE, RANDOM Performed at [facility], [address] [place], [place] [address]    Special Requests   Final    NONE Reflexed from [id number] Performed at [facility], [address], [place], [place] [address]    Culture (A)  Final    <10,000 COLONIES/mL INSIGNIFICANT GROWTH Performed at [facility], [address] [place], [place] [address]    Report Status 10/31/2024 FINAL  Final  C Difficile Quick Screen w PCR reflex     Status: None   Collection Time: 10/30/24  4:44 PM   Specimen: STOOL  Result Value Ref Range Status   C Diff antigen NEGATIVE NEGATIVE Final   C Diff toxin NEGATIVE NEGATIVE Final   C Diff interpretation No C. difficile detected.  Final    Comment: Performed at [facility], [address], [place], [place] [address]  Gastrointestinal Panel by PCR , Stool     Status: None   Collection Time: 10/30/24  4:45 PM   Specimen: STOOL  Result Value Ref Range Status   Campylobacter species NOT DETECTED NOT DETECTED Final   Plesimonas shigelloides NOT DETECTED NOT DETECTED Final   Salmonella species NOT DETECTED NOT DETECTED Final   Yersinia enterocolitica NOT DETECTED NOT DETECTED Final   Vibrio species NOT DETECTED NOT DETECTED Final   Vibrio cholerae NOT DETECTED NOT DETECTED Final   Enteroaggregative E coli (EAEC) NOT DETECTED NOT DETECTED Final   Enteropathogenic E coli (EPEC) NOT DETECTED NOT DETECTED Final   Enterotoxigenic E coli (ETEC) NOT DETECTED NOT DETECTED Final   Shiga like toxin producing E coli (STEC) NOT DETECTED NOT DETECTED Final   Shigella/Enteroinvasive E coli (EIEC) NOT DETECTED NOT DETECTED Final   Cryptosporidium NOT DETECTED NOT DETECTED Final   Cyclospora cayetanensis NOT DETECTED NOT DETECTED Final   Entamoeba histolytica NOT DETECTED NOT DETECTED Final   Giardia lamblia NOT DETECTED NOT DETECTED Final    Adenovirus F40/41 NOT DETECTED NOT DETECTED Final   Astrovirus NOT DETECTED NOT DETECTED Final   Norovirus GI/GII NOT DETECTED NOT DETECTED Final   Rotavirus A NOT DETECTED NOT DETECTED Final   Sapovirus (I, II, IV, and V) NOT DETECTED NOT DETECTED Final    Comment: Performed at [facility], [address] [place], [place] [address]  Culture, blood (Routine X 2) w Reflex to ID Panel     Status: None   Collection Time: 10/31/24  3:11 AM   Specimen: BLOOD  Result Value Ref Range Status   Specimen Description  Final    BLOOD SITE NOT SPECIFIED Performed at [facility], [address] [place], [place] [address]    Special Requests   Final    BOTTLES DRAWN AEROBIC ONLY Blood Culture results may not be optimal due to an inadequate volume of blood received in culture bottles Performed at [facility], [address] [place], [place] [address]    Culture   Final    NO GROWTH 5 DAYS Performed at [facility], [address], [place], [place] [address]    Report Status 11/05/2024 FINAL  Final  Culture, blood (Routine X 2) w Reflex to ID Panel     Status: None   Collection Time: 10/31/24  3:11 AM   Specimen: BLOOD LEFT HAND  Result Value Ref Range Status   Specimen Description   Final    BLOOD LEFT HAND Performed at [facility], [address], [place], [place] [address]    Special Requests   Final    BOTTLES DRAWN AEROBIC ONLY Blood Culture results may not be optimal due to an inadequate volume of blood received in culture bottles Performed at [facility], [address], [place], [place] [address]    Culture   Final    NO GROWTH 5 DAYS Performed at [facility], [address], [place], [place] [address]    Report Status 11/05/2024 FINAL  Final    Radiology Studies: DG Chest Port 1 View Result Date: 11/07/2024 CLINICAL DATA:  Shortness of breath. EXAM: PORTABLE CHEST 1 VIEW COMPARISON:  10/29/2024  FINDINGS: Cardiopericardial silhouette is at upper limits of normal for size. New airspace disease in the left mid lung and left lung base is suspicious for pneumonia. Small left pleural effusion evident. Right lung relatively clear. Right PICC line tip overlies the lower SVC level. IMPRESSION: 1. New airspace disease in the left mid lung and left lung base suspicious for pneumonia. 2. Small left pleural effusion. Electronically Signed   By: Camellia Candle M.D.   On: 11/07/2024 11:14      Merrillyn Ackerley T. Azeneth Carbonell [facility]  If 7PM-7AM, please contact night-coverage [email or web address] 11/07/2024, 12:13 PM   "

## 2024-11-07 NOTE — Progress Notes (Signed)
 PT Cancellation Note  Patient Details Name: Jody Shaw MRN: [id number] DOB: [birth year]   Cancelled Treatment:    Reason Eval/Treat Not Completed: Fatigue/lethargy limiting ability to participate;Medical issues which prohibited therapy, RN reports patient has been anxious and has been given medication and is now finally resting. [name] PT Acute Rehabilitation Services Office [phone]    Potters Darice Norris 11/07/2024, 10:57 AM

## 2024-11-08 DIAGNOSIS — Z515 Encounter for palliative care: Secondary | ICD-10-CM

## 2024-11-08 DIAGNOSIS — T68XXXD Hypothermia, subsequent encounter: Secondary | ICD-10-CM | POA: Diagnosis not present

## 2024-11-08 DIAGNOSIS — R7881 Bacteremia: Secondary | ICD-10-CM | POA: Diagnosis not present

## 2024-11-08 DIAGNOSIS — I890 Lymphedema, not elsewhere classified: Secondary | ICD-10-CM | POA: Diagnosis not present

## 2024-11-08 DIAGNOSIS — I959 Hypotension, unspecified: Secondary | ICD-10-CM | POA: Diagnosis not present

## 2024-11-08 DIAGNOSIS — Z7189 Other specified counseling: Secondary | ICD-10-CM

## 2024-11-08 LAB — RENAL FUNCTION PANEL
Albumin: 2.5 g/dL — ABNORMAL LOW (ref 3.5–5.0)
Anion gap: 14 (ref 5–15)
BUN: 67 mg/dL — ABNORMAL HIGH (ref 8–23)
CO2: 20 mmol/L — ABNORMAL LOW (ref 22–32)
Calcium: 8.4 mg/dL — ABNORMAL LOW (ref 8.9–10.3)
Chloride: 112 mmol/L — ABNORMAL HIGH (ref 98–111)
Creatinine, Ser: 2.6 mg/dL — ABNORMAL HIGH (ref 0.44–1.00)
GFR, Estimated: 17 mL/min — ABNORMAL LOW
Glucose, Bld: 102 mg/dL — ABNORMAL HIGH (ref 70–99)
Phosphorus: 5 mg/dL — ABNORMAL HIGH (ref 2.5–4.6)
Potassium: 4 mmol/L (ref 3.5–5.1)
Sodium: 146 mmol/L — ABNORMAL HIGH (ref 135–145)

## 2024-11-08 LAB — CBC WITH DIFFERENTIAL/PLATELET
Abs Immature Granulocytes: 2.48 K/uL — ABNORMAL HIGH (ref 0.00–0.07)
Basophils Absolute: 0.1 K/uL (ref 0.0–0.1)
Basophils Relative: 0 %
Eosinophils Absolute: 0 K/uL (ref 0.0–0.5)
Eosinophils Relative: 0 %
HCT: 23.2 % — ABNORMAL LOW (ref 36.0–46.0)
Hemoglobin: 7.1 g/dL — ABNORMAL LOW (ref 12.0–15.0)
Immature Granulocytes: 9 %
Lymphocytes Relative: 1 %
Lymphs Abs: 0.4 K/uL — ABNORMAL LOW (ref 0.7–4.0)
MCH: 27.3 pg (ref 26.0–34.0)
MCHC: 30.6 g/dL (ref 30.0–36.0)
MCV: 89.2 fL (ref 80.0–100.0)
Monocytes Absolute: 3.3 K/uL — ABNORMAL HIGH (ref 0.1–1.0)
Monocytes Relative: 12 %
Neutro Abs: 20.5 K/uL — ABNORMAL HIGH (ref 1.7–7.7)
Neutrophils Relative %: 78 %
Platelets: 179 K/uL (ref 150–400)
RBC: 2.6 MIL/uL — ABNORMAL LOW (ref 3.87–5.11)
RDW: 19.5 % — ABNORMAL HIGH (ref 11.5–15.5)
WBC: 26.7 K/uL — ABNORMAL HIGH (ref 4.0–10.5)
nRBC: 0.2 % (ref 0.0–0.2)

## 2024-11-08 LAB — RESPIRATORY PANEL BY PCR

## 2024-11-08 LAB — MAGNESIUM: Magnesium: 2 mg/dL (ref 1.7–2.4)

## 2024-11-08 MED ORDER — ONDANSETRON HCL 4 MG/2ML IJ SOLN: 4.0000 mg | Freq: Four times a day (QID) | INTRAMUSCULAR | Status: DC | PRN

## 2024-11-08 MED ORDER — POLYVINYL ALCOHOL 1.4 % OP SOLN: 1.0000 [drp] | Freq: Four times a day (QID) | OPHTHALMIC | Status: DC | PRN

## 2024-11-08 MED ORDER — LORAZEPAM 2 MG/ML IJ SOLN: 1.0000 mg | INTRAMUSCULAR | Status: DC | PRN

## 2024-11-08 MED ORDER — ACETAMINOPHEN 650 MG RE SUPP: 650.0000 mg | Freq: Four times a day (QID) | RECTAL | Status: DC | PRN

## 2024-11-08 MED ORDER — HALOPERIDOL LACTATE 2 MG/ML PO CONC: 0.5000 mg | ORAL | Status: DC | PRN

## 2024-11-08 MED ORDER — LORAZEPAM 1 MG PO TABS: 1.0000 mg | ORAL_TABLET | ORAL | Status: DC | PRN

## 2024-11-08 MED ORDER — HYDROMORPHONE HCL 1 MG/ML IJ SOLN: 1.0000 mg | INTRAMUSCULAR | Status: DC | PRN

## 2024-11-08 MED ORDER — ACETAMINOPHEN 325 MG PO TABS: 650.0000 mg | ORAL_TABLET | Freq: Four times a day (QID) | ORAL | Status: DC | PRN

## 2024-11-08 MED ORDER — OXYCODONE HCL 20 MG/ML PO CONC: 5.0000 mg | ORAL | Status: DC | PRN

## 2024-11-08 MED ORDER — GLYCOPYRROLATE 1 MG PO TABS: 1.0000 mg | ORAL_TABLET | ORAL | Status: DC | PRN

## 2024-11-08 MED ORDER — HYDROMORPHONE HCL-NACL 50-0.9 MG/50ML-% IV SOLN
0.5000 mg/h | INTRAVENOUS | Status: DC
  Administered 2024-11-08: 0.5 mg/h via INTRAVENOUS
  Filled 2024-11-08: qty 50

## 2024-11-08 MED ORDER — HALOPERIDOL 0.5 MG PO TABS: 0.5000 mg | ORAL_TABLET | ORAL | Status: DC | PRN

## 2024-11-08 MED ORDER — HALOPERIDOL LACTATE 5 MG/ML IJ SOLN: 0.5000 mg | INTRAMUSCULAR | Status: DC | PRN

## 2024-11-08 MED ORDER — GLYCOPYRROLATE 0.2 MG/ML IJ SOLN: 0.2000 mg | INTRAMUSCULAR | Status: DC | PRN

## 2024-11-08 MED ORDER — ONDANSETRON 4 MG PO TBDP: 4.0000 mg | ORAL_TABLET | Freq: Four times a day (QID) | ORAL | Status: DC | PRN

## 2024-11-08 MED ORDER — LORAZEPAM 2 MG/ML PO CONC
1.0000 mg | ORAL | Status: DC | PRN
  Filled 2024-11-08: qty 0.5

## 2024-11-08 MED ORDER — BIOTENE DRY MOUTH MT LIQD: 15.0000 mL | OROMUCOSAL | Status: DC | PRN

## 2024-11-10 LAB — TYPE AND SCREEN
ABO/RH(D): A POS
Antibody Screen: NEGATIVE
Unit division: 0

## 2024-11-10 LAB — BPAM RBC
Blood Product Expiration Date: 202601242359
ISSUE DATE / TIME: 202512261107
Unit Type and Rh: 6200

## 2024-11-13 NOTE — Progress Notes (Signed)
 " PROGRESS NOTE  Jody Shaw FMW:[id number] DOB: 19-Jan-1941   PCP: Nichole Senior, MD  Patient is from: Home.  DOA: 10/29/2024 LOS: 10  Chief complaints Chief Complaint  Patient presents with   Weakness    Pt dx with PNA today @ PCP,  pt coming from PCP with c/o weakness- generalized, malaise, hallucination, abnormal lab with creatinine of 3. Pt reports illness x 1 week, a/o x4.       Brief Narrative / Interim history: [name] with giant cell arteritis, lymphedema, chronic leukocytosis, CAD presenting for weakness and evaluation of possible PNA, and found to have septic shock in the setting of MSSA bacteremia.  Noted to have Cr 2.76 (was 1.6 in 03/2024), WBC 13.3 and hypothermia. Rectal temps 91 and 93 degrees. Also noted to have hypotension with MAPs < 60 and requited pressors. Hospital course complicated by confusion. Eventually found to have MSSA bacteremia.  Repeat blood cultures NGTD.  TTE without vegetation.  Declined TEE.  ID recommended IV Ancef  for 6 weeks starting 10/31/2024.  Patient continued to decline despite antibiotics and other interventions.  Also developed respiratory distress.  After extensive goals of care discussion with patient's son and 2 daughters, she was transitioned to full comfort care on December 06, 2024.  Per family, patient stated I am ready to be with Lord.  Plan for transfer to residential hospice if she remains stable.  TOC consulted.   Subjective: Seen and examined earlier this morning.  Patient developed respiratory distress and panic attack last night.  She was given IV Lasix , IV morphine  and IV Haldol  overnight.  Per son, she stated I am ready to be with Lord.  Family likes to honor her wishes.  We have discussed about comfort care and use of medications such as morphine  or its derivatives and other medications for symptom management.  Family voiced understanding and okay to proceed with full comfort care.  Family okay with discontinuing antibiotics.  They are open to residential hospice if stable for transfer.   Assessment and plan: End-of-life care/full comfort care Advance care planning - Initiated on 12-06-24 after discussion with son and 2 daughters. - Comfort meds including IV Dilaudid , Ativan , glycopyrrolate , Haldol  ordered. - Appreciate help by palliative medicine - Chaplain consulted.  Septic shock, MSSA bacteremia: Present on admission.  Hypothermia, hypotension, leukocytosis, lactic acidosis and AKI.  Repeat blood cultures NGTD.  TTE without vegetation.  Reportedly declined TEE.  Sepsis physiology resolved except for leukocytosis.  Received IV Ancef  until 12/27. - Now full comfort care.  Left lung pneumonia: Reports shortness of breath, anxiety/panic attack and feeling bad.  No desaturation but on 2 L.  CXR raises concern for new left midlung and left lower base infiltrate with small left pleural effusion   Delirium: In the setting of the above.  Resolved.  Currently awake and alert and oriented appropriately.   AKI on CKD-3B: Cr was 1.6 in 03/2024.  Might have progressed to CKD-4.  Stable.  Normocytic anemia: H&H relatively stable.  Denies melena or hematochezia.  Transfused Blietz.  Hypotension/lethargy: Improved after p.o. midodrine  and increasing p.o. prednisone .    Chronic acquired lymphedema  History of giant cell arteritis   Anxiety and depression:    Generalized weakness  Leukocytosis:  Class II obesity Body mass index is 36.98 kg/m.         Pressure skin injury: Present on admission. Wound 10/30/24 Pressure Injury Buttocks Left Stage 3 -  Full thickness tissue loss. Subcutaneous fat may be visible  but bone, tendon or muscle are NOT exposed. (Active)     Wound 10/30/24 Pressure Injury Foot Left;Posterior Deep Tissue Pressure Injury - Purple or maroon localized area of discolored intact skin or blood-filled blister due to damage of underlying soft tissue from pressure and/or shear. (Active)   DVT  prophylaxis:  Patient is full comfort.  Code Status: DNR-comfort Family Communication: Updated patient's son and daughters Level of care: Palliative Care Status is: Inpatient Remains inpatient appropriate because: End-of-life care.   Final disposition: Residential hospice if stable for transfer in the next 24 to 48 hours   55 minutes with more than 50% spent in reviewing records, counseling patient/family and coordinating care.  Consultants:  Infectious disease Critical care Palliative care  Procedures: None  Microbiology summarized: 12/17-COVID-19, influenza and RSV PCR nonreactive 12/17-20 pathogen RVP nonreactive 12/17-MRSA PCR screen nonreactive 12/17-blood culture with MSSA 12/18-urine culture with insignificant growth 12/18-C. difficile negative 12/19-blood cultures NGTD  Objective: Vitals:   11/07/24 2101 11/07/24 2226 2024-11-24 0500 24-Nov-2024 0545  BP: 113/71 (!) 131/57  (!) 104/47  Pulse: 76 93  97  Resp: 20   17  Temp: 97.6 F (36.4 C) 97.7 F (36.5 C)  97.6 F (36.4 C)  TempSrc: Oral Axillary  Axillary  SpO2: 92% 98%  100%  Weight:   91.7 kg   Height:        Examination:  GENERAL: Sleepy.  Restless at times. NECK: Supple.  No apparent JVD.  RESP: Rhonchorous. CVS:  RRR. Heart sounds normal.  ABD/GI/GU: BS+. Abd soft, NTND.  MSK/EXT: Ace wrap over BLE. NEURO: Sleepy. PSYCH: Calm. Normal affect.   Sch Meds:  Scheduled Meds:   Continuous Infusions:  HYDROmorphone  0.5 mg/hr (2024-11-24 1205)   PRN Meds:.acetaminophen  **OR** acetaminophen , antiseptic oral rinse, artificial tears, glycopyrrolate  **OR** glycopyrrolate  **OR** glycopyrrolate , haloperidol  **OR** haloperidol  **OR** haloperidol  lactate, HYDROmorphone  (DILAUDID ) injection, LORazepam  **OR** LORazepam  **OR** LORazepam , ondansetron  **OR** ondansetron  (ZOFRAN ) IV  Antimicrobials: Anti-infectives (From admission, onward)    Start     Dose/Rate Route Frequency Ordered Stop   11/04/24 1000   ceFAZolin  (ANCEF ) IVPB 2g/100 mL premix  Status:  Discontinued        2 g 200 mL/hr over 30 Minutes Intravenous Every 24 hours 11/03/24 1033 11/03/24 1323   11/03/24 2200  ceFAZolin  (ANCEF ) IVPB 2g/100 mL premix  Status:  Discontinued        2 g 200 mL/hr over 30 Minutes Intravenous Every 12 hours 11/03/24 1323 24-Nov-2024 1101   10/30/24 1600  azithromycin  (ZITHROMAX ) 500 mg in sodium chloride  0.9 % 250 mL IVPB  Status:  Discontinued        500 mg 250 mL/hr over 60 Minutes Intravenous Every 24 hours 10/30/24 1225 10/30/24 1347   10/30/24 1400  cefTRIAXone  (ROCEPHIN ) 2 g in sodium chloride  0.9 % 100 mL IVPB  Status:  Discontinued        2 g 200 mL/hr over 30 Minutes Intravenous Every 24 hours 10/30/24 1225 10/30/24 1347   10/30/24 1400  ceFAZolin  (ANCEF ) IVPB 2g/100 mL premix  Status:  Discontinued        2 g 200 mL/hr over 30 Minutes Intravenous Every 8 hours 10/30/24 1347 10/30/24 1352   10/30/24 1400  ceFAZolin  (ANCEF ) IVPB 2g/100 mL premix  Status:  Discontinued        2 g 200 mL/hr over 30 Minutes Intravenous Every 12 hours 10/30/24 1352 11/03/24 1033   10/29/24 1445  cefTRIAXone  (ROCEPHIN ) 2 g in sodium chloride  0.9 % 100 mL  IVPB        2 g 200 mL/hr over 30 Minutes Intravenous Once 10/29/24 1430 10/29/24 1525   10/29/24 1445  azithromycin  (ZITHROMAX ) 500 mg in sodium chloride  0.9 % 250 mL IVPB        500 mg 250 mL/hr over 60 Minutes Intravenous  Once 10/29/24 1430 10/29/24 1629        I have personally reviewed the following labs and images: CBC: Recent Labs  Lab 11/03/24 0242 11/04/24 0300 11/05/24 0304 11/06/24 0538 11/07/24 0350 11/07/24 1530 Nov 25, 2024 0355  WBC 16.2* 15.4* 23.4* 24.3* 24.2*  --  26.7*  NEUTROABS 14.5* 13.6* 19.9* 20.6*  --   --  20.5*  HGB 7.4* 7.9* 8.0* 7.2* 6.2* 7.5* 7.1*  HCT 23.5* 25.3* 25.5* 23.6* 20.2* 24.0* 23.2*  MCV 84.8 87.2 86.1 87.4 87.8  --  89.2  PLT 182 168 162 198 171  --  179   BMP &GFR Recent Labs  Lab 11/04/24 0300  11/05/24 0304 11/06/24 0538 11/07/24 0350 11-25-24 0355  NA 141 142 144 146* 146*  K 4.8 4.2 4.6 4.2 4.0  CL 112* 110 112* 115* 112*  CO2 16* 16* 17* 20* 20*  GLUCOSE 104* 98 110* 101* 102*  BUN 55* 55* 53* 63* 67*  CREATININE 2.79* 2.74* 2.58* 2.53* 2.60*  CALCIUM  8.0* 8.2* 8.6* 8.3* 8.4*  MG 2.2 2.0 2.2 2.0 2.0  PHOS 5.0* 4.8* 4.9* 4.4 5.0*   Estimated Creatinine Clearance: 16.7 mL/min (A) (by C-G formula based on SCr of 2.6 mg/dL (H)). Liver & Pancreas: Recent Labs  Lab 11/06/24 0538 11/07/24 0350 11/25/24 0355  AST 11*  --   --   ALT 8  --   --   ALKPHOS 109  --   --   BILITOT <0.2  --   --   PROT 5.6*  --   --   ALBUMIN  2.4* 2.3* 2.5*   No results for input(s): LIPASE, AMYLASE in the last 168 hours. No results for input(s): AMMONIA in the last 168 hours. Diabetic: No results for input(s): HGBA1C in the last 72 hours. Recent Labs  Lab 11/07/24 0426 11/07/24 0810 11/07/24 1143 11/07/24 1629 11/07/24 2056  GLUCAP 91 86 123* 161* 133*   Cardiac Enzymes: No results for input(s): CKTOTAL, CKMB, CKMBINDEX, TROPONINI in the last 168 hours. Recent Labs    11/07/24 1530  PROBNP 15,919.0*   Coagulation Profile: No results for input(s): INR, PROTIME in the last 168 hours.  Thyroid Function Tests: No results for input(s): TSH, T4TOTAL, FREET4, T3FREE, THYROIDAB in the last 72 hours. Lipid Profile: No results for input(s): CHOL, HDL, LDLCALC, TRIG, CHOLHDL, LDLDIRECT in the last 72 hours. Anemia Panel: Recent Labs    11/06/24 1342  VITAMINB12 1,203*  FOLATE 5.3*  FERRITIN 243  TIBC 138*  IRON 29  RETICCTPCT 0.7   Urine analysis:    Component Value Date/Time   COLORURINE YELLOW 10/30/2024 0400   APPEARANCEUR HAZY (A) 10/30/2024 0400   LABSPEC 1.012 10/30/2024 0400   PHURINE 5.0 10/30/2024 0400   GLUCOSEU NEGATIVE 10/30/2024 0400   HGBUR NEGATIVE 10/30/2024 0400   BILIRUBINUR NEGATIVE 10/30/2024 0400    KETONESUR NEGATIVE 10/30/2024 0400   PROTEINUR NEGATIVE 10/30/2024 0400   NITRITE NEGATIVE 10/30/2024 0400   LEUKOCYTESUR SMALL (A) 10/30/2024 0400   Sepsis Labs: Invalid input(s): PROCALCITONIN, LACTICIDVEN  Microbiology: Recent Results (from the past 240 hours)  Resp panel by RT-PCR (RSV, Flu A&B, Covid)     Status: None  Collection Time: 10/29/24  2:31 PM   Specimen: Nasal Swab  Result Value Ref Range Status   SARS Coronavirus 2 by RT PCR NEGATIVE NEGATIVE Final    Comment: (NOTE) SARS-CoV-2 target nucleic acids are NOT DETECTED.  The SARS-CoV-2 RNA is generally detectable in upper respiratory specimens during the acute phase of infection. The lowest concentration of SARS-CoV-2 viral copies this assay can detect is 138 copies/mL. A negative result does not preclude SARS-Cov-2 infection and should not be used as the sole basis for treatment or other patient management decisions. A negative result may occur with  improper specimen collection/handling, submission of specimen other than nasopharyngeal swab, presence of viral mutation(s) within the areas targeted by this assay, and inadequate number of viral copies(<138 copies/mL). A negative result must be combined with clinical observations, patient history, and epidemiological information. The expected result is Negative.  Fact Sheet for Patients:  bloggercourse.com  Fact Sheet for Healthcare Providers:  seriousbroker.it  This test is no t yet approved or cleared by the [place]  FDA and  has been authorized for detection and/or diagnosis of SARS-CoV-2 by FDA under an Emergency Use Authorization (EUA). This EUA will remain  in effect (meaning this test can be used) for the duration of the COVID-19 declaration under Section 564(b)(1) of the Act, 21 U.S.C.section 360bbb-3(b)(1), unless the authorization is terminated  or revoked sooner.       Influenza A by PCR  NEGATIVE NEGATIVE Final   Influenza B by PCR NEGATIVE NEGATIVE Final    Comment: (NOTE) The Xpert Xpress SARS-CoV-2/FLU/RSV plus assay is intended as an aid in the diagnosis of influenza from Nasopharyngeal swab specimens and should not be used as a sole basis for treatment. Nasal washings and aspirates are unacceptable for Xpert Xpress SARS-CoV-2/FLU/RSV testing.  Fact Sheet for Patients: bloggercourse.com  Fact Sheet for Healthcare Providers: seriousbroker.it  This test is not yet approved or cleared by the [place]  FDA and has been authorized for detection and/or diagnosis of SARS-CoV-2 by FDA under an Emergency Use Authorization (EUA). This EUA will remain in effect (meaning this test can be used) for the duration of the COVID-19 declaration under Section 564(b)(1) of the Act, 21 U.S.C. section 360bbb-3(b)(1), unless the authorization is terminated or revoked.     Resp Syncytial Virus by PCR NEGATIVE NEGATIVE Final    Comment: (NOTE) Fact Sheet for Patients: bloggercourse.com  Fact Sheet for Healthcare Providers: seriousbroker.it  This test is not yet approved or cleared by the [place]  FDA and has been authorized for detection and/or diagnosis of SARS-CoV-2 by FDA under an Emergency Use Authorization (EUA). This EUA will remain in effect (meaning this test can be used) for the duration of the COVID-19 declaration under Section 564(b)(1) of the Act, 21 U.S.C. section 360bbb-3(b)(1), unless the authorization is terminated or revoked.  Performed at [facility], [address] [place], [place] [address]   Respiratory (~20 pathogens) panel by PCR     Status: None   Collection Time: 10/29/24  2:31 PM   Specimen: Nasopharyngeal Swab; Respiratory  Result Value Ref Range Status   Adenovirus NOT DETECTED NOT DETECTED Final   Coronavirus 229E NOT  DETECTED NOT DETECTED Final    Comment: (NOTE) The Coronavirus on the Respiratory Panel, DOES NOT test for the novel  Coronavirus (2019 nCoV)    Coronavirus HKU1 NOT DETECTED NOT DETECTED Final   Coronavirus NL63 NOT DETECTED NOT DETECTED Final   Coronavirus OC43 NOT DETECTED NOT DETECTED Final  Metapneumovirus NOT DETECTED NOT DETECTED Final   Rhinovirus / Enterovirus NOT DETECTED NOT DETECTED Final   Influenza A NOT DETECTED NOT DETECTED Final   Influenza B NOT DETECTED NOT DETECTED Final   Parainfluenza Virus 1 NOT DETECTED NOT DETECTED Final   Parainfluenza Virus 2 NOT DETECTED NOT DETECTED Final   Parainfluenza Virus 3 NOT DETECTED NOT DETECTED Final   Parainfluenza Virus 4 NOT DETECTED NOT DETECTED Final   Respiratory Syncytial Virus NOT DETECTED NOT DETECTED Final   Bordetella pertussis NOT DETECTED NOT DETECTED Final   Bordetella Parapertussis NOT DETECTED NOT DETECTED Final   Chlamydophila pneumoniae NOT DETECTED NOT DETECTED Final   Mycoplasma pneumoniae NOT DETECTED NOT DETECTED Final    Comment: Performed at [facility], [address], [place], [place] [address]  Blood Culture (routine x 2)     Status: Abnormal   Collection Time: 10/29/24  3:25 PM   Specimen: BLOOD  Result Value Ref Range Status   Specimen Description   Final    BLOOD SITE NOT SPECIFIED Performed at [facility], [address] [place], [place] [address]    Special Requests   Final    BOTTLES DRAWN AEROBIC AND ANAEROBIC Blood Culture results may not be optimal due to an inadequate volume of blood received in culture bottles Performed at [facility], [address], [place], [place] [address]    Culture  Setup Time   Final    GRAM POSITIVE COCCI IN CLUSTERS IN BOTH AEROBIC AND ANAEROBIC BOTTLES CRITICAL VALUE NOTED.  VALUE IS CONSISTENT WITH PREVIOUSLY REPORTED AND CALLED VALUE.    Culture (A)  Final    STAPHYLOCOCCUS AUREUS SUSCEPTIBILITIES  PERFORMED ON PREVIOUS CULTURE WITHIN THE LAST 5 DAYS. Performed at [facility], [address], [place], [place] [address]    Report Status 11/01/2024 FINAL  Final  Blood Culture (routine x 2)     Status: Abnormal   Collection Time: 10/29/24  3:40 PM   Specimen: BLOOD  Result Value Ref Range Status   Specimen Description   Final    BLOOD SITE NOT SPECIFIED Performed at [facility], [address], [place], [place] [address]    Special Requests   Final    BOTTLES DRAWN AEROBIC AND ANAEROBIC Blood Culture results may not be optimal due to an inadequate volume of blood received in culture bottles Performed at [facility], [address], [place], [place] [address]    Culture  Setup Time   Final    GRAM POSITIVE COCCI IN CLUSTERS IN BOTH AEROBIC AND ANAEROBIC BOTTLES CRITICAL RESULT CALLED TO, READ BACK BY AND VERIFIED WITH: MAYA LOUANN LABOR 1327 [id number] FCP Performed at [facility], [address], [place], [place] [address]    Culture STAPHYLOCOCCUS AUREUS (A)  Final   Report Status 11/01/2024 FINAL  Final   Organism ID, Bacteria STAPHYLOCOCCUS AUREUS  Final      Susceptibility   Staphylococcus aureus - MIC*    CIPROFLOXACIN <=0.5 SENSITIVE Sensitive     ERYTHROMYCIN >=8 RESISTANT Resistant     GENTAMICIN >=16 RESISTANT Resistant     OXACILLIN 0.5 SENSITIVE Sensitive     TETRACYCLINE <=1 SENSITIVE Sensitive     VANCOMYCIN  1 SENSITIVE Sensitive     TRIMETH/SULFA <=10 SENSITIVE Sensitive     CLINDAMYCIN <=0.25 SENSITIVE Sensitive     RIFAMPIN <=0.5 SENSITIVE Sensitive     Inducible Clindamycin NEGATIVE Sensitive  LINEZOLID 2 SENSITIVE Sensitive     * STAPHYLOCOCCUS AUREUS  Blood Culture ID Panel (Reflexed)     Status: Abnormal   Collection Time: 10/29/24  3:40 PM  Result Value Ref Range Status   Enterococcus faecalis NOT DETECTED NOT DETECTED Final   Enterococcus Faecium NOT DETECTED NOT DETECTED Final   Listeria  monocytogenes NOT DETECTED NOT DETECTED Final   Staphylococcus species DETECTED (A) NOT DETECTED Final    Comment: CRITICAL RESULT CALLED TO, READ BACK BY AND VERIFIED WITH: PHARMD UTOMWEN, A 1327 [id number] FCP    Staphylococcus aureus (BCID) DETECTED (A) NOT DETECTED Final    Comment: CRITICAL RESULT CALLED TO, READ BACK BY AND VERIFIED WITH: PHARMD UTOMWEN, A 1327 [id number] FCP    Staphylococcus epidermidis NOT DETECTED NOT DETECTED Final   Staphylococcus lugdunensis NOT DETECTED NOT DETECTED Final   Streptococcus species NOT DETECTED NOT DETECTED Final   Streptococcus agalactiae NOT DETECTED NOT DETECTED Final   Streptococcus pneumoniae NOT DETECTED NOT DETECTED Final   Streptococcus pyogenes NOT DETECTED NOT DETECTED Final   A.calcoaceticus-baumannii NOT DETECTED NOT DETECTED Final   Bacteroides fragilis NOT DETECTED NOT DETECTED Final   Enterobacterales NOT DETECTED NOT DETECTED Final   Enterobacter cloacae complex NOT DETECTED NOT DETECTED Final   Escherichia coli NOT DETECTED NOT DETECTED Final   Klebsiella aerogenes NOT DETECTED NOT DETECTED Final   Klebsiella oxytoca NOT DETECTED NOT DETECTED Final   Klebsiella pneumoniae NOT DETECTED NOT DETECTED Final   Proteus species NOT DETECTED NOT DETECTED Final   Salmonella species NOT DETECTED NOT DETECTED Final   Serratia marcescens NOT DETECTED NOT DETECTED Final   Haemophilus influenzae NOT DETECTED NOT DETECTED Final   Neisseria meningitidis NOT DETECTED NOT DETECTED Final   Pseudomonas aeruginosa NOT DETECTED NOT DETECTED Final   Stenotrophomonas maltophilia NOT DETECTED NOT DETECTED Final   Candida albicans NOT DETECTED NOT DETECTED Final   Candida auris NOT DETECTED NOT DETECTED Final   Candida glabrata NOT DETECTED NOT DETECTED Final   Candida krusei NOT DETECTED NOT DETECTED Final   Candida parapsilosis NOT DETECTED NOT DETECTED Final   Candida tropicalis NOT DETECTED NOT DETECTED Final   Cryptococcus neoformans/gattii NOT  DETECTED NOT DETECTED Final   Meth resistant mecA/C and MREJ NOT DETECTED NOT DETECTED Final    Comment: Performed at [facility], [address] [place], [place] [address]  MRSA Next Gen by PCR, Nasal     Status: None   Collection Time: 10/29/24 11:12 PM   Specimen: Nasal Mucosa; Nasal Swab  Result Value Ref Range Status   MRSA by PCR Next Gen NOT DETECTED NOT DETECTED Final    Comment: (NOTE) The GeneXpert MRSA Assay (FDA approved for NASAL specimens only), is one component of a comprehensive MRSA colonization surveillance program. It is not intended to diagnose MRSA infection nor to guide or monitor treatment for MRSA infections. Test performance is not FDA approved in patients less than 74 years old. Performed at [facility], [address], [place], [place] [address]   Urine Culture     Status: Abnormal   Collection Time: 10/30/24  4:00 AM   Specimen: Urine, Random  Result Value Ref Range Status   Specimen Description   Final    URINE, RANDOM Performed at [facility], [address], [place], [place] [address]    Special Requests   Final    NONE Reflexed from [id number] Performed at [facility], [address] [place],  [place] [address]    Culture (A)  Final    <10,000 COLONIES/mL INSIGNIFICANT GROWTH Performed at [facility], [address] [place], [place] [address]    Report Status 10/31/2024 FINAL  Final  C Difficile Quick Screen w PCR reflex     Status: None   Collection Time: 10/30/24  4:44 PM   Specimen: STOOL  Result Value Ref Range Status   C Diff antigen NEGATIVE NEGATIVE Final   C Diff toxin NEGATIVE NEGATIVE Final   C Diff interpretation No C. difficile detected.  Final    Comment: Performed at [facility], [address] [place], [place] [address]  Gastrointestinal Panel by PCR , Stool     Status: None   Collection Time: 10/30/24  4:45 PM   Specimen: STOOL   Result Value Ref Range Status   Campylobacter species NOT DETECTED NOT DETECTED Final   Plesimonas shigelloides NOT DETECTED NOT DETECTED Final   Salmonella species NOT DETECTED NOT DETECTED Final   Yersinia enterocolitica NOT DETECTED NOT DETECTED Final   Vibrio species NOT DETECTED NOT DETECTED Final   Vibrio cholerae NOT DETECTED NOT DETECTED Final   Enteroaggregative E coli (EAEC) NOT DETECTED NOT DETECTED Final   Enteropathogenic E coli (EPEC) NOT DETECTED NOT DETECTED Final   Enterotoxigenic E coli (ETEC) NOT DETECTED NOT DETECTED Final   Shiga like toxin producing E coli (STEC) NOT DETECTED NOT DETECTED Final   Shigella/Enteroinvasive E coli (EIEC) NOT DETECTED NOT DETECTED Final   Cryptosporidium NOT DETECTED NOT DETECTED Final   Cyclospora cayetanensis NOT DETECTED NOT DETECTED Final   Entamoeba histolytica NOT DETECTED NOT DETECTED Final   Giardia lamblia NOT DETECTED NOT DETECTED Final   Adenovirus F40/41 NOT DETECTED NOT DETECTED Final   Astrovirus NOT DETECTED NOT DETECTED Final   Norovirus GI/GII NOT DETECTED NOT DETECTED Final   Rotavirus A NOT DETECTED NOT DETECTED Final   Sapovirus (I, II, IV, and V) NOT DETECTED NOT DETECTED Final    Comment: Performed at [facility], [address] [place], [place] [address]  Culture, blood (Routine X 2) w Reflex to ID Panel     Status: None   Collection Time: 10/31/24  3:11 AM   Specimen: BLOOD  Result Value Ref Range Status   Specimen Description   Final    BLOOD SITE NOT SPECIFIED Performed at [facility], [address], [place], [place] [address]    Special Requests   Final    BOTTLES DRAWN AEROBIC ONLY Blood Culture results may not be optimal due to an inadequate volume of blood received in culture bottles Performed at [facility], [address] [place], [place] [address]    Culture   Final    NO GROWTH 5 DAYS Performed at [facility], [address], [place], [place]  [address]    Report Status 11/05/2024 FINAL  Final  Culture, blood (Routine X 2) w Reflex to ID Panel     Status: None   Collection Time: 10/31/24  3:11 AM   Specimen: BLOOD LEFT HAND  Result Value Ref Range Status   Specimen Description   Final    BLOOD LEFT HAND Performed at [facility], [address], [place], [place] [address]    Special Requests   Final    BOTTLES DRAWN AEROBIC ONLY Blood Culture results may not be optimal due to an inadequate volume of blood received in culture bottles Performed at  [facility], [address], [place], [place] [address]    Culture   Final    NO GROWTH 5 DAYS Performed at [facility], [address] [place], [place] [address]    Report Status 11/05/2024 FINAL  Final  SARS Coronavirus 2 by RT PCR (hospital order, performed in [place] hospital lab) *cepheid single result test* Anterior Nasal Swab     Status: None   Collection Time: 11/07/24  1:04 PM   Specimen: Anterior Nasal Swab  Result Value Ref Range Status   SARS Coronavirus 2 by RT PCR NEGATIVE NEGATIVE Final    Comment: (NOTE) SARS-CoV-2 target nucleic acids are NOT DETECTED.  The SARS-CoV-2 RNA is generally detectable in upper and lower respiratory specimens during the acute phase of infection. The lowest concentration of SARS-CoV-2 viral copies this assay can detect is 250 copies / mL. A negative result does not preclude SARS-CoV-2 infection and should not be used as the sole basis for treatment or other patient management decisions.  A negative result may occur with improper specimen collection / handling, submission of specimen other than nasopharyngeal swab, presence of viral mutation(s) within the areas targeted by this assay, and inadequate number of viral copies (<250 copies / mL). A negative result must be combined with clinical observations, patient history, and epidemiological information.  Fact Sheet for Patients:    roadlaptop.co.za  Fact Sheet for Healthcare Providers: http://kim-miller.com/  This test is not yet approved or  cleared by the [place]  FDA and has been authorized for detection and/or diagnosis of SARS-CoV-2 by FDA under an Emergency Use Authorization (EUA).  This EUA will remain in effect (meaning this test can be used) for the duration of the COVID-19 declaration under Section 564(b)(1) of the Act, 21 U.S.C. section 360bbb-3(b)(1), unless the authorization is terminated or revoked sooner.  Performed at [facility], [address], [place], [place] [address]   Respiratory (~20 pathogens) panel by PCR     Status: None   Collection Time: 11/07/24  1:04 PM   Specimen: Nasopharyngeal Swab; Respiratory  Result Value Ref Range Status   Adenovirus NOT DETECTED NOT DETECTED Final   Coronavirus 229E NOT DETECTED NOT DETECTED Final    Comment: (NOTE) The Coronavirus on the Respiratory Panel, DOES NOT test for the novel  Coronavirus (2019 nCoV)    Coronavirus HKU1 NOT DETECTED NOT DETECTED Final   Coronavirus NL63 NOT DETECTED NOT DETECTED Final   Coronavirus OC43 NOT DETECTED NOT DETECTED Final   Metapneumovirus NOT DETECTED NOT DETECTED Final   Rhinovirus / Enterovirus NOT DETECTED NOT DETECTED Final   Influenza A NOT DETECTED NOT DETECTED Final   Influenza B NOT DETECTED NOT DETECTED Final   Parainfluenza Virus 1 NOT DETECTED NOT DETECTED Final   Parainfluenza Virus 2 NOT DETECTED NOT DETECTED Final   Parainfluenza Virus 3 NOT DETECTED NOT DETECTED Final   Parainfluenza Virus 4 NOT DETECTED NOT DETECTED Final   Respiratory Syncytial Virus NOT DETECTED NOT DETECTED Final   Bordetella pertussis NOT DETECTED NOT DETECTED Final   Bordetella Parapertussis NOT DETECTED NOT DETECTED Final   Chlamydophila pneumoniae NOT DETECTED NOT DETECTED Final   Mycoplasma pneumoniae NOT DETECTED NOT DETECTED Final    Comment:  Performed at [facility], [address] [place], [place] [address]    Radiology Studies: No results found.     Beryle Zeitz T. Oseias Horsey [facility]  If 7PM-7AM, please contact night-coverage [email or web address] Nov 29, 2024, 1:40 PM   "

## 2024-11-13 NOTE — Progress Notes (Signed)
" ° ° °  Patient Name: Jody Shaw           DOB: 09/17/1941  MRN: [id number]       OVERNIGHT EVENT    Notified by RN that patient has expired at 2030.  2 RN verified. Patient was comfort care.   Family is at bedside.     [name], DNP, ACNPC- AG [facility] [place]  "

## 2024-11-13 NOTE — Progress Notes (Signed)
" °  Daily Progress Note   Patient Name: Jody Shaw       Date: 11-24-2024 DOB: Jun 10, 1941  Age: 84 y.o. MRN#: [id number] Attending Physician: Kathrin Mignon DASEN, MD Primary Care Physician: Nichole Senior, MD Admit Date: 10/29/2024 Length of Stay: 10 days  Reason for Consultation/Follow-up: Establishing goals of care  Subjective:   Reviewed EMR including recent documentation from [facility] MD In resp distress Now on comfort care.   Objective:   Vital Signs:  BP (!) 104/47 (BP Location: Left Arm)   Pulse 97   Temp 97.6 F (36.4 C) (Axillary)   Resp 17   Ht 5' 2 (1.575 m)   Wt 91.7 kg   SpO2 100%   BMI 36.98 kg/m   Physical Exam: General: NAD, chronically ill-appearing, awake but in mod to severe resp distress Cardiovascular: RRR Respiratory:  increased work of breathing noted,  in respiratory distress Abdomen: not distended Neuro: alert, awake  Assessment & Plan:   Assessment: Patient is an 84 year old female with a past medical history of CAD, lymphedema, CKD stage IIIb, anemia, GERD, hypothyroidism, giant cell arteritis, hypothyroidism, HTN, and HLD who was sent to the ED by her PCP on 10/29/2024 for evaluation of possible pneumonia and elevated creatinine.  Chest x-ray at PCPs office showed pneumonia however repeat chest x-ray and CT chest on admission with no acute abnormalities. Patient is admitted with severe sepsis secondary to UTI, concern for pneumonia, lactic acidosis, and AKI on CKD. Palliative Medicine has been consulted for goals of care discussions and complex medical decision making.   Recommendations/Plan: # Complex medical decision making/goals of care:  - comfort care   -  Code Status: Do not attempt resuscitation (DNR) - Comfort care  #Symptom management Monitor for pain and non-pain symptoms. Start dilaudid  drip  # Psychosocial Support:  - Daughter-Joanne, Richardson who live locally, son Lemond who has arrived from [place] .   # Discharge Planning: SNF  rehab with palliative.   Discussed with: Patient, patient's son Lemond, bedside RN, daughter  Thank you for allowing the palliative care team to participate in the care Kamilya A Perreira. High MDM Lonia Serve MD.  Palliative Care Provider PMT # [phone]  If patient remains symptomatic despite maximum doses, please call PMT at [phone] between 0700 and 1900. Outside of these hours, please call attending, as PMT does not have night coverage.   "

## 2024-11-13 NOTE — Death Summary Note (Signed)
 "  DEATH SUMMARY   Patient Details  Name: Jody Shaw MRN: [id number] DOB: 09/12/1941 ERE:Dnluy, Garnette, MD Admission/Discharge Information   Admit Date:  10/30/24  Date of Death: Date of Death: 09-Nov-2024  Time of Death: Time of Death: 11/18/28  Length of Stay: 11/23/2024   Principle Cause of death: Septic shock due to MSSA bacteremia  Hospital Diagnoses:  Hospital Course: [name] with giant cell arteritis, lymphedema, chronic leukocytosis, CAD presenting for weakness and evaluation of possible PNA, and found to have septic shock in the setting of MSSA bacteremia.  Noted to have Cr 2.76 (was 1.6 in 03/2024), WBC 13.3 and hypothermia. Rectal temps 91 and 93 degrees. Also noted to have hypotension with MAPs < 60 and requited pressors. Hospital course complicated by confusion. Eventually found to have MSSA bacteremia.  Repeat blood cultures NGTD.  TTE without vegetation.  Declined TEE.  ID recommended IV Ancef  for 6 weeks starting 10/31/2024.   Patient continued to decline despite antibiotics and other interventions.  Also developed respiratory distress.   Per family, patient stated I am ready to be with Lord.  After extensive goals of care discussion with patient's son and 2 daughters, she was transitioned to full comfort care on 11-09-2024 and passed away the same evening at 8:30 PM..   Assessment and Plan: End-of-life care/full comfort care Advance care planning - Initiated on 2024-11-09 after discussion with son and 2 daughters. - Comfort meds including IV Dilaudid , Ativan , glycopyrrolate , Haldol  ordered. - Appreciate help by palliative medicine - Chaplain consulted.   Septic shock, MSSA bacteremia: Present on admission.  Hypothermia, hypotension, leukocytosis, lactic acidosis and AKI.  Repeat blood cultures NGTD.  TTE without vegetation.  Reportedly declined TEE.  Sepsis physiology resolved except for leukocytosis.  Received IV Ancef  until 09-Nov-2025. - Now full comfort care.   Left  lung pneumonia: Reports shortness of breath, anxiety/panic attack and feeling bad.  No desaturation but on 2 L.  CXR raises concern for new left midlung and left lower base infiltrate with small left pleural effusion   Delirium: In the setting of the above.  Resolved.  Currently awake and alert and oriented appropriately.   AKI on CKD-3B: Cr was 1.6 in 03/2024.  Might have progressed to CKD-4.  Stable.   Normocytic anemia: H&H relatively stable.  Denies melena or hematochezia.  Transfused Blietz.   Hypotension/lethargy: Improved after p.o. midodrine  and increasing p.o. prednisone .    Chronic acquired lymphedema   History of giant cell arteritis   Anxiety and depression:    Generalized weakness   Leukocytosis:   Class II obesity   Procedures: None   Consultations: Infectious disease, critical care and palliative medicine  The results of significant diagnostics from this hospitalization (including imaging, microbiology, ancillary and laboratory) are listed below for reference.   Significant Diagnostic Studies: DG Chest Port 1 View Result Date: 11/07/2024 CLINICAL DATA:  Shortness of breath. EXAM: PORTABLE CHEST 1 VIEW COMPARISON:  10/30/2024 FINDINGS: Cardiopericardial silhouette is at upper limits of normal for size. New airspace disease in the left mid lung and left lung base is suspicious for pneumonia. Small left pleural effusion evident. Right lung relatively clear. Right PICC line tip overlies the lower SVC level. IMPRESSION: 1. New airspace disease in the left mid lung and left lung base suspicious for pneumonia. 2. Small left pleural effusion. Electronically Signed   By: Camellia Candle M.D.   On: 11/07/2024 11:14   US  EKG SITE RITE Result Date: 11/03/2024 If Site  Rite image not attached, placement could not be confirmed due to current cardiac rhythm.  CT HEAD WO CONTRAST ( ) Result Date: 11/02/2024 EXAM: CT HEAD WITHOUT CONTRAST 11/02/2024 05:22:23 AM TECHNIQUE: CT of  the head was performed without the administration of intravenous contrast. Automated exposure control, iterative reconstruction, and/or weight based adjustment of the mA/kV was utilized to reduce the radiation dose to as low as reasonably achievable. COMPARISON: 03/24/2024 CLINICAL HISTORY: Anisocoria FINDINGS: BRAIN AND VENTRICLES: No acute hemorrhage. No evidence of acute infarct. Nonspecific periventricular and subcortical white matter hypoattenuation, consistent with chronic microvascular ischemic changes. Mild parenchymal volume loss. No hydrocephalus. No extra-axial collection. No mass effect or midline shift. Atherosclerotic calcifications of carotid siphons and intracranial vertebral arteries. ORBITS: Bilateral lens replacement noted. SINUSES: Mild mucosal thickening in ethmoid sinuses. Partial opacification of bilateral mastoid air cells. SOFT TISSUES AND SKULL: No acute soft tissue abnormality. No skull fracture. IMPRESSION: 1. No acute intracranial abnormality related to anisocoria. 2. Nonspecific periventricular and subcortical white matter hypoattenuation, consistent with chronic microvascular ischemic changes. 3. Mild parenchymal volume loss. 4. Mild ethmoid sinus mucosal thickening and partial opacification of the bilateral mastoid air cells. 5. Intracranial atherosclerotic calcifications. Electronically signed by: Evalene Coho MD 11/02/2024 05:47 AM EST RP Workstation: [id number]   ECHOCARDIOGRAM COMPLETE Result Date: 10/31/2024    ECHOCARDIOGRAM REPORT   Patient Name:   Jody Shaw Date of Exam: 10/31/2024 Medical Rec #:  [id number]        Height:       62.0 in Accession #:    [id number]       Weight:       201.9 lb Date of Birth:  1941-07-21        BSA:          1.920 m Patient Age:    83 years         BP:           92/61 mmHg Patient Gender: F                HR:           67 bpm. Exam Location:  Inpatient Procedure: 2D Echo, Cardiac Doppler and Color Doppler (Both Spectral and Color             Flow Doppler were utilized during procedure). Indications:    Bacteremia R78.81  History:        Patient has no prior history of Echocardiogram examinations.                 CAD.  Sonographer:    Jayson Gaskins Referring Phys: [id number] TRUNG T VU IMPRESSIONS  1. Left ventricular ejection fraction, by estimation, is 65 to 70%. The left ventricle has normal function. The left ventricle has no regional wall motion abnormalities. Left ventricular diastolic parameters are consistent with Grade II diastolic dysfunction (pseudonormalization). Elevated left atrial pressure.  2. Right ventricular systolic function is normal. The right ventricular size is normal.  3. The mitral valve is degenerative. Trivial mitral valve regurgitation. Mild to moderate mitral stenosis. Moderate MS by gradients (MG at 65bpm), mild by MVA (1.9cm^2 by continuity equation)  4. The aortic valve was not well visualized. Aortic valve regurgitation is not visualized. No aortic stenosis is present. Conclusion(s)/Recommendation(s): No evidence of valvular vegetations on this transthoracic echocardiogram, but tehcnically difficult study. Consider a transesophageal echocardiogram to exclude infective endocarditis if clinically indicated. FINDINGS  Left Ventricle: Left ventricular ejection fraction, by estimation, is 65 to 70%.  The left ventricle has normal function. The left ventricle has no regional wall motion abnormalities. The left ventricular internal cavity size was small. There is no left ventricular hypertrophy. Left ventricular diastolic parameters are consistent with Grade II diastolic dysfunction (pseudonormalization). Elevated left atrial pressure. Right Ventricle: The right ventricular size is normal. No increase in right ventricular wall thickness. Right ventricular systolic function is normal. Left Atrium: Left atrial size was normal in size. Right Atrium: Right atrial size was normal in size. Pericardium: There is no evidence  of pericardial effusion. Mitral Valve: The mitral valve is degenerative in appearance. Moderate mitral annular calcification. Trivial mitral valve regurgitation. Mild to moderate mitral valve stenosis. MV peak gradient, 10.5 mmHg. The mean mitral valve gradient is 6.0 mmHg. Tricuspid Valve: The tricuspid valve is normal in structure. Tricuspid valve regurgitation is trivial. Aortic Valve: The aortic valve was not well visualized. Aortic valve regurgitation is not visualized. No aortic stenosis is present. Aortic valve mean gradient measures 5.0 mmHg. Aortic valve peak gradient measures 8.9 mmHg. Aortic valve area, by VTI measures 1.83 cm. Pulmonic Valve: The pulmonic valve was not well visualized. Pulmonic valve regurgitation is trivial. Aorta: The aortic root was not well visualized. IAS/Shunts: The interatrial septum was not well visualized.  LEFT VENTRICLE PLAX 2D LVIDd:         3.30 cm   Diastology LVIDs:         2.30 cm   LV e' medial:    7.18 cm/s LV PW:         0.90 cm   LV E/e' medial:  18.9 LV IVS:        0.90 cm   LV e' lateral:   7.29 cm/s LVOT diam:     1.70 cm   LV E/e' lateral: 18.7 LV SV:         73 LV SV Index:   38 LVOT Area:     2.27 cm LV IVRT:       102 msec  RIGHT VENTRICLE RV S prime:     9.25 cm/s TAPSE (M-mode): 1.5 cm LEFT ATRIUM             Index        RIGHT ATRIUM           Index LA Vol (A2C):   38.8 ml 20.21 ml/m  RA Area:     13.30 cm LA Vol (A4C):   43.4 ml 22.61 ml/m  RA Volume:   28.30 ml  14.74 ml/m LA Biplane Vol: 41.4 ml 21.57 ml/m  AORTIC VALVE AV Area (Vmax):    1.95 cm AV Area (Vmean):   1.72 cm AV Area (VTI):     1.83 cm AV Vmax:           149.00 cm/s AV Vmean:          107.000 cm/s AV VTI:            0.400 m AV Peak Grad:      8.9 mmHg AV Mean Grad:      5.0 mmHg LVOT Vmax:         128.00 cm/s LVOT Vmean:        81.200 cm/s LVOT VTI:          0.323 m LVOT/AV VTI ratio: 0.81 MITRAL VALVE                TRICUSPID VALVE MV Area (PHT): 2.97 cm     TR Peak grad:  28.9  mmHg MV Area VTI:   1.86 cm     TR Vmax:        269.00 cm/s MV Peak grad:  10.5 mmHg MV Mean grad:  6.0 mmHg     SHUNTS MV Vmax:       1.62 m/s     Systemic VTI:  0.32 m MV Vmean:      112.0 cm/s   Systemic Diam: 1.70 cm MV E velocity: 136.00 cm/s MV A velocity: 159.00 cm/s MV E/A ratio:  0.86 Lonni Nanas MD Electronically signed by Lonni Nanas MD Signature Date/Time: 10/31/2024/10:51:09 AM    Final    CT Chest Wo Contrast Result Date: 10/29/2024 CLINICAL DATA:  Nontraumatic chest wall pain. Concern for inflammatory/infectious etiology. EXAM: CT CHEST WITHOUT CONTRAST TECHNIQUE: Multidetector CT imaging of the chest was performed following the standard protocol without IV contrast. RADIATION DOSE REDUCTION: This exam was performed according to the departmental dose-optimization program which includes automated exposure control, adjustment of the mA and/or kV according to patient size and/or use of iterative reconstruction technique. COMPARISON:  Chest radiograph dated 10/29/2024. FINDINGS: Evaluation of this exam is limited in the absence of intravenous contrast. Cardiovascular: There is no cardiomegaly or pericardial effusion. There is coronary vascular calcification and calcification of the mitral annulus. Moderate atherosclerotic calcification of the thoracic aorta. No aneurysmal dilatation. The central pulmonary arteries are grossly unremarkable. Mediastinum/Nodes: No hilar or mediastinal adenopathy. Moderate size hiatal hernia. The esophagus is grossly unremarkable no mediastinal fluid collection. Lungs/Pleura: Bibasilar subpleural atelectasis. Biapical subpleural linear scarring. No consolidative changes. There is no pleural effusion pneumothorax. The central airways are patent. Upper Abdomen: No acute abnormality. Musculoskeletal: Osteopenia with degenerative changes of the spine. No acute osseous pathology. No fluid collection. IMPRESSION: 1. No acute intrathoracic pathology. 2.  Moderate size hiatal hernia. 3.  Aortic Atherosclerosis (ICD10-I70.0). Electronically Signed   By: Vanetta Chou M.D.   On: 10/29/2024 17:01   DG Chest Port 1 View Result Date: 10/29/2024 EXAM: 1 VIEW(S) XRAY OF THE CHEST 10/29/2024 02:46:00 PM COMPARISON: Comparison with 03/24/2024. CLINICAL HISTORY: Questionable sepsis - evaluate for abnormality. FINDINGS: LUNGS AND PLEURA: Shallow inspiration with elevation of the right hemidiaphragm. Mild linear atelectasis or scarring in the lung bases. No airspace disease or consolidation. No pleural effusion or pneumothorax. HEART AND MEDIASTINUM: Moderate-sized hiatal hernia behind the heart. Heart size and pulmonary vascularity are normal. Mediastinal contours appear intact. BONES AND SOFT TISSUES: Degenerative changes in the spine and shoulders. No acute osseous abnormality. IMPRESSION: 1. No acute cardiopulmonary abnormality. Electronically signed by: Elsie Gravely MD 10/29/2024 03:17 PM EST RP Workstation: [id number]    Microbiology: Recent Results (from the past 240 hours)  SARS Coronavirus 2 by RT PCR (hospital order, performed in [facility] hospital lab) *cepheid single result test* Anterior Nasal Swab     Status: None   Collection Time: 11/07/24  1:04 PM   Specimen: Anterior Nasal Swab  Result Value Ref Range Status   SARS Coronavirus 2 by RT PCR NEGATIVE NEGATIVE Final    Comment: (NOTE) SARS-CoV-2 target nucleic acids are NOT DETECTED.  The SARS-CoV-2 RNA is generally detectable in upper and lower respiratory specimens during the acute phase of infection. The lowest concentration of SARS-CoV-2 viral copies this assay can detect is 250 copies / mL. A negative result does not preclude SARS-CoV-2 infection and should not be used as the sole basis for treatment or other patient management decisions.  A negative result may occur with improper specimen collection /  handling, submission of specimen other than nasopharyngeal swab, presence of  viral mutation(s) within the areas targeted by this assay, and inadequate number of viral copies (<250 copies / mL). A negative result must be combined with clinical observations, patient history, and epidemiological information.  Fact Sheet for Patients:   roadlaptop.co.za  Fact Sheet for Healthcare Providers: http://kim-miller.com/  This test is not yet approved or  cleared by the [place]  FDA and has been authorized for detection and/or diagnosis of SARS-CoV-2 by FDA under an Emergency Use Authorization (EUA).  This EUA will remain in effect (meaning this test can be used) for the duration of the COVID-19 declaration under Section 564(b)(1) of the Act, 21 U.S.C. section 360bbb-3(b)(1), unless the authorization is terminated or revoked sooner.  Performed at [facility], [address], [place], [place] [address]   Respiratory (~20 pathogens) panel by PCR     Status: None   Collection Time: 11/07/24  1:04 PM   Specimen: Nasopharyngeal Swab; Respiratory  Result Value Ref Range Status   Adenovirus NOT DETECTED NOT DETECTED Final   Coronavirus 229E NOT DETECTED NOT DETECTED Final    Comment: (NOTE) The Coronavirus on the Respiratory Panel, DOES NOT test for the novel  Coronavirus (2019 nCoV)    Coronavirus HKU1 NOT DETECTED NOT DETECTED Final   Coronavirus NL63 NOT DETECTED NOT DETECTED Final   Coronavirus OC43 NOT DETECTED NOT DETECTED Final   Metapneumovirus NOT DETECTED NOT DETECTED Final   Rhinovirus / Enterovirus NOT DETECTED NOT DETECTED Final   Influenza A NOT DETECTED NOT DETECTED Final   Influenza B NOT DETECTED NOT DETECTED Final   Parainfluenza Virus 1 NOT DETECTED NOT DETECTED Final   Parainfluenza Virus 2 NOT DETECTED NOT DETECTED Final   Parainfluenza Virus 3 NOT DETECTED NOT DETECTED Final   Parainfluenza Virus 4 NOT DETECTED NOT DETECTED Final   Respiratory Syncytial Virus NOT DETECTED NOT  DETECTED Final   Bordetella pertussis NOT DETECTED NOT DETECTED Final   Bordetella Parapertussis NOT DETECTED NOT DETECTED Final   Chlamydophila pneumoniae NOT DETECTED NOT DETECTED Final   Mycoplasma pneumoniae NOT DETECTED NOT DETECTED Final    Comment: Performed at [facility], [address] [place], [place] [address]    Time spent: 35 minutes  Signed: Mignon ONEIDA Bump, MD 11/17/2024   "

## 2024-11-13 NOTE — Plan of Care (Signed)
Pt changed to comfort care

## 2024-11-13 NOTE — Progress Notes (Signed)
 I met with Jody Shaw's family at bedside to provide grief support through prayer and facilitating sharing of stories and memories. I had previously met Jody Shaw when she was first admitted and was able to share with the family that even at that time, she was focused on being reunited with her husband who died.  The family plans to spend time with her.  If other needs arise, please page us  at [phone].

## 2024-11-13 NOTE — Progress Notes (Signed)
 Jody Shaw [address]  [facility] Liaison Note   Received request for family interest in hospice inpatient unit. Plan to visit patient and speak with family on tomorrow. ICM notified.     Thank you for allowing us  to participate in this patient's care.   Nat Babe, BSN, RN [employer] [phone]

## 2024-11-13 NOTE — Progress Notes (Signed)
" ° ° ° °  Patient Name: Jody Shaw           DOB: 07/08/1941  MRN: [id number]      Admission Date: 10/29/2024  Attending Provider: Kathrin Mignon DASEN, MD  Primary Diagnosis: Septic shock (HCC)   Level of care: Progressive   OVERNIGHT EVENT   Patient was admitted for septic shock related to MSSA bacteremia and is receiving a 6-week IV Ancef  treatment per ID recommendation. Chest x-ray completed earlier today 12/26- found suspicion for left lung pneumonia with left pleural effusion.  Hemoglobin was 6.2 earlier this morning, received 1 unit PRBC.  Called to bedside, pt having intermittent WOB without hypoxia.  Minimal relief with neb treatment.   Family expresses concerns for her level of comfort as she continues to have recurrent episodes of anxiety and shortness of breath.  They feel like she has worsening over the past 24 hours.  Poor relief with IV Ativan  per family at bedside.   Plan: IV lasix  IV morphine  prn IV haldol  prn Monitor hemoglobin Palliative care following    Asar Evilsizer, DNP, ACNPC- AG [facility] [place]    "

## 2024-11-13 NOTE — TOC Progression Note (Signed)
 Transition of Care [facility]) - Progression Note    Patient Details  Name: Jody Shaw MRN: [id number] Date of Birth: 06-03-1941  Transition [facility] CM/SW Contact  Sonda Manuella Quill, RN Phone Number: Nov 15, 2024, 3:44 PM  Clinical Narrative:    IP CM consulted for residential hospice; spoke w/ pt's dtr Richardson Kays and family at bedside; she was given list of residential hospice facilities; Ms Kays said family has selected [facility]; Nat Babe, Hospital Liaison notified via secure chat; IP CM following.   Expected Discharge Plan: Skilled Nursing Facility Barriers to Discharge: Continued Medical Work up               Expected Discharge Plan and Services In-house Referral: Hospice / Palliative Care Discharge Planning Services: CM Consult Post Acute Care Choice: Durable Medical Equipment Living arrangements for the past 2 months: Single Family Home                 DME Arranged: N/A DME Agency: NA       HH Arranged: NA HH Agency: NA         Social Drivers of Health (SDOH) Interventions SDOH Screenings   Food Insecurity: No Food Insecurity (11/01/2024)  Housing: Low Risk (11/01/2024)  Transportation Needs: No Transportation Needs (11/01/2024)  Utilities: Not At Risk (11/01/2024)  Financial Resource Strain: Low Risk (10/16/2023)   Received from [facility]  Social Connections: Socially Isolated (11/02/2024)  Tobacco Use: Medium Risk (11/04/2024)    Readmission Risk Interventions    11/03/2024   12:03 PM 10/31/2024    2:41 PM 03/25/2024    2:11 PM  Readmission Risk Prevention Plan  Transportation Screening Complete Complete Complete  PCP or Specialist Appt within 5-7 Days Complete Complete Complete  Home Care Screening Complete Complete Complete  Medication Review (RN CM) Complete Complete Complete

## 2024-11-13 DEATH — deceased

## 2024-12-03 ENCOUNTER — Inpatient Hospital Stay: Payer: Self-pay | Admitting: Internal Medicine
